# Patient Record
Sex: Male | Born: 1970 | Race: Black or African American | Hispanic: No | Marital: Married | Smoking: Never smoker
Health system: Southern US, Community
[De-identification: ages and names within clinical notes are randomized; demographics above are authoritative.]

## PROBLEM LIST (undated history)

## (undated) DIAGNOSIS — I3139 Other pericardial effusion (noninflammatory): Secondary | ICD-10-CM

## (undated) DIAGNOSIS — K219 Gastro-esophageal reflux disease without esophagitis: Secondary | ICD-10-CM

## (undated) DIAGNOSIS — I5020 Unspecified systolic (congestive) heart failure: Secondary | ICD-10-CM

## (undated) DIAGNOSIS — I313 Pericardial effusion (noninflammatory): Secondary | ICD-10-CM

## (undated) DIAGNOSIS — J45909 Unspecified asthma, uncomplicated: Secondary | ICD-10-CM

## (undated) HISTORY — DX: Unspecified asthma, uncomplicated: J45.909

## (undated) HISTORY — PX: OTHER SURGICAL HISTORY: SHX169

---

## 2020-01-06 ENCOUNTER — Ambulatory Visit: Payer: Self-pay | Admitting: Nurse Practitioner

## 2020-01-15 ENCOUNTER — Other Ambulatory Visit: Payer: Self-pay | Admitting: Nurse Practitioner

## 2020-01-15 ENCOUNTER — Other Ambulatory Visit: Payer: Self-pay

## 2020-01-15 ENCOUNTER — Ambulatory Visit (INDEPENDENT_AMBULATORY_CARE_PROVIDER_SITE_OTHER): Payer: Managed Care, Other (non HMO) | Admitting: Nurse Practitioner

## 2020-01-15 ENCOUNTER — Encounter: Payer: Self-pay | Admitting: Nurse Practitioner

## 2020-01-15 ENCOUNTER — Telehealth: Payer: Self-pay

## 2020-01-15 DIAGNOSIS — Z0000 Encounter for general adult medical examination without abnormal findings: Secondary | ICD-10-CM | POA: Insufficient documentation

## 2020-01-15 DIAGNOSIS — Z139 Encounter for screening, unspecified: Secondary | ICD-10-CM

## 2020-01-15 DIAGNOSIS — Z7689 Persons encountering health services in other specified circumstances: Secondary | ICD-10-CM | POA: Diagnosis not present

## 2020-01-15 DIAGNOSIS — R000 Tachycardia, unspecified: Secondary | ICD-10-CM

## 2020-01-15 DIAGNOSIS — R609 Edema, unspecified: Secondary | ICD-10-CM | POA: Diagnosis not present

## 2020-01-15 MED ORDER — FUROSEMIDE 20 MG PO TABS: 20.0000 mg | ORAL_TABLET | Freq: Every day | ORAL | 0 refills | Status: DC

## 2020-01-15 NOTE — Assessment & Plan Note (Signed)
-  will get labs including HCV and HIV screening prior to next visit

## 2020-01-15 NOTE — Assessment & Plan Note (Signed)
-  will set up for colonoscopy

## 2020-01-15 NOTE — Progress Notes (Signed)
New Patient Office Visit  Subjective:  Patient ID: Craig Hanson, male    DOB: 1970-04-21  Age: 49 y.o. MRN: [id number]  CC:  Chief Complaint  Patient presents with  . Establish Care  . Leg Swelling    For the past few weeks especially worse in the evening after work and goes down overnight but then comes back   . Shortness of Breath    States since the 2nd covid vaccine he does give out of breath more easily when walking     HPI Craig Hanson presents for new patient visit. No recent PCP. No recent labs or physical. He had second COVID shot on 11/18/19, and he states he has had fatigue with exertion as well as leg swelling since then. His BP is elevated today. He had scrotal edema, and today he has pitting edema up to his mid thigh.  No acute distress or chest pain today, no episode of chest pain.   Past Medical History:  Diagnosis Date  . Asthma     History reviewed. No pertinent surgical history.  Family History  Problem Relation Age of Onset  . Hypertension Mother   . Heart disease Father     Social History   Socioeconomic History  . Marital status: Married    Spouse name: Not on file  . Number of children: Not on file  . Years of education: Not on file  . 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 Smoking status: Never Smoker  . Smokeless tobacco: Never Used  Vaping Use  . Vaping Use: Never used  Substance and Sexual Activity  . Alcohol use: Yes    Comment: 1 gallon of vodka lasts 2 weeks  . Drug use: Never  . Sexual activity: Yes    Birth control/protection: None  Other Topics Concern  . Not on file  Social History Narrative  . Not on file   Social Determinants of Health   Financial Resource Strain: Not on file  Food Insecurity: Not on file  Transportation Needs: Not on file  Physical Activity: Not on file  Stress: Not on file  Social Connections: Not  on file  Intimate Partner Violence: Not on file    ROS Review of Systems  Constitutional: Negative.   Respiratory: Negative.        SOB with exertion; no acute distress today  Cardiovascular: Positive for leg swelling. Negative for chest pain and palpitations.       Swelling from his feet to his scrotum  Musculoskeletal: Negative.   Psychiatric/Behavioral: Negative.     Objective:   Today's Vitals: BP (!) 145/97   Pulse (!) 123   Resp 16   Ht $R'5\' 8"'SM$  (1.727 m)   Wt 242 lb (109.8 kg)   SpO2 98%   BMI 36.80 kg/m   Physical Exam Constitutional:      Appearance: He is obese.  Cardiovascular:     Rate and Rhythm: Regular rhythm. Tachycardia present.     Comments: Left leg is weeping from a small abrasion. Pulmonary:     Effort: Pulmonary effort is normal.     Breath sounds: Normal breath sounds.  Musculoskeletal:     Right lower leg: No tenderness. Edema present.     Left lower leg: No tenderness. Edema present.  Neurological:     Mental Status: He is alert.     Assessment & Plan:  Problem List Items Addressed This Visit      Other   Encounter to establish care    -will get labs including HCV and HIV screening prior to next visit      Relevant Orders   CBC with Differential/Platelet   CMP14+EGFR   HCV Ab w/Rflx to Verification   HIV Antibody (routine testing w rflx)   Lipid Panel With LDL/HDL Ratio   TSH + free T4   3+ pitting edema    -to bilateral legs up to mid thigh -unsure of etiology- cardiac vs renal -BP elevated today -Rx. Lasix -will get labs and perform physical next week      Screening due    -will set up for colonoscopy      Relevant Orders   Ambulatory referral to Gastroenterology   HCV Ab w/Rflx to Verification   HIV Antibody (routine testing w rflx)   Tachycardia    -HR 123 today -has edema, no crackles auscultated -Rx. Lasix, re-evaluate at time of physcial -if he has any distress, report to ED      Relevant Orders   TSH +  free T4      Outpatient Encounter Medications as of 01/15/2020  Medication Sig  . furosemide (LASIX) 20 MG tablet Take 1 tablet (20 mg total) by mouth daily.   No facility-administered encounter medications on file as of 01/15/2020.    Follow-up: Return in about 1 week (around 01/22/2020) for Physical Exam.   Noreene Larsson, NP

## 2020-01-15 NOTE — Assessment & Plan Note (Addendum)
-  HR 123 today -has edema, no crackles auscultated -Rx. Lasix, re-evaluate at time of physcial -if he has any distress, report to ED -EKG today showed sinus tachycardia and possible left atrial enlargement -cardiology consulted

## 2020-01-15 NOTE — Assessment & Plan Note (Signed)
-  to bilateral legs up to mid thigh -unsure of etiology- cardiac vs renal -BP elevated today -Rx. Lasix -will get labs and perform physical next week

## 2020-01-15 NOTE — Patient Instructions (Addendum)
For your new patient visit, we set you up with routine labs.  For leg swelling, I prescribed furosemide to remove some fluid.  We will use the labs to decide our next steps. The EKG

## 2020-01-15 NOTE — Telephone Encounter (Signed)
Needs meds sent to [facility].   [facility] [place]    Furosemide 20 mg

## 2020-01-19 ENCOUNTER — Encounter: Payer: Self-pay | Admitting: Internal Medicine

## 2020-01-19 NOTE — Telephone Encounter (Signed)
Patient said he wanted med to go to mail order. [facility]

## 2020-01-22 ENCOUNTER — Ambulatory Visit (INDEPENDENT_AMBULATORY_CARE_PROVIDER_SITE_OTHER): Payer: Managed Care, Other (non HMO) | Admitting: Nurse Practitioner

## 2020-01-22 ENCOUNTER — Encounter: Payer: Self-pay | Admitting: Nurse Practitioner

## 2020-01-22 ENCOUNTER — Other Ambulatory Visit: Payer: Self-pay

## 2020-01-22 DIAGNOSIS — R000 Tachycardia, unspecified: Secondary | ICD-10-CM

## 2020-01-22 DIAGNOSIS — R609 Edema, unspecified: Secondary | ICD-10-CM | POA: Diagnosis not present

## 2020-01-22 DIAGNOSIS — Z0000 Encounter for general adult medical examination without abnormal findings: Secondary | ICD-10-CM | POA: Diagnosis not present

## 2020-01-22 MED ORDER — POTASSIUM CHLORIDE CRYS ER 20 MEQ PO TBCR: 20.0000 meq | EXTENDED_RELEASE_TABLET | Freq: Every day | ORAL | 3 refills | Status: DC

## 2020-01-22 MED ORDER — FUROSEMIDE 40 MG PO TABS: 40.0000 mg | ORAL_TABLET | Freq: Every day | ORAL | 3 refills | Status: DC

## 2020-01-22 NOTE — Patient Instructions (Signed)
We will meet back up in 2 weeks to check on your leg swelling.

## 2020-01-22 NOTE — Progress Notes (Signed)
Established Patient Office Visit  Subjective:  Patient ID: Craig Hanson, male    DOB: 01/16/1971  Age: 50 y.o. MRN: [id number]  CC:  Chief Complaint  Patient presents with  . Annual Exam    HPI Craig Hanson presents for yearly physical exam.  He states that his edema has improved some, but it is still present.  He was referred to GI and cardiology at last OV.  He had his labs drawn today.  Past Medical History:  Diagnosis Date  . Asthma     History reviewed. No pertinent surgical history.  Family History  Problem Relation Age of Onset  . Hypertension Mother   . Heart disease Father     Social History   Socioeconomic History  . Marital status: Married    Spouse name: Not on file  . Number of children: Not on file  . Years of education: Not on file  . 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 Smoking status: Never Smoker  . Smokeless tobacco: Never Used  Vaping Use  . Vaping Use: Never used  Substance and Sexual Activity  . Alcohol use: Yes    Comment: 1 gallon of vodka lasts 2 weeks  . Drug use: Never  . Sexual activity: Yes    Birth control/protection: None  Other Topics Concern  . Not on file  Social History Narrative  . Not on file   Social Determinants of Health   Financial Resource Strain: Not on file  Food Insecurity: Not on file  Transportation Needs: Not on file  Physical Activity: Not on file  Stress: Not on file  Social Connections: Not on file  Intimate Partner Violence: Not on file    Outpatient Medications Prior to Visit  Medication Sig Dispense Refill  . furosemide (LASIX) 20 MG tablet Take 1 tablet (20 mg total) by mouth daily. 14 tablet 0   No facility-administered medications prior to visit.    Not on File  ROS Review of Systems  Constitutional: Positive for fatigue. Negative for chills and fever.  HENT: Negative.   Eyes: Negative.    Respiratory: Positive for shortness of breath. Negative for cough, chest tightness and wheezing.        SOB with exertion  Cardiovascular: Positive for leg swelling. Negative for chest pain and palpitations.  Gastrointestinal: Positive for abdominal distention. Negative for anal bleeding, blood in stool, constipation, diarrhea, nausea and vomiting.  Endocrine: Negative.   Genitourinary: Positive for scrotal swelling.  Skin: Negative.   Allergic/Immunologic: Negative.   Neurological: Negative.   Hematological: Negative.   Psychiatric/Behavioral: Negative.       Objective:    Physical Exam Constitutional:      Appearance: He is obese.  HENT:     Head: Normocephalic and atraumatic.     Right Ear: Tympanic membrane, ear canal and external ear normal.     Left Ear: Tympanic membrane, ear canal and external ear normal.     Nose: Nose normal.     Mouth/Throat:     Mouth: Mucous membranes are dry.     Pharynx: Oropharynx is clear.     Comments: Wears upper dentures Eyes:     Extraocular Movements: Extraocular movements intact.     Conjunctiva/sclera: Conjunctivae normal.     Pupils: Pupils are equal, round, and reactive to light.  Cardiovascular:     Rate and Rhythm: Normal rate and  regular rhythm.     Pulses: Normal pulses.     Heart sounds: Normal heart sounds.  Pulmonary:     Effort: Pulmonary effort is normal.     Breath sounds: Normal breath sounds.  Abdominal:     General: Abdomen is flat. Bowel sounds are normal.     Palpations: Abdomen is soft.  Musculoskeletal:        General: Normal range of motion.     Cervical back: Normal range of motion and neck supple.  Skin:    General: Skin is warm and dry.     Capillary Refill: Capillary refill takes less than 2 seconds.  Neurological:     General: No focal deficit present.     Mental Status: He is alert and oriented to person, place, and time.  Psychiatric:        Mood and Affect: Mood normal.        Behavior:  Behavior normal.        Thought Content: Thought content normal.        Judgment: Judgment normal.     BP (!) 148/86   Pulse (!) 126   Temp 98.4 F (36.9 C)   Resp (!) 22   Ht $R'5\' 8"'Fz$  (1.727 m)   Wt 253 lb (114.8 kg)   SpO2 93%   BMI 38.47 kg/m  Wt Readings from Last 3 Encounters:  01/22/20 253 lb (114.8 kg)  01/15/20 242 lb (109.8 kg)     Health Maintenance Due  Topic Date Due  . Hepatitis C Screening  Never done  . HIV Screening  Never done  . COLONOSCOPY (Pts 45-48yrs Insurance coverage will need to be confirmed)  Never done  . INFLUENZA VACCINE  Never done    There are no preventive care reminders to display for this patient.  No results found for: TSH No results found for: WBC, HGB, HCT, MCV, PLT No results found for: NA, K, CHLORIDE, CO2, GLUCOSE, BUN, CREATININE, BILITOT, ALKPHOS, AST, ALT, PROT, ALBUMIN, CALCIUM, ANIONGAP, EGFR, GFR No results found for: CHOL No results found for: HDL No results found for: LDLCALC No results found for: TRIG No results found for: CHOLHDL No results found for: HGBA1C    Assessment & Plan:   Problem List Items Addressed This Visit      Other   Routine physical examination    -had his labs drawn today -has upcoming colonoscopy      3+ pitting edema    -still has 3+ edema to BLE; 2+ pittign to thighs and abdomen -INCREASE lasix to 40 mEq per day -Rx. K -f/u in 2 weeks      Tachycardia    -likely related to his edema -referred to cardiology at last OV -will check thyroid with next set of labs         Meds ordered this encounter  Medications  . furosemide (LASIX) 40 MG tablet    Sig: Take 1 tablet (40 mg total) by mouth daily.    Dispense:  30 tablet    Refill:  3  . potassium chloride SA (KLOR-CON) 20 MEQ tablet    Sig: Take 1 tablet (20 mEq total) by mouth daily.    Dispense:  30 tablet    Refill:  3    Follow-up: Return in about 2 weeks (around 02/05/2020) for Medication check (lasix).    Noreene Larsson, NP

## 2020-01-22 NOTE — Assessment & Plan Note (Signed)
-  still has 3+ edema to BLE; 2+ pittign to thighs and abdomen -INCREASE lasix to 40 mEq per day -Rx. K -f/u in 2 weeks

## 2020-01-22 NOTE — Assessment & Plan Note (Addendum)
-  likely related to his edema -referred to cardiology at last OV -will check thyroid with next set of labs

## 2020-01-22 NOTE — Assessment & Plan Note (Signed)
-  had his labs drawn today -has upcoming colonoscopy

## 2020-01-23 LAB — TSH+FREE T4
Free T4: 1.26 ng/dL (ref 0.82–1.77)
TSH: 3.73 u[IU]/mL (ref 0.450–4.500)

## 2020-01-23 LAB — CMP14+EGFR
ALT: 69 IU/L — ABNORMAL HIGH (ref 0–44)
AST: 56 IU/L — ABNORMAL HIGH (ref 0–40)
Albumin/Globulin Ratio: 1.4 (ref 1.2–2.2)
Albumin: 3.8 g/dL (ref 3.8–4.9)
Alkaline Phosphatase: 135 IU/L — ABNORMAL HIGH (ref 44–121)
BUN/Creatinine Ratio: 11 (ref 9–20)
BUN: 15 mg/dL (ref 6–24)
Bilirubin Total: 1.1 mg/dL (ref 0.0–1.2)
CO2: 21 mmol/L (ref 20–29)
Calcium: 9.2 mg/dL (ref 8.7–10.2)
Chloride: 99 mmol/L (ref 96–106)
Creatinine, Ser: 1.33 mg/dL — ABNORMAL HIGH (ref 0.76–1.27)
GFR calc Af Amer: 71 mL/min/{1.73_m2} (ref >59)
GFR calc non Af Amer: 61 mL/min/{1.73_m2} (ref >59)
Globulin, Total: 2.8 g/dL (ref 1.5–4.5)
Glucose: 111 mg/dL — ABNORMAL HIGH (ref 65–99)
Potassium: 4.6 mmol/L (ref 3.5–5.2)
Sodium: 140 mmol/L (ref 134–144)
Total Protein: 6.6 g/dL (ref 6.0–8.5)

## 2020-01-23 LAB — LIPID PANEL WITH LDL/HDL RATIO
Cholesterol, Total: 150 mg/dL (ref 100–199)
HDL: 38 mg/dL — ABNORMAL LOW (ref >39)
LDL Chol Calc (NIH): 96 mg/dL (ref 0–99)
LDL/HDL Ratio: 2.5 ratio (ref 0.0–3.6)
Triglycerides: 84 mg/dL (ref 0–149)
VLDL Cholesterol Cal: 16 mg/dL (ref 5–40)

## 2020-01-23 LAB — HCV AB W/RFLX TO VERIFICATION: HCV Ab: <0.1 s/co ratio (ref 0.0–0.9)

## 2020-01-23 LAB — CBC WITH DIFFERENTIAL/PLATELET
Basophils Absolute: 0 10*3/uL (ref 0.0–0.2)
Basos: 0 %
EOS (ABSOLUTE): 0 10*3/uL (ref 0.0–0.4)
Eos: 0 %
Hematocrit: 43.3 % (ref 37.5–51.0)
Hemoglobin: 14.4 g/dL (ref 13.0–17.7)
Immature Grans (Abs): 0 10*3/uL (ref 0.0–0.1)
Immature Granulocytes: 0 %
Lymphocytes Absolute: 1.2 10*3/uL (ref 0.7–3.1)
Lymphs: 12 %
MCH: 30.8 pg (ref 26.6–33.0)
MCHC: 33.3 g/dL (ref 31.5–35.7)
MCV: 93 fL (ref 79–97)
Monocytes Absolute: 0.9 10*3/uL (ref 0.1–0.9)
Monocytes: 9 %
Neutrophils Absolute: 7.9 10*3/uL — ABNORMAL HIGH (ref 1.4–7.0)
Neutrophils: 79 %
Platelets: 292 10*3/uL (ref 150–450)
RBC: 4.67 x10E6/uL (ref 4.14–5.80)
RDW: 13.9 % (ref 11.6–15.4)
WBC: 10.1 10*3/uL (ref 3.4–10.8)

## 2020-01-23 LAB — HIV ANTIBODY (ROUTINE TESTING W REFLEX): HIV Screen 4th Generation wRfx: NONREACTIVE

## 2020-01-23 LAB — HCV INTERPRETATION

## 2020-01-23 NOTE — Progress Notes (Signed)
We'll discuss this at his next OV.

## 2020-02-05 ENCOUNTER — Ambulatory Visit (INDEPENDENT_AMBULATORY_CARE_PROVIDER_SITE_OTHER): Payer: Managed Care, Other (non HMO) | Admitting: Nurse Practitioner

## 2020-02-05 ENCOUNTER — Encounter: Payer: Self-pay | Admitting: Nurse Practitioner

## 2020-02-05 ENCOUNTER — Other Ambulatory Visit: Payer: Self-pay

## 2020-02-05 DIAGNOSIS — R609 Edema, unspecified: Secondary | ICD-10-CM

## 2020-02-05 MED ORDER — POTASSIUM CHLORIDE CRYS ER 20 MEQ PO TBCR: 20.0000 meq | EXTENDED_RELEASE_TABLET | Freq: Two times a day (BID) | ORAL | 0 refills | Status: DC

## 2020-02-05 MED ORDER — FUROSEMIDE 40 MG PO TABS: 40.0000 mg | ORAL_TABLET | Freq: Two times a day (BID) | ORAL | 0 refills | Status: DC

## 2020-02-05 NOTE — Patient Instructions (Signed)
We will meet back up in 2 weeks to recheck your edema. We will draw labs at that time to recheck your electrolytes and kidney function.

## 2020-02-05 NOTE — Assessment & Plan Note (Signed)
-  his pitting edema is still 3+; his left leg is weeping -INCREASED lasix to 40 mg BID -he will see cardiology tomorrow -Suspect heart failure given decent renal function -will defer to them for ACEi/ARB and or Beta-blocker since he will see them tomorrow

## 2020-02-05 NOTE — Progress Notes (Signed)
Established Patient Office Visit  Subjective:  Patient ID: Craig Hanson, male    DOB: 10-29-1970  Age: 50 y.o. MRN: [id number]  CC:  Chief Complaint  Patient presents with  . Edema  . Follow-up  . Shortness of Breath    HPI RAVEN HARMES presents for bilateral leg edema. His swelling is about the same as his previous visit. He has been taking furosemide as prescribed and states his urine output has increased.  Past Medical History:  Diagnosis Date  . Asthma     History reviewed. No pertinent surgical history.  Family History  Problem Relation Age of Onset  . Hypertension Mother   . Heart disease Father     Social History   Socioeconomic History  . Marital status: Married    Spouse name: Not on file  . Number of children: Not on file  . Years of education: Not on file  . 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 Smoking status: Never Smoker  . Smokeless tobacco: Never Used  Vaping Use  . Vaping Use: Never used  Substance and Sexual Activity  . Alcohol use: Yes    Comment: 1 gallon of vodka lasts 2 weeks  . Drug use: Never  . Sexual activity: Yes    Birth control/protection: None  Other Topics Concern  . Not on file  Social History Narrative  . Not on file   Social Determinants of Health   Financial Resource Strain: Not on file  Food Insecurity: Not on file  Transportation Needs: Not on file  Physical Activity: Not on file  Stress: Not on file  Social Connections: Not on file  Intimate Partner Violence: Not on file    Outpatient Medications Prior to Visit  Medication Sig Dispense Refill  . furosemide (LASIX) 40 MG tablet Take 1 tablet (40 mg total) by mouth daily. 30 tablet 3  . potassium chloride SA (KLOR-CON) 20 MEQ tablet Take 1 tablet (20 mEq total) by mouth daily. 30 tablet 3   No facility-administered medications prior to visit.    Not on  File  ROS Review of Systems    Objective:    Physical Exam  BP (!) 125/91   Pulse (!) 125   Temp 98.4 F (36.9 C)   Resp (!) 22   Ht 5\' 8"  (1.727 m)   Wt 231 lb (104.8 kg)   SpO2 95%   BMI 35.12 kg/m  Wt Readings from Last 3 Encounters:  02/05/20 231 lb (104.8 kg)  01/22/20 253 lb (114.8 kg)  01/15/20 242 lb (109.8 kg)     Health Maintenance Due  Topic Date Due  . COLONOSCOPY (Pts 45-91yrs Insurance coverage will need to be confirmed)  Never done  . INFLUENZA VACCINE  Never done    There are no preventive care reminders to display for this patient.  Lab Results  Component Value Date   TSH 3.730 01/22/2020   Lab Results  Component Value Date   WBC 10.1 01/22/2020   HGB 14.4 01/22/2020   HCT 43.3 01/22/2020   MCV 93 01/22/2020   PLT 292 01/22/2020   Lab Results  Component Value Date   NA 140 01/22/2020   K 4.6 01/22/2020   CO2 21 01/22/2020   GLUCOSE 111 (H) 01/22/2020   BUN 15 01/22/2020   CREATININE 1.33 (H) 01/22/2020   BILITOT 1.1 01/22/2020   ALKPHOS 135 (H)  01/22/2020   AST 56 (H) 01/22/2020   ALT 69 (H) 01/22/2020   PROT 6.6 01/22/2020   ALBUMIN 3.8 01/22/2020   CALCIUM 9.2 01/22/2020   Lab Results  Component Value Date   CHOL 150 01/22/2020   Lab Results  Component Value Date   HDL 38 (L) 01/22/2020   Lab Results  Component Value Date   LDLCALC 96 01/22/2020   Lab Results  Component Value Date   TRIG 84 01/22/2020   No results found for: CHOLHDL No results found for: EZMO2H    Assessment & Plan:   Problem List Items Addressed This Visit      Other   3+ pitting edema    -his pitting edema is still 3+; his left leg is weeping -INCREASED lasix to 40 mg BID -he will see cardiology tomorrow -Suspect heart failure given decent renal function -will defer to them for ACEi/ARB and or Beta-blocker since he will see them tomorrow      Relevant Orders   Basic Metabolic Panel (BMET)      Meds ordered this encounter   Medications  . furosemide (LASIX) 40 MG tablet    Sig: Take 1 tablet (40 mg total) by mouth 2 (two) times daily.    Dispense:  30 tablet    Refill:  0  . potassium chloride SA (KLOR-CON) 20 MEQ tablet    Sig: Take 1 tablet (20 mEq total) by mouth 2 (two) times daily. Administer medication when taking furosemide    Dispense:  30 tablet    Refill:  0    Follow-up: Return in about 2 weeks (around 02/19/2020) for Edema follow-up.    Heather Roberts, NP

## 2020-02-06 ENCOUNTER — Inpatient Hospital Stay (HOSPITAL_COMMUNITY)
Admission: AD | Admit: 2020-02-06 | Discharge: 2020-02-20 | DRG: 286 | Disposition: A | Discharge status: Home / Self Care | Payer: Managed Care, Other (non HMO) | Source: Ambulatory Visit | Attending: Internal Medicine | Admitting: Internal Medicine

## 2020-02-06 ENCOUNTER — Encounter (HOSPITAL_COMMUNITY): Payer: Self-pay | Admitting: Internal Medicine

## 2020-02-06 ENCOUNTER — Encounter: Payer: Self-pay | Admitting: Cardiology

## 2020-02-06 ENCOUNTER — Ambulatory Visit: Payer: Managed Care, Other (non HMO) | Admitting: Cardiology

## 2020-02-06 ENCOUNTER — Other Ambulatory Visit: Payer: Self-pay

## 2020-02-06 ENCOUNTER — Other Ambulatory Visit (HOSPITAL_COMMUNITY)
Admission: RE | Admit: 2020-02-06 | Discharge: 2020-02-06 | Disposition: A | Discharge status: Home / Self Care | Payer: Managed Care, Other (non HMO) | Source: Other Acute Inpatient Hospital | Attending: Family Medicine | Admitting: Family Medicine

## 2020-02-06 VITALS — BP 124/84 | HR 126 | Ht 68.0 in | Wt 247.0 lb

## 2020-02-06 DIAGNOSIS — I340 Nonrheumatic mitral (valve) insufficiency: Secondary | ICD-10-CM | POA: Diagnosis not present

## 2020-02-06 DIAGNOSIS — I313 Pericardial effusion (noninflammatory): Secondary | ICD-10-CM | POA: Diagnosis present

## 2020-02-06 DIAGNOSIS — I4901 Ventricular fibrillation: Secondary | ICD-10-CM | POA: Diagnosis not present

## 2020-02-06 DIAGNOSIS — I5023 Acute on chronic systolic (congestive) heart failure: Secondary | ICD-10-CM | POA: Diagnosis present

## 2020-02-06 DIAGNOSIS — E8779 Other fluid overload: Secondary | ICD-10-CM | POA: Diagnosis not present

## 2020-02-06 DIAGNOSIS — I509 Heart failure, unspecified: Secondary | ICD-10-CM

## 2020-02-06 DIAGNOSIS — I472 Ventricular tachycardia: Secondary | ICD-10-CM | POA: Diagnosis not present

## 2020-02-06 DIAGNOSIS — Z79899 Other long term (current) drug therapy: Secondary | ICD-10-CM | POA: Diagnosis not present

## 2020-02-06 DIAGNOSIS — I5082 Biventricular heart failure: Secondary | ICD-10-CM | POA: Diagnosis present

## 2020-02-06 DIAGNOSIS — Z8249 Family history of ischemic heart disease and other diseases of the circulatory system: Secondary | ICD-10-CM | POA: Diagnosis not present

## 2020-02-06 DIAGNOSIS — Z20822 Contact with and (suspected) exposure to covid-19: Secondary | ICD-10-CM | POA: Diagnosis present

## 2020-02-06 DIAGNOSIS — E8770 Fluid overload, unspecified: Secondary | ICD-10-CM

## 2020-02-06 DIAGNOSIS — I429 Cardiomyopathy, unspecified: Secondary | ICD-10-CM | POA: Diagnosis not present

## 2020-02-06 DIAGNOSIS — I428 Other cardiomyopathies: Secondary | ICD-10-CM | POA: Diagnosis present

## 2020-02-06 DIAGNOSIS — R188 Other ascites: Secondary | ICD-10-CM | POA: Diagnosis present

## 2020-02-06 DIAGNOSIS — I5021 Acute systolic (congestive) heart failure: Secondary | ICD-10-CM

## 2020-02-06 DIAGNOSIS — Z6835 Body mass index (BMI) 35.0-35.9, adult: Secondary | ICD-10-CM | POA: Diagnosis not present

## 2020-02-06 DIAGNOSIS — I081 Rheumatic disorders of both mitral and tricuspid valves: Secondary | ICD-10-CM | POA: Diagnosis present

## 2020-02-06 DIAGNOSIS — I5041 Acute combined systolic (congestive) and diastolic (congestive) heart failure: Secondary | ICD-10-CM | POA: Diagnosis not present

## 2020-02-06 DIAGNOSIS — E669 Obesity, unspecified: Secondary | ICD-10-CM

## 2020-02-06 DIAGNOSIS — K7460 Unspecified cirrhosis of liver: Secondary | ICD-10-CM | POA: Diagnosis present

## 2020-02-06 DIAGNOSIS — R0600 Dyspnea, unspecified: Secondary | ICD-10-CM | POA: Diagnosis not present

## 2020-02-06 DIAGNOSIS — R601 Generalized edema: Secondary | ICD-10-CM

## 2020-02-06 DIAGNOSIS — I361 Nonrheumatic tricuspid (valve) insufficiency: Secondary | ICD-10-CM | POA: Diagnosis not present

## 2020-02-06 DIAGNOSIS — F1010 Alcohol abuse, uncomplicated: Secondary | ICD-10-CM | POA: Diagnosis present

## 2020-02-06 DIAGNOSIS — R000 Tachycardia, unspecified: Secondary | ICD-10-CM

## 2020-02-06 HISTORY — DX: Pericardial effusion (noninflammatory): I31.3

## 2020-02-06 HISTORY — DX: Unspecified systolic (congestive) heart failure: I50.20

## 2020-02-06 HISTORY — DX: Other pericardial effusion (noninflammatory): I31.39

## 2020-02-06 HISTORY — DX: Heart failure, unspecified: I50.9

## 2020-02-06 LAB — CBC WITH DIFFERENTIAL/PLATELET
Abs Immature Granulocytes: 0.05 10*3/uL (ref 0.00–0.07)
Basophils Absolute: 0 10*3/uL (ref 0.0–0.1)
Basophils Relative: 0 %
Eosinophils Absolute: 0 10*3/uL (ref 0.0–0.5)
Eosinophils Relative: 0 %
HCT: 44 % (ref 39.0–52.0)
Hemoglobin: 14 g/dL (ref 13.0–17.0)
Immature Granulocytes: 1 %
Lymphocytes Relative: 8 %
Lymphs Abs: 0.8 10*3/uL (ref 0.7–4.0)
MCH: 30.6 pg (ref 26.0–34.0)
MCHC: 31.8 g/dL (ref 30.0–36.0)
MCV: 96.3 fL (ref 80.0–100.0)
Monocytes Absolute: 0.9 10*3/uL (ref 0.1–1.0)
Monocytes Relative: 10 %
Neutro Abs: 7.9 10*3/uL — ABNORMAL HIGH (ref 1.7–7.7)
Neutrophils Relative %: 81 %
Platelets: 299 10*3/uL (ref 150–400)
RBC: 4.57 MIL/uL (ref 4.22–5.81)
RDW: 16.4 % — ABNORMAL HIGH (ref 11.5–15.5)
WBC: 9.6 10*3/uL (ref 4.0–10.5)
nRBC: 0.4 % — ABNORMAL HIGH (ref 0.0–0.2)

## 2020-02-06 LAB — PROTIME-INR
INR: 1.5 — ABNORMAL HIGH (ref 0.8–1.2)
Prothrombin Time: 17.9 seconds — ABNORMAL HIGH (ref 11.4–15.2)

## 2020-02-06 LAB — COMPREHENSIVE METABOLIC PANEL
ALT: 45 U/L — ABNORMAL HIGH (ref 0–44)
AST: 50 U/L — ABNORMAL HIGH (ref 15–41)
Albumin: 3.2 g/dL — ABNORMAL LOW (ref 3.5–5.0)
Alkaline Phosphatase: 119 U/L (ref 38–126)
Anion gap: 11 (ref 5–15)
BUN: 23 mg/dL — ABNORMAL HIGH (ref 6–20)
CO2: 26 mmol/L (ref 22–32)
Calcium: 8.4 mg/dL — ABNORMAL LOW (ref 8.9–10.3)
Chloride: 96 mmol/L — ABNORMAL LOW (ref 98–111)
Creatinine, Ser: 1.11 mg/dL (ref 0.61–1.24)
GFR, Estimated: >60 mL/min (ref >60)
Glucose, Bld: 167 mg/dL — ABNORMAL HIGH (ref 70–99)
Potassium: 3.5 mmol/L (ref 3.5–5.1)
Sodium: 133 mmol/L — ABNORMAL LOW (ref 135–145)
Total Bilirubin: 1.4 mg/dL — ABNORMAL HIGH (ref 0.3–1.2)
Total Protein: 6.8 g/dL (ref 6.5–8.1)

## 2020-02-06 LAB — MAGNESIUM: Magnesium: 2.2 mg/dL (ref 1.7–2.4)

## 2020-02-06 LAB — BRAIN NATRIURETIC PEPTIDE: B Natriuretic Peptide: 2881 pg/mL — ABNORMAL HIGH (ref 0.0–100.0)

## 2020-02-06 LAB — PHOSPHORUS: Phosphorus: 3.4 mg/dL (ref 2.5–4.6)

## 2020-02-06 LAB — SARS CORONAVIRUS 2 BY RT PCR (HOSPITAL ORDER, PERFORMED IN ~~LOC~~ HOSPITAL LAB): SARS Coronavirus 2: NEGATIVE

## 2020-02-06 MED ORDER — ADULT MULTIVITAMIN W/MINERALS CH
1.0000 | ORAL_TABLET | Freq: Every day | ORAL | Status: DC
  Administered 2020-02-06 – 2020-02-20 (×15): 1 via ORAL
  Filled 2020-02-06 (×17): qty 1

## 2020-02-06 MED ORDER — THIAMINE HCL 100 MG/ML IJ SOLN
100.0000 mg | Freq: Every day | INTRAMUSCULAR | Status: DC
  Filled 2020-02-06 (×2): qty 2

## 2020-02-06 MED ORDER — METOPROLOL TARTRATE 25 MG PO TABS
25.0000 mg | ORAL_TABLET | Freq: Two times a day (BID) | ORAL | Status: DC
  Administered 2020-02-06 – 2020-02-08 (×4): 25 mg via ORAL
  Filled 2020-02-06 (×4): qty 1

## 2020-02-06 MED ORDER — POTASSIUM CHLORIDE CRYS ER 20 MEQ PO TBCR
40.0000 meq | EXTENDED_RELEASE_TABLET | Freq: Once | ORAL | Status: AC
  Administered 2020-02-06: 40 meq via ORAL
  Filled 2020-02-06: qty 2

## 2020-02-06 MED ORDER — ACETAMINOPHEN 325 MG PO TABS
650.0000 mg | ORAL_TABLET | ORAL | Status: DC | PRN
  Administered 2020-02-08 – 2020-02-10 (×2): 650 mg via ORAL
  Filled 2020-02-06 (×2): qty 2

## 2020-02-06 MED ORDER — THIAMINE HCL 100 MG PO TABS
100.0000 mg | ORAL_TABLET | Freq: Every day | ORAL | Status: DC
  Administered 2020-02-07 – 2020-02-20 (×14): 100 mg via ORAL
  Filled 2020-02-06 (×14): qty 1

## 2020-02-06 MED ORDER — LORAZEPAM 2 MG/ML IJ SOLN: 1.0000 mg | INTRAMUSCULAR | Status: AC | PRN

## 2020-02-06 MED ORDER — LORAZEPAM 1 MG PO TABS: 1.0000 mg | ORAL_TABLET | ORAL | Status: AC | PRN

## 2020-02-06 MED ORDER — SODIUM CHLORIDE 0.9% FLUSH
3.0000 mL | INTRAVENOUS | Status: DC | PRN
  Administered 2020-02-10: 3 mL via INTRAVENOUS

## 2020-02-06 MED ORDER — SODIUM CHLORIDE 0.9% FLUSH
3.0000 mL | Freq: Two times a day (BID) | INTRAVENOUS | Status: DC
  Administered 2020-02-06 – 2020-02-12 (×11): 3 mL via INTRAVENOUS

## 2020-02-06 MED ORDER — SODIUM CHLORIDE 0.9 % IV SOLN: 250.0000 mL | INTRAVENOUS | Status: DC | PRN

## 2020-02-06 MED ORDER — ONDANSETRON HCL 4 MG/2ML IJ SOLN: 4.0000 mg | Freq: Four times a day (QID) | INTRAMUSCULAR | Status: DC | PRN

## 2020-02-06 MED ORDER — FUROSEMIDE 10 MG/ML IJ SOLN
60.0000 mg | Freq: Two times a day (BID) | INTRAMUSCULAR | Status: DC
  Administered 2020-02-06 – 2020-02-09 (×6): 60 mg via INTRAVENOUS
  Filled 2020-02-06 (×6): qty 6

## 2020-02-06 MED ORDER — FOLIC ACID 1 MG PO TABS
1.0000 mg | ORAL_TABLET | Freq: Every day | ORAL | Status: DC
  Administered 2020-02-07 – 2020-02-20 (×14): 1 mg via ORAL
  Filled 2020-02-06 (×14): qty 1

## 2020-02-06 MED ORDER — HEPARIN SODIUM (PORCINE) 5000 UNIT/ML IJ SOLN
5000.0000 [IU] | Freq: Three times a day (TID) | INTRAMUSCULAR | Status: DC
  Administered 2020-02-07 – 2020-02-13 (×19): 5000 [IU] via SUBCUTANEOUS
  Filled 2020-02-06 (×18): qty 1

## 2020-02-06 NOTE — H&P (Signed)
TRH H&P   Patient Demographics:    Craig Hanson, is a 50 y.o. male  MRN: [id number]   DOB - 1970/05/31  Admit Date - 02/06/2020  Outpatient Primary MD for the patient is Heather Roberts, NP  Referring MD/NP/PA: Dr Diona Browner  Outpatient Specialists: cardiology Dr Diona Browner  Patient coming from: Direct admission from Cardiology office  No chief complaint on file.     HPI:    Craig Hanson  is a 50 y.o. male, past medical history of asthma childhood, alcohol abuse, patient Care with PCP in December 2021, who referred him for cardiology evaluation, patient reports shortness of breath, orthopnea, progressive over last 64-month, as well shortness of breath, mainly orthopnea, currently exertional as well, progressive as well, he was sent to cardiology for further evaluation, he was prescribed Lasix, despite that he continues to have worsening edema, patient reports history of alcohol abuse, used to drink up to 12 beers in a day, reports he cut back on that, he is only drinking hard liquor now, vodka, 3-4 shots about 3 nights a week, report he is cutting back on that, denies fever, chills, chest pain. -In cardiology office patient was noted to have significant edema in lower extremity, scrotal edema, abdominal wall edema, so direct admission was requested for further work-up and diuresis.    Review of systems:    In addition to the HPI above,  No Fever-chills, No Headache, No changes with Vision or hearing, No problems swallowing food or Liquids, No Chest pain, Cough patient reports shortness of breath, orthopnea, fluid retention. No Abdominal pain, No Nausea or Vommitting, Bowel movements are regular, No Blood in stool or Urine, No dysuria, No new skin rashes or bruises, No new joints pains-aches,  No new weakness, tingling, numbness in any extremity, No recent weight gain or  loss, No polyuria, polydypsia or polyphagia, No significant Mental Stressors.  A full 10 point Review of Systems was done, except as stated above, all other Review of Systems were negative.   With Past History of the following :    Past Medical History:  Diagnosis Date  . Asthma       Past Surgical History:  Procedure Laterality Date  . No prior surgery        Social History:     Social History   Tobacco Use  . Smoking status: Never Smoker  . Smokeless tobacco: Never Used  Substance Use Topics  . Alcohol use: Yes    Comment: 1 gallon of vodka lasts 2 weeks       Family History :     Family History  Problem Relation Age of Onset  . Hypertension Mother   . Heart disease Father      Home Medications:   Prior to Admission medications   Medication Sig Start Date End Date Taking? Authorizing Provider  furosemide (LASIX) 40 MG tablet Take  1 tablet (40 mg total) by mouth 2 (two) times daily. 02/05/20  Yes Heather Roberts, NP  potassium chloride SA (KLOR-CON) 20 MEQ tablet Take 1 tablet (20 mEq total) by mouth 2 (two) times daily. Administer medication when taking furosemide 02/05/20  Yes Heather Roberts, NP     Allergies:    No Known Allergies   Physical Exam:   Vitals  Blood pressure (!) 138/101, pulse (!) 119, temperature 98.2 F (36.8 C), temperature source Oral, resp. rate 20, height 5\' 8"  (1.727 m), weight 111.1 kg, SpO2 97 %.   1. General well developed male, laying in bed, no apparent distress  2. Normal affect and insight, Not Suicidal or Homicidal, Awake Alert, Oriented X 3.  3. No F.N deficits, ALL C.Nerves Intact, Strength 5/5 all 4 extremities, Sensation intact all 4 extremities, Plantars down going.  4. Ears and Eyes appear Normal, Conjunctivae clear, PERRLA. Moist Oral Mucosa.  5. Supple Neck, No JVD, No cervical lymphadenopathy appriciated, No Carotid Bruits.  6. Symmetrical Chest wall movement, Good air movement bilaterally, no  wheezing  7.  Rate and rhythm, No Gallops, Rubs or Murmurs, No Parasternal Heave, +3 edema all the way to upper thighs, as well some abdominal wall edema.  8. Positive Bowel Sounds, [place] with evidence of some ascites, no rebound, no guarding, no rigidity .  abdomen Soft, No tenderness, No organomegaly appriciated,No rebound -guarding or rigidity.  9.  No Cyanosis, Normal Skin Turgor, No Skin Rash or Bruise.  10. Good muscle tone,  joints appear normal , no effusions, Normal ROM.  11. No Palpable Lymph Nodes in Neck or Axillae     Data Review:    CBC No results for input(s): WBC, HGB, HCT, PLT, MCV, MCH, MCHC, RDW, LYMPHSABS, MONOABS, EOSABS, BASOSABS, BANDABS in the last 168 hours.  Invalid input(s): NEUTRABS, BANDSABD ------------------------------------------------------------------------------------------------------------------  Chemistries  No results for input(s): NA, K, CL, CO2, GLUCOSE, BUN, CREATININE, CALCIUM, MG, AST, ALT, ALKPHOS, BILITOT in the last 168 hours.  Invalid input(s): GFRCGP ------------------------------------------------------------------------------------------------------------------ estimated creatinine clearance is 79.5 mL/min (A) (by C-G formula based on SCr of 1.33 mg/dL (H)). ------------------------------------------------------------------------------------------------------------------ No results for input(s): TSH, T4TOTAL, T3FREE, THYROIDAB in the last 72 hours.  Invalid input(s): FREET3  Coagulation profile No results for input(s): INR, PROTIME in the last 168 hours. ------------------------------------------------------------------------------------------------------------------- No results for input(s): DDIMER in the last 72 hours. -------------------------------------------------------------------------------------------------------------------  Cardiac Enzymes No results for input(s): CKMB, TROPONINI, MYOGLOBIN in the last 168  hours.  Invalid input(s): CK ------------------------------------------------------------------------------------------------------------------ No results found for: BNP   ---------------------------------------------------------------------------------------------------------------  Urinalysis No results found for: COLORURINE, APPEARANCEUR, LABSPEC, PHURINE, GLUCOSEU, HGBUR, BILIRUBINUR, KETONESUR, PROTEINUR, UROBILINOGEN, NITRITE, LEUKOCYTESUR  ----------------------------------------------------------------------------------------------------------------   Imaging Results:    No results found.  My personal review of   Assessment & Plan:    Active Problems:   Volume overload   Acute CHF (congestive heart failure) (HCC)   Volume overload -Patient with significant evidence of volume overload, +3 edema all the way up to the upper thighs, and some ascites, some crackles at the bases, no significant orthopnea, and weight gain, fluid retention. -Either related to cardiac or hepatic origin. -We will obtain 2D echo, monitor on telemetry, obtain EKG, and BNP, will be in need of IV diuresis, which will be started after obtaining his labs and ensure stable renal function. -Need to be evaluated for liver disease as well in the setting of alcohol abuse, and some ascites, will obtain CMP, will obtain complete abdominal ultrasound to  evaluate for liver disease and ascites, will obtain INR as well. -There under CHF pathway, continue with fluid restriction, daily weight, strict ins and outs.  History of alcohol abuse -Reports he did cut back, he will be kept on CIWA protocol.  DVT Prophylaxis Heparin -  AM Labs Ordered, also please review Full Orders  Family Communication: Admission, patients condition and plan of care including tests being ordered have been discussed with the patient and wife at bedside who indicate understanding and agree with the plan and Code Status.  Code  Status Full  Likely DC to  home  Condition GUARDED    Consults called: sent by cardiology    Admission status: inpatient    Time spent in minutes : 55 minutes   Huey Bienenstock M.D on 02/06/2020 at 5:06 PM   [facility] - Office  [phone]

## 2020-02-06 NOTE — Patient Instructions (Signed)
Patient is direct admit to room 320

## 2020-02-06 NOTE — Progress Notes (Signed)
Cardiology Office Note  Date: 02/06/2020   ID: Craig Hanson, DOB Apr 13, 1970, MRN [id number]  PCP:  Heather Roberts, NP  Cardiologist:  Nona Dell, MD Electrophysiologist:  None   Chief Complaint  Patient presents with   Shortness of Breath   Leg Swelling    History of Present Illness: Craig Hanson is a 50 y.o. male referred for cardiology consultation by Mr. Wallace Cullens NP for the evaluation of leg swelling, shortness of breath and tachycardia.  I reviewed the recent chart notes.  He recently established care at [facility] in late December 2021, reporting shortness of breath at that time also with significant leg swelling and scrotal edema.  Reports worsening in the symptoms since second COVID-19 vaccine in early November 2021.  He was prescribed Lasix with lab work obtained as noted below.  Otherwise no cardiac structural testing or chest x-ray.  He is here today with his wife.  He states that he was in good health prior to November, prior history of asthma, but no breakthrough events since childhood.  On no regular medications.  He does state that he is a regular alcohol drinker, previously as much as 12 beers in a day, also vodka shots in the evenings, he has cut this back since the above symptoms.  He does not feel any sense of rapid heart rate, his heart rate has been consistently in the 120s on recent health encounters.  On review of his ECG, although sinus tachycardia is possible, cannot exclude atypical atrial flutter or an atrial tachycardia with 2:1 block.  He also has rightward axis and IVCD.  His weight is actually increased since last assessment by PCP despite an increased dose of Lasix.  He has not had follow-up lab work since January 6. He reports NYHA class III dyspnea at this time.  T  Typically works Dietitian, lives in [place].  Past Medical History:  Diagnosis Date   Asthma     Past Surgical History:  Procedure  Laterality Date   No prior surgery      Current Outpatient Medications  Medication Sig Dispense Refill   furosemide (LASIX) 40 MG tablet Take 1 tablet (40 mg total) by mouth 2 (two) times daily. 30 tablet 0   potassium chloride SA (KLOR-CON) 20 MEQ tablet Take 1 tablet (20 mEq total) by mouth 2 (two) times daily. Administer medication when taking furosemide 30 tablet 0   No current facility-administered medications for this visit.   Allergies:  Patient has no known allergies.   Social History: The patient  reports that he has never smoked. He has never used smokeless tobacco. He reports current alcohol use. He reports that he does not use drugs.   Family History: The patient's family history includes Heart disease in his father; Hypertension in his mother.   ROS: No sense of palpitations or syncope.  Increased abdominal girth.  Physical Exam: VS:  BP 124/84 (BP Location: Right Arm)    Pulse (!) 126    Ht 5\' 8"  (1.727 m)    Wt 247 lb (112 kg)    SpO2 98%    BMI 37.56 kg/m , BMI Body mass index is 37.56 kg/m.  Wt Readings from Last 3 Encounters:  02/06/20 247 lb (112 kg)  02/05/20 231 lb (104.8 kg)  01/22/20 253 lb (114.8 kg)    General: Patient in no distress, but short of breath walking from room to room. HEENT: Conjunctiva and lids normal, wearing  a mask. Neck: Supple, elevated JVP noted, no carotid bruits. Lungs: Clear to auscultation, nonlabored breathing at rest. Cardiac: Indistinct PMI, RRR without obvious gallop, soft systolic murmur. Abdomen: Protuberant, nontender, bowel sounds present. Extremities: 3+ pitting edema up to the thighs. Skin: Warm and dry. Musculoskeletal: No kyphosis. Neuropsychiatric: Alert and oriented x3, affect grossly appropriate.  ECG:  An ECG dated 01/15/2020 was personally reviewed today and demonstrated:  Probable sinus tachycardia with left bundle branch block, rightward axis.  Recent Labwork: 01/22/2020: ALT 69; AST 56; BUN 15; Creatinine,  Ser 1.33; Hemoglobin 14.4; Platelets 292; Potassium 4.6; Sodium 140; TSH 3.730     Component Value Date/Time   CHOL 150 01/22/2020 1012   TRIG 84 01/22/2020 1012   HDL 38 (L) 01/22/2020 1012   LDLCALC 96 01/22/2020 1012    Other Studies Reviewed Today:  No prior cardiac testing for review.  Assessment and Plan:  Patient presents with evidence of fluid overload/anasarca with increased abdominal girth and bilateral leg edema up to the thighs.  He is tachycardic, and although sinus tachycardia possible, cannot exclude an atypical atrial flutter or atrial tachycardia with 2:1 block.  He reports onset of shortness of breath and body edema in November 2021 after having his second COVID-19 vaccine earlier that month.  Also has what sounds like a fairly significant alcohol use history at baseline.  Concern is for possible development of atrial arrhythmia and/or myocarditis (which would be unusual) after second COVID-19 vaccine with associated cardiomyopathy and now acute systolic heart failure with right side predominant symptoms.  On the other hand an alcohol related cardiomyopathy is also possible.  He has not responded to outpatient escalation in oral Lasix, weight is up and he remains symptomatic.  I discussed situation with the patient and his wife, also reviewed with hospitalist service.  Plan is admission to the telemetry unit.  COVID-19 test will be sent.  Would check follow-up lab work including CBC, BMET, and a BNP with plan to initiate IV Lasix and also Toprol-XL as a first step.  Needs to be on cardiac monitor to get a better sense of heart rate and rhythm.  If he ultimately looks to be more definitively in atrial flutter, initiation of anticoagulant with plan for cardioversion next week (particular if he has an associated cardiomyopathy) should be considered.  Echocardiogram needs to be obtained to assess cardiac structure and function.  If rhythm settles out to be sinus tachycardia with more  appropriate heart rate variation, then would continue with fluid management and optimization of goal-directed medical therapy based on LVEF.  I did talk with him about reduction in alcohol intake as well.  He states that he has already cut back with his recent symptomatology.  Medication Adjustments/Labs and Tests Ordered: Current medicines are reviewed at length with the patient today.  Concerns regarding medicines are outlined above.   Tests Ordered: Orders Placed This Encounter  Procedures   EKG 12-Lead    Medication Changes: No orders of the defined types were placed in this encounter.   Disposition: Patient is being directly admitted to the hospital.  Signed, Jonelle Sidle, MD, [facility] 02/06/2020 3:36 PM    [facility] [address] [place], [place] [address] Phone: [phone]; Fax: [phone]

## 2020-02-06 NOTE — Progress Notes (Signed)
Pt arrived via WC to room 320. Oriented to room and safety precautions, pt and wife state understanding.

## 2020-02-06 NOTE — Progress Notes (Signed)
°   02/06/20 2004  Assess: MEWS Score  Pulse Rate (!) 116  Resp 18  Level of Consciousness Alert  Assess: if the MEWS score is Yellow or Red  Were vital signs taken at a resting state? No  Focused Assessment No change from prior assessment  Early Detection of Sepsis Score *See Row Information* Low  MEWS guidelines implemented *See Row Information* No, other (Comment)

## 2020-02-07 ENCOUNTER — Inpatient Hospital Stay (HOSPITAL_COMMUNITY): Payer: Managed Care, Other (non HMO)

## 2020-02-07 DIAGNOSIS — R0600 Dyspnea, unspecified: Secondary | ICD-10-CM

## 2020-02-07 DIAGNOSIS — F1010 Alcohol abuse, uncomplicated: Secondary | ICD-10-CM

## 2020-02-07 DIAGNOSIS — I340 Nonrheumatic mitral (valve) insufficiency: Secondary | ICD-10-CM | POA: Diagnosis not present

## 2020-02-07 DIAGNOSIS — I5021 Acute systolic (congestive) heart failure: Secondary | ICD-10-CM | POA: Diagnosis not present

## 2020-02-07 DIAGNOSIS — I361 Nonrheumatic tricuspid (valve) insufficiency: Secondary | ICD-10-CM

## 2020-02-07 DIAGNOSIS — E8770 Fluid overload, unspecified: Secondary | ICD-10-CM | POA: Diagnosis not present

## 2020-02-07 LAB — CBC WITH DIFFERENTIAL/PLATELET
Abs Immature Granulocytes: 0.06 10*3/uL (ref 0.00–0.07)
Basophils Absolute: 0 10*3/uL (ref 0.0–0.1)
Basophils Relative: 0 %
Eosinophils Absolute: 0 10*3/uL (ref 0.0–0.5)
Eosinophils Relative: 0 %
HCT: 44.3 % (ref 39.0–52.0)
Hemoglobin: 14.2 g/dL (ref 13.0–17.0)
Immature Granulocytes: 1 %
Lymphocytes Relative: 9 %
Lymphs Abs: 0.8 10*3/uL (ref 0.7–4.0)
MCH: 31.2 pg (ref 26.0–34.0)
MCHC: 32.1 g/dL (ref 30.0–36.0)
MCV: 97.4 fL (ref 80.0–100.0)
Monocytes Absolute: 1 10*3/uL (ref 0.1–1.0)
Monocytes Relative: 11 %
Neutro Abs: 7.7 10*3/uL (ref 1.7–7.7)
Neutrophils Relative %: 79 %
Platelets: 288 10*3/uL (ref 150–400)
RBC: 4.55 MIL/uL (ref 4.22–5.81)
RDW: 16.6 % — ABNORMAL HIGH (ref 11.5–15.5)
WBC: 9.7 10*3/uL (ref 4.0–10.5)
nRBC: 0 % (ref 0.0–0.2)

## 2020-02-07 LAB — COMPREHENSIVE METABOLIC PANEL
ALT: 43 U/L (ref 0–44)
AST: 46 U/L — ABNORMAL HIGH (ref 15–41)
Albumin: 3.1 g/dL — ABNORMAL LOW (ref 3.5–5.0)
Alkaline Phosphatase: 119 U/L (ref 38–126)
Anion gap: 9 (ref 5–15)
BUN: 24 mg/dL — ABNORMAL HIGH (ref 6–20)
CO2: 29 mmol/L (ref 22–32)
Calcium: 8.9 mg/dL (ref 8.9–10.3)
Chloride: 99 mmol/L (ref 98–111)
Creatinine, Ser: 1 mg/dL (ref 0.61–1.24)
GFR, Estimated: >60 mL/min (ref >60)
Glucose, Bld: 115 mg/dL — ABNORMAL HIGH (ref 70–99)
Potassium: 3.9 mmol/L (ref 3.5–5.1)
Sodium: 137 mmol/L (ref 135–145)
Total Bilirubin: 1.4 mg/dL — ABNORMAL HIGH (ref 0.3–1.2)
Total Protein: 6.5 g/dL (ref 6.5–8.1)

## 2020-02-07 LAB — MAGNESIUM: Magnesium: 2.2 mg/dL (ref 1.7–2.4)

## 2020-02-07 LAB — ECHOCARDIOGRAM COMPLETE
Area-P 1/2: 8.92 cm2
Calc EF: 21.7 %
Height: 68 in
MV M vel: 3.99 m/s
MV Peak grad: 63.7 mmHg
Radius: 0.6 cm
S' Lateral: 5.9 cm
Single Plane A2C EF: 13.9 %
Single Plane A4C EF: 26.4 %
Weight: 3858.93 oz

## 2020-02-07 MED ORDER — POTASSIUM CHLORIDE CRYS ER 20 MEQ PO TBCR
40.0000 meq | EXTENDED_RELEASE_TABLET | Freq: Once | ORAL | Status: AC
  Administered 2020-02-07: 40 meq via ORAL
  Filled 2020-02-07: qty 2

## 2020-02-07 MED ORDER — SACUBITRIL-VALSARTAN 24-26 MG PO TABS
1.0000 | ORAL_TABLET | Freq: Two times a day (BID) | ORAL | Status: DC
  Administered 2020-02-07 – 2020-02-20 (×26): 1 via ORAL
  Filled 2020-02-07 (×26): qty 1

## 2020-02-07 NOTE — Progress Notes (Signed)
*  PRELIMINARY RESULTS* Echocardiogram 2D Echocardiogram has been performed.  Stacey Drain 02/07/2020, 9:59 AM

## 2020-02-07 NOTE — Progress Notes (Signed)
Echocardiogram completed, pt waiting to have abd [place] completed. Resting in bed, denies c/o. No SOB, resp even and nonlabored.  Central tele called and advised that pt's QTC interval is 529 and also advised that pt had 7 beat run of VTach early this am. Currently sinus rhythm. MD Memon notified of central tele report and current status.

## 2020-02-07 NOTE — Progress Notes (Signed)
   02/07/20 2110  Assess: MEWS Score  Temp 98.4 F (36.9 C)  BP (!) 124/91  Pulse Rate (!) 111  Resp 18  SpO2 98 %  O2 Device Nasal Cannula  Assess: MEWS Score  MEWS Temp 0  MEWS Systolic 0  MEWS Pulse 2  MEWS RR 0  MEWS LOC 0  MEWS Score 2  MEWS Score Color Yellow  Assess: if the MEWS score is Yellow or Red  Were vital signs taken at a resting state? Yes  Focused Assessment Change from prior assessment (see assessment flowsheet)  Early Detection of Sepsis Score *See Row Information* Low  MEWS guidelines implemented *See Row Information* No, other (Comment) (Going to administer scheduled medications)  Treat  MEWS Interventions Administered scheduled meds/treatments  Pain Scale 0-10  Pain Score 0  Notify: Charge Nurse/RN  Name of Charge Nurse/RN Notified Gean Quint, RN  Date Charge Nurse/RN Notified 02/07/20  Time Charge Nurse/RN Notified 2115  Document  Progress note created (see row info) Yes

## 2020-02-07 NOTE — Progress Notes (Signed)
PROGRESS NOTE    Craig Hanson  ZWC:[id number] DOB: 04-27-1970 DOA: 02/06/2020 PCP: Heather Roberts, NP    Brief Narrative:  50 year old male with history of alcohol abuse, admitted to the hospital with worsening volume overload/anasarca.  He was admitted to the hospital for IV diuresis.  Echocardiogram shows cardiomyopathy with ejection fraction of 20 to 25%.  Cardiology following.   Assessment & Plan:   Active Problems:   Volume overload   Acute CHF (congestive heart failure) (HCC)   Acute systolic congestive heart failure -Patient admitted to the hospital with significant volume overload -He is started on intravenous Lasix with good urine output -Echocardiogram showed ejection fraction of 20 to 25% -Currently on low-dose metoprolol -We will add Entresto, confirmed that he has not received ACE inhibitors in the past 36 hours, no allergies to ACE inhibitors/ARB reported -Continue to monitor intake and output -Defer any need for ischemic work-up to cardiology  History of alcohol abuse -Currently on CIWA protocol -No signs of withdrawal at this time -Patient says that he is motivated to stop drinking   DVT prophylaxis: heparin injection 5,000 Units Start: 02/07/20 0600 SCDs Start: 02/06/20 1631  Code Status: Full code Family Communication: Discussed with his wife at the bedside Disposition Plan: Status is: Inpatient  Remains inpatient appropriate because:IV treatments appropriate due to intensity of illness or inability to take PO   Dispo: The patient is from: Home              Anticipated d/c is to: Home              Anticipated d/c date is: 3 days              Patient currently is not medically stable to d/c.   Difficult to place patient No         Consultants:   Cardiology  Procedures:   Echocardiogram with ejection fraction of 20 to 25% global hypokinesis  Antimicrobials:       Subjective: Denies any chest pain, shortness of breath, he  denies any palpitations.  Continues to have significant swelling lower extremities  Objective: Vitals:   02/07/20 0005 02/07/20 0005 02/07/20 0401 02/07/20 1355  BP: 114/87 114/87 (!) 133/91 136/90  Pulse: (!) 102 (!) 104 (!) 103 (!) 108  Resp: 20 20 20 20   Temp: 97.9 F (36.6 C) 97.9 F (36.6 C) 97.8 F (36.6 C) 98.2 F (36.8 C)  TempSrc: Oral Oral Oral Oral  SpO2: 97% 97% 99% 99%  Weight:   109.4 kg   Height:        Intake/Output Summary (Last 24 hours) at 02/07/2020 1813 Last data filed at 02/07/2020 1400 Gross per 24 hour  Intake 243 ml  Output 1700 ml  Net -1457 ml   Filed Weights   02/06/20 1613 02/07/20 0401  Weight: 111.1 kg 109.4 kg    Examination:  General exam: Appears calm and comfortable  Respiratory system: Clear to auscultation. Respiratory effort normal. Cardiovascular system: S1 & S2 heard, RRR. No JVD, murmurs, rubs, gallops or clicks.  Gastrointestinal system: Abdomen is nondistended, soft and nontender. No organomegaly or masses felt. Normal bowel sounds heard. Central nervous system: Alert and oriented. No focal neurological deficits. Extremities: 2-3+ pitting edema in LE, abdominal wall edema Skin: No rashes, lesions or ulcers Psychiatry: Judgement and insight appear normal. Mood & affect appropriate.     Data Reviewed: I have personally reviewed following labs and imaging studies  CBC: Recent Labs  Lab  02/06/20 1851 02/07/20 0907  WBC 9.6 9.7  NEUTROABS 7.9* 7.7  HGB 14.0 14.2  HCT 44.0 44.3  MCV 96.3 97.4  PLT 299 288   Basic Metabolic Panel: Recent Labs  Lab 02/06/20 1851 02/07/20 0907 02/07/20 1104  NA 133* 137  --   K 3.5 3.9  --   CL 96* 99  --   CO2 26 29  --   GLUCOSE 167* 115*  --   BUN 23* 24*  --   CREATININE 1.11 1.00  --   CALCIUM 8.4* 8.9  --   MG 2.2  --  2.2  PHOS 3.4  --   --    GFR: Estimated Creatinine Clearance: 104.8 mL/min (by C-G formula based on SCr of 1 mg/dL). Liver Function Tests: Recent Labs   Lab 02/06/20 1851 02/07/20 0907  AST 50* 46*  ALT 45* 43  ALKPHOS 119 119  BILITOT 1.4* 1.4*  PROT 6.8 6.5  ALBUMIN 3.2* 3.1*   No results for input(s): LIPASE, AMYLASE in the last 168 hours. No results for input(s): AMMONIA in the last 168 hours. Coagulation Profile: Recent Labs  Lab 02/06/20 1851  INR 1.5*   Cardiac Enzymes: No results for input(s): CKTOTAL, CKMB, CKMBINDEX, TROPONINI in the last 168 hours. BNP (last 3 results) No results for input(s): PROBNP in the last 8760 hours. HbA1C: No results for input(s): HGBA1C in the last 72 hours. CBG: No results for input(s): GLUCAP in the last 168 hours. Lipid Profile: No results for input(s): CHOL, HDL, LDLCALC, TRIG, CHOLHDL, LDLDIRECT in the last 72 hours. Thyroid Function Tests: No results for input(s): TSH, T4TOTAL, FREET4, T3FREE, THYROIDAB in the last 72 hours. Anemia Panel: No results for input(s): VITAMINB12, FOLATE, FERRITIN, TIBC, IRON, RETICCTPCT in the last 72 hours. Sepsis Labs: No results for input(s): PROCALCITON, LATICACIDVEN in the last 168 hours.  Recent Results (from the past 240 hour(s))  SARS Coronavirus 2 by RT PCR (hospital order, performed in [facility] hospital lab)     Status: None   Collection Time: 02/06/20  3:30 PM  Result Value Ref Range Status   SARS Coronavirus 2 NEGATIVE NEGATIVE Final    Comment: (NOTE) SARS-CoV-2 target nucleic acids are NOT DETECTED.  The SARS-CoV-2 RNA is generally detectable in upper and lower respiratory specimens during the acute phase of infection. The lowest concentration of SARS-CoV-2 viral copies this assay can detect is 250 copies / mL. A negative result does not preclude SARS-CoV-2 infection and should not be used as the sole basis for treatment or other patient management decisions.  A negative result may occur with improper specimen collection / handling, submission of specimen other than nasopharyngeal swab, presence of viral mutation(s) within  the areas targeted by this assay, and inadequate number of viral copies (<250 copies / mL). A negative result must be combined with clinical observations, patient history, and epidemiological information.  Fact Sheet for Patients:   BoilerBrush.com.cyhttps://www.fda.gov/media/136312/download  Fact Sheet for Healthcare Providers: https://pope.com/https://www.fda.gov/media/136313/download  This test is not yet approved or  cleared by the [place] FDA and has been authorized for detection and/or diagnosis of SARS-CoV-2 by FDA under an Emergency Use Authorization (EUA).  This EUA will remain in effect (meaning this test can be used) for the duration of the COVID-19 declaration under Section 564(b)(1) of the Act, 21 U.S.C. section 360bbb-3(b)(1), unless the authorization is terminated or revoked sooner.  Performed at [facility], [address] [place], [place] [address]  Radiology Studies: US Abdomen Complete  Result Date: 02/07/2020 CLINICAL DATA:  Ascites, swelling of abdomen and lower extremities EXAM: ABDOMEN ULTRASOUND COMPLETE COMPARISON:  None. FINDINGS: Gallbladder: No gallstones. Mild layering gallbladder sludge. Gallbladder wall edema, measuring up to 8 mm. Negative sonographic Murphy's sign. Common bile duct: Diameter: 4 mm Liver: Hyperechoic hepatic parenchyma. Mildly nodular hepatic contour. No focal hepatic lesion is seen. Portal vein is patent on color Doppler imaging with normal direction of blood flow towards the liver. IVC: No abnormality visualized. Pancreas: Poorly visualized due to overlying bowel gas. Spleen: Size and appearance within normal limits. Right Kidney: Length: 11.9 cm. Echogenicity within normal limits. No mass or hydronephrosis visualized. Left Kidney: Length: 11.1 cm. Echogenicity within normal limits. No mass or hydronephrosis visualized. Abdominal aorta: No aneurysm visualized. Other findings: Small volume perihepatic and perisplenic ascites. IMPRESSION: Gallbladder wall  edema, without associated sonographic findings to suggest acute cholecystitis. This appearance is likely secondary to right upper quadrant inflammation or hypoalbuminemia. Mild upper abdominal ascites. Hyperechoic hepatic parenchyma with a mildly nodular hepatic contour, raising the possibility of mild cirrhosis and/or hepatic steatosis. Electronically Signed   By: Charline Bills M.D.   On: 02/07/2020 12:02   DG Chest Port 1 View  Result Date: 02/07/2020 CLINICAL DATA:  Asthma, dyspnea EXAM: PORTABLE CHEST 1 VIEW COMPARISON:  None. FINDINGS: Lungs are clear. No pneumothorax or pleural effusion. Moderate cardiomegaly is present. Pulmonary vascularity is normal. No acute bone abnormality. IMPRESSION: Moderate cardiomegaly. Electronically Signed   By: Helyn Numbers MD   On: 02/07/2020 05:28   ECHOCARDIOGRAM COMPLETE  Result Date: 02/07/2020    ECHOCARDIOGRAM REPORT   Patient Name:   BLAYDEN CONWELL Date of Exam: 02/07/2020 Medical Rec #:  [id number]          Height:       68.0 in Accession #:    [id number]         Weight:       241.2 lb Date of Birth:  1970/05/24           BSA:          2.213 m Patient Age:    49 years           BP:           133/91 mmHg Patient Gender: M                  HR:           103 bpm. [facility] Procedure: 2D Echo, Cardiac Doppler and Color Doppler Indications:    Dyspnea R06.00  History:        Patient has no prior history of Echocardiogram examinations.                 CHF. 3+ pitting edema.  Sonographer:    Celesta Gentile RCS Referring Phys: [id number] DAWOOD S [place] IMPRESSIONS  1. Left ventricular ejection fraction, by estimation, is 20 to 25%. The left ventricle has severely decreased function. The left ventricle demonstrates global hypokinesis. The left ventricular internal cavity size was moderately to severely dilated. Left ventricular diastolic parameters are consistent with Grade III diastolic dysfunction (restrictive).  2. Right ventricular systolic  function is moderately reduced. The right ventricular size is moderately enlarged. There is moderately elevated pulmonary artery systolic pressure.  3. Left atrial size was moderately dilated.  4. Right atrial size was mildly dilated.  5. Large pericardial effusion.  6. The mitral valve is normal  in structure. Moderate mitral valve regurgitation.  7. Tricuspid valve regurgitation is moderate.  8. The aortic valve is tricuspid. Aortic valve regurgitation is not visualized.  9. The inferior vena cava is dilated in size with <50% respiratory variability, suggesting right atrial pressure of 15 mmHg. There is no evidence of mitral or tricuspid valve peak veloxity inflow variation, studies assumes respiratory maneuvers are performed. 10. New Biventricular dsyfunction with large, circumferential pericardial effusion. Reaching out to primary team. Comparison(s): No prior Echocardiogram. FINDINGS  Left Ventricle: DP/Dt diminised at 821 mm Hg/s. Left ventricular ejection fraction, by estimation, is 20 to 25%. The left ventricle has severely decreased function. The left ventricle demonstrates global hypokinesis. The left ventricular internal cavity  size was moderately to severely dilated. There is no left ventricular hypertrophy. Left ventricular diastolic parameters are consistent with Grade III diastolic dysfunction (restrictive). Right Ventricle: The right ventricular size is moderately enlarged. No increase in right ventricular wall thickness. Right ventricular systolic function is moderately reduced. There is moderately elevated pulmonary artery systolic pressure. The tricuspid  regurgitant velocity is 2.99 m/s, and with an assumed right atrial pressure of 15 mmHg, the estimated right ventricular systolic pressure is 50.8 mmHg. Left Atrium: Left atrial size was moderately dilated. Right Atrium: Right atrial size was mildly dilated. Pericardium: A large pericardial effusion is present. Mitral Valve: The mitral valve is  normal in structure. Moderate mitral valve regurgitation. Tricuspid Valve: The tricuspid valve is normal in structure. Tricuspid valve regurgitation is moderate. Aortic Valve: The aortic valve is tricuspid. Aortic valve regurgitation is not visualized. Pulmonic Valve: The pulmonic valve was not well visualized. Pulmonic valve regurgitation is trivial. Aorta: The aortic root is normal in size and structure and the ascending aorta was not well visualized. Venous: The pulmonary veins were not well visualized. The inferior vena cava is dilated in size with less than 50% respiratory variability, suggesting right atrial pressure of 15 mmHg. IAS/Shunts: No atrial level shunt detected by color flow Doppler.  LEFT VENTRICLE PLAX 2D LVIDd:         6.60 cm LVIDs:         5.90 cm LV PW:         1.10 cm LV IVS:        0.80 cm LVOT diam:     2.00 cm LV SV:         29 LV SV Index:   13 LVOT Area:     3.14 cm  LV Volumes (MOD) LV vol d, MOD A2C: 194.0 ml LV vol d, MOD A4C: 182.0 ml LV vol s, MOD A2C: 167.0 ml LV vol s, MOD A4C: 134.0 ml LV SV MOD A2C:     27.0 ml LV SV MOD A4C:     182.0 ml LV SV MOD BP:      42.2 ml RIGHT VENTRICLE RV S prime:     8.03 cm/s TAPSE (M-mode): 2.0 cm LEFT ATRIUM              Index       RIGHT ATRIUM           Index LA diam:        5.10 cm  2.30 cm/m  RA Area:     25.40 cm LA Vol (A2C):   94.1 ml  42.52 ml/m RA Volume:   82.10 ml  37.10 ml/m LA Vol (A4C):   96.2 ml  43.47 ml/m LA Biplane Vol: 104.0 ml 46.99 ml/m  AORTIC VALVE LVOT Vmax:  65.90 cm/s LVOT Vmean:  37.100 cm/s LVOT VTI:    0.094 m  AORTA Ao Root diam: 3.60 cm MITRAL VALVE                 TRICUSPID VALVE MV Area (PHT): 8.92 cm      TR Peak grad:   35.8 mmHg MV Decel Time: 85 msec       TR Vmax:        299.00 cm/s MR Peak grad:    63.7 mmHg MR Mean grad:    39.0 mmHg   SHUNTS MR Vmax:         399.00 cm/s Systemic VTI:  0.09 m MR Vmean:        292.0 cm/s  Systemic Diam: 2.00 cm MR PISA:         2.26 cm MR PISA Eff ROA: 14 mm MR  PISA Radius:  0.60 cm MV E velocity: 1.05 cm/s MV A velocity: 24.40 cm/s MV E/A ratio:  0.04 Riley Lam MD Electronically signed by Riley Lam MD Signature Date/Time: 02/07/2020/12:44:43 PM    Final         Scheduled Meds: . folic acid  1 mg Oral Daily  . furosemide  60 mg Intravenous BID  . heparin  5,000 Units Subcutaneous Q8H  . metoprolol tartrate  25 mg Oral BID  . multivitamin with minerals  1 tablet Oral Daily  . sacubitril-valsartan  1 tablet Oral BID  . sodium chloride flush  3 mL Intravenous Q12H  . thiamine  100 mg Oral Daily   Or  . thiamine  100 mg Intravenous Daily   Continuous Infusions: . sodium chloride       LOS: 1 day    Time spent:    Erick Blinks, MD [facility]   If 7PM-7AM, please contact night-coverage [email or web address]  02/07/2020, 6:13 PM

## 2020-02-08 DIAGNOSIS — E8770 Fluid overload, unspecified: Secondary | ICD-10-CM | POA: Diagnosis not present

## 2020-02-08 DIAGNOSIS — F1010 Alcohol abuse, uncomplicated: Secondary | ICD-10-CM | POA: Diagnosis not present

## 2020-02-08 DIAGNOSIS — I5021 Acute systolic (congestive) heart failure: Secondary | ICD-10-CM | POA: Diagnosis not present

## 2020-02-08 LAB — CBC WITH DIFFERENTIAL/PLATELET
Abs Immature Granulocytes: 0.07 10*3/uL (ref 0.00–0.07)
Basophils Absolute: 0 10*3/uL (ref 0.0–0.1)
Basophils Relative: 0 %
Eosinophils Absolute: 0 10*3/uL (ref 0.0–0.5)
Eosinophils Relative: 0 %
HCT: 43.9 % (ref 39.0–52.0)
Hemoglobin: 13.9 g/dL (ref 13.0–17.0)
Immature Granulocytes: 1 %
Lymphocytes Relative: 8 %
Lymphs Abs: 0.8 10*3/uL (ref 0.7–4.0)
MCH: 30.5 pg (ref 26.0–34.0)
MCHC: 31.7 g/dL (ref 30.0–36.0)
MCV: 96.5 fL (ref 80.0–100.0)
Monocytes Absolute: 1.1 10*3/uL — ABNORMAL HIGH (ref 0.1–1.0)
Monocytes Relative: 11 %
Neutro Abs: 7.9 10*3/uL — ABNORMAL HIGH (ref 1.7–7.7)
Neutrophils Relative %: 80 %
Platelets: 278 10*3/uL (ref 150–400)
RBC: 4.55 MIL/uL (ref 4.22–5.81)
RDW: 16.7 % — ABNORMAL HIGH (ref 11.5–15.5)
WBC: 10 10*3/uL (ref 4.0–10.5)
nRBC: 0.2 % (ref 0.0–0.2)

## 2020-02-08 LAB — COMPREHENSIVE METABOLIC PANEL
ALT: 43 U/L (ref 0–44)
AST: 48 U/L — ABNORMAL HIGH (ref 15–41)
Albumin: 2.9 g/dL — ABNORMAL LOW (ref 3.5–5.0)
Alkaline Phosphatase: 120 U/L (ref 38–126)
Anion gap: 10 (ref 5–15)
BUN: 26 mg/dL — ABNORMAL HIGH (ref 6–20)
CO2: 29 mmol/L (ref 22–32)
Calcium: 8.8 mg/dL — ABNORMAL LOW (ref 8.9–10.3)
Chloride: 99 mmol/L (ref 98–111)
Creatinine, Ser: 1.03 mg/dL (ref 0.61–1.24)
GFR, Estimated: >60 mL/min (ref >60)
Glucose, Bld: 118 mg/dL — ABNORMAL HIGH (ref 70–99)
Potassium: 4.4 mmol/L (ref 3.5–5.1)
Sodium: 138 mmol/L (ref 135–145)
Total Bilirubin: 1.3 mg/dL — ABNORMAL HIGH (ref 0.3–1.2)
Total Protein: 6.2 g/dL — ABNORMAL LOW (ref 6.5–8.1)

## 2020-02-08 MED ORDER — METOPROLOL SUCCINATE ER 25 MG PO TB24
25.0000 mg | ORAL_TABLET | Freq: Two times a day (BID) | ORAL | Status: DC
  Administered 2020-02-08 – 2020-02-09 (×2): 25 mg via ORAL
  Filled 2020-02-08 (×2): qty 1

## 2020-02-08 NOTE — Progress Notes (Signed)
PROGRESS NOTE    Craig Hanson  GDJ:[id number] DOB: Jul 01, 1970 DOA: 02/06/2020 PCP: Heather Roberts, NP    Brief Narrative:  50 year old male with history of alcohol abuse, admitted to the hospital with worsening volume overload/anasarca.  He was admitted to the hospital for IV diuresis.  Echocardiogram shows cardiomyopathy with ejection fraction of 20 to 25%.  Cardiology following.   Assessment & Plan:   Active Problems:   Volume overload   Acute CHF (congestive heart failure) (HCC)   Acute systolic congestive heart failure -Patient admitted to the hospital with significant volume overload -He is currently on intravenous Lasix with good urine output -Echocardiogram showed ejection fraction of 20 to 25% -Currently on low-dose Toprol -We will add Entresto, confirmed that he has not received ACE inhibitors in the past 36 hours, no allergies to ACE inhibitors/ARB reported -Continue to monitor intake and output -Defer any need for ischemic work-up to cardiology  Pericardial effusion -Noted on echocardiogram without any evidence of cardiac tamponade -Discussed with cardiology, Dr. Ronette Deter who recommended repeat echo on 1/24  History of alcohol abuse -Currently on CIWA protocol -No signs of withdrawal at this time -Patient says that he is motivated to stop drinking   DVT prophylaxis: heparin injection 5,000 Units Start: 02/07/20 0600 SCDs Start: 02/06/20 1631  Code Status: Full code Family Communication: Discussed with patient Disposition Plan: Status is: Inpatient  Remains inpatient appropriate because:IV treatments appropriate due to intensity of illness or inability to take PO   Dispo: The patient is from: Home              Anticipated d/c is to: Home              Anticipated d/c date is: 3 days              Patient currently is not medically stable to d/c.   Difficult to place patient No         Consultants:   Cardiology  Procedures:    Echocardiogram with ejection fraction of 20 to 25% global hypokinesis  Antimicrobials:       Subjective: He is feeling better.  Feels shortness of breath is improving.  Continues to have lower extremity edema, but feels this is also improving  Objective: Vitals:   02/07/20 2355 02/08/20 0500 02/08/20 0522 02/08/20 1405  BP:   (!) 122/92 (!) 123/93  Pulse: 95  (!) 101 (!) 105  Resp:   20   Temp:   (!) 97.5 F (36.4 C) 97.9 F (36.6 C)  TempSrc:   Oral Oral  SpO2:   100% 100%  Weight:  103.3 kg    Height:        Intake/Output Summary (Last 24 hours) at 02/08/2020 1826 Last data filed at 02/08/2020 1750 Gross per 24 hour  Intake 960 ml  Output 1875 ml  Net -915 ml   Filed Weights   02/06/20 1613 02/07/20 0401 02/08/20 0500  Weight: 111.1 kg 109.4 kg 103.3 kg    Examination:  General exam: Alert, awake, oriented x 3 Respiratory system: Clear to auscultation. Respiratory effort normal. Cardiovascular system:RRR. No murmurs, rubs, gallops. Gastrointestinal system: Abdomen is nondistended, soft and nontender. No organomegaly or masses felt. Normal bowel sounds heard. Central nervous system: Alert and oriented. No focal neurological deficits. Extremities: 2-3+ pitting edema, abdominal wall edema Skin: No rashes, lesions or ulcers Psychiatry: Judgement and insight appear normal. Mood & affect appropriate.      Data Reviewed: I have personally  reviewed following labs and imaging studies  CBC: Recent Labs  Lab 02/06/20 1851 02/07/20 0907 02/08/20 0500  WBC 9.6 9.7 10.0  NEUTROABS 7.9* 7.7 7.9*  HGB 14.0 14.2 13.9  HCT 44.0 44.3 43.9  MCV 96.3 97.4 96.5  PLT 299 288 278   Basic Metabolic Panel: Recent Labs  Lab 02/06/20 1851 02/07/20 0907 02/07/20 1104 02/08/20 0500  NA 133* 137  --  138  K 3.5 3.9  --  4.4  CL 96* 99  --  99  CO2 26 29  --  29  GLUCOSE 167* 115*  --  118*  BUN 23* 24*  --  26*  CREATININE 1.11 1.00  --  1.03  CALCIUM 8.4* 8.9  --   8.8*  MG 2.2  --  2.2  --   PHOS 3.4  --   --   --    GFR: Estimated Creatinine Clearance: 98.9 mL/min (by C-G formula based on SCr of 1.03 mg/dL). Liver Function Tests: Recent Labs  Lab 02/06/20 1851 02/07/20 0907 02/08/20 0500  AST 50* 46* 48*  ALT 45* 43 43  ALKPHOS 119 119 120  BILITOT 1.4* 1.4* 1.3*  PROT 6.8 6.5 6.2*  ALBUMIN 3.2* 3.1* 2.9*   No results for input(s): LIPASE, AMYLASE in the last 168 hours. No results for input(s): AMMONIA in the last 168 hours. Coagulation Profile: Recent Labs  Lab 02/06/20 1851  INR 1.5*   Cardiac Enzymes: No results for input(s): CKTOTAL, CKMB, CKMBINDEX, TROPONINI in the last 168 hours. BNP (last 3 results) No results for input(s): PROBNP in the last 8760 hours. HbA1C: No results for input(s): HGBA1C in the last 72 hours. CBG: No results for input(s): GLUCAP in the last 168 hours. Lipid Profile: No results for input(s): CHOL, HDL, LDLCALC, TRIG, CHOLHDL, LDLDIRECT in the last 72 hours. Thyroid Function Tests: No results for input(s): TSH, T4TOTAL, FREET4, T3FREE, THYROIDAB in the last 72 hours. Anemia Panel: No results for input(s): VITAMINB12, FOLATE, FERRITIN, TIBC, IRON, RETICCTPCT in the last 72 hours. Sepsis Labs: No results for input(s): PROCALCITON, LATICACIDVEN in the last 168 hours.  Recent Results (from the past 240 hour(s))  SARS Coronavirus 2 by RT PCR (hospital order, performed in [facility] hospital lab)     Status: None   Collection Time: 02/06/20  3:30 PM  Result Value Ref Range Status   SARS Coronavirus 2 NEGATIVE NEGATIVE Final    Comment: (NOTE) SARS-CoV-2 target nucleic acids are NOT DETECTED.  The SARS-CoV-2 RNA is generally detectable in upper and lower respiratory specimens during the acute phase of infection. The lowest concentration of SARS-CoV-2 viral copies this assay can detect is 250 copies / mL. A negative result does not preclude SARS-CoV-2 infection and should not be used as the sole  basis for treatment or other patient management decisions.  A negative result may occur with improper specimen collection / handling, submission of specimen other than nasopharyngeal swab, presence of viral mutation(s) within the areas targeted by this assay, and inadequate number of viral copies (<250 copies / mL). A negative result must be combined with clinical observations, patient history, and epidemiological information.  Fact Sheet for Patients:   BoilerBrush.com.cy  Fact Sheet for Healthcare Providers: https://pope.com/  This test is not yet approved or  cleared by the [place] FDA and has been authorized for detection and/or diagnosis of SARS-CoV-2 by FDA under an Emergency Use Authorization (EUA).  This EUA will remain in effect (meaning this test can be  used) for the duration of the COVID-19 declaration under Section 564(b)(1) of the Act, 21 U.S.C. section 360bbb-3(b)(1), unless the authorization is terminated or revoked sooner.  Performed at [facility] [address], [place], [place] [address]          Radiology Studies: US Abdomen Complete  Result Date: 02/07/2020 CLINICAL DATA:  Ascites, swelling of abdomen and lower extremities EXAM: ABDOMEN ULTRASOUND COMPLETE COMPARISON:  None. FINDINGS: Gallbladder: No gallstones. Mild layering gallbladder sludge. Gallbladder wall edema, measuring up to 8 mm. Negative sonographic Murphy's sign. Common bile duct: Diameter: 4 mm Liver: Hyperechoic hepatic parenchyma. Mildly nodular hepatic contour. No focal hepatic lesion is seen. Portal vein is patent on color Doppler imaging with normal direction of blood flow towards the liver. IVC: No abnormality visualized. Pancreas: Poorly visualized due to overlying bowel gas. Spleen: Size and appearance within normal limits. Right Kidney: Length: 11.9 cm. Echogenicity within normal limits. No mass or hydronephrosis visualized. Left Kidney:  Length: 11.1 cm. Echogenicity within normal limits. No mass or hydronephrosis visualized. Abdominal aorta: No aneurysm visualized. Other findings: Small volume perihepatic and perisplenic ascites. IMPRESSION: Gallbladder wall edema, without associated sonographic findings to suggest acute cholecystitis. This appearance is likely secondary to right upper quadrant inflammation or hypoalbuminemia. Mild upper abdominal ascites. Hyperechoic hepatic parenchyma with a mildly nodular hepatic contour, raising the possibility of mild cirrhosis and/or hepatic steatosis. Electronically Signed   By: Charline Bills M.D.   On: 02/07/2020 12:02   DG Chest Port 1 View  Result Date: 02/07/2020 CLINICAL DATA:  Asthma, dyspnea EXAM: PORTABLE CHEST 1 VIEW COMPARISON:  None. FINDINGS: Lungs are clear. No pneumothorax or pleural effusion. Moderate cardiomegaly is present. Pulmonary vascularity is normal. No acute bone abnormality. IMPRESSION: Moderate cardiomegaly. Electronically Signed   By: Helyn Numbers MD   On: 02/07/2020 05:28   ECHOCARDIOGRAM COMPLETE  Result Date: 02/07/2020    ECHOCARDIOGRAM REPORT   Patient Name:   Craig Hanson Date of Exam: 02/07/2020 Medical Rec #:  [id number]          Height:       68.0 in Accession #:    [id number]         Weight:       241.2 lb Date of Birth:  1970/07/17           BSA:          2.213 m Patient Age:    49 years           BP:           133/91 mmHg Patient Gender: M                  HR:           103 bpm. [facility] Procedure: 2D Echo, Cardiac Doppler and Color Doppler Indications:    Dyspnea R06.00  History:        Patient has no prior history of Echocardiogram examinations.                 CHF. 3+ pitting edema.  Sonographer:    Celesta Gentile RCS Referring Phys: [phone] DAWOOD S ELGER[place] IMPRESSIONS  1. Left ventricular ejection fraction, by estimation, is 20 to 25%. The left ventricle has severely decreased function. The left ventricle demonstrates global  hypokinesis. The left ventricular internal cavity size was moderately to severely dilated. Left ventricular diastolic parameters are consistent with Grade III diastolic dysfunction (restrictive).  2. Right ventricular systolic  function is moderately reduced. The right ventricular size is moderately enlarged. There is moderately elevated pulmonary artery systolic pressure.  3. Left atrial size was moderately dilated.  4. Right atrial size was mildly dilated.  5. Large pericardial effusion.  6. The mitral valve is normal in structure. Moderate mitral valve regurgitation.  7. Tricuspid valve regurgitation is moderate.  8. The aortic valve is tricuspid. Aortic valve regurgitation is not visualized.  9. The inferior vena cava is dilated in size with <50% respiratory variability, suggesting right atrial pressure of 15 mmHg. There is no evidence of mitral or tricuspid valve peak veloxity inflow variation, studies assumes respiratory maneuvers are performed. 10. New Biventricular dsyfunction with large, circumferential pericardial effusion. Reaching out to primary team. Comparison(s): No prior Echocardiogram. FINDINGS  Left Ventricle: DP/Dt diminised at 821 mm Hg/s. Left ventricular ejection fraction, by estimation, is 20 to 25%. The left ventricle has severely decreased function. The left ventricle demonstrates global hypokinesis. The left ventricular internal cavity  size was moderately to severely dilated. There is no left ventricular hypertrophy. Left ventricular diastolic parameters are consistent with Grade III diastolic dysfunction (restrictive). Right Ventricle: The right ventricular size is moderately enlarged. No increase in right ventricular wall thickness. Right ventricular systolic function is moderately reduced. There is moderately elevated pulmonary artery systolic pressure. The tricuspid  regurgitant velocity is 2.99 m/s, and with an assumed right atrial pressure of 15 mmHg, the estimated right ventricular  systolic pressure is 50.8 mmHg. Left Atrium: Left atrial size was moderately dilated. Right Atrium: Right atrial size was mildly dilated. Pericardium: A large pericardial effusion is present. Mitral Valve: The mitral valve is normal in structure. Moderate mitral valve regurgitation. Tricuspid Valve: The tricuspid valve is normal in structure. Tricuspid valve regurgitation is moderate. Aortic Valve: The aortic valve is tricuspid. Aortic valve regurgitation is not visualized. Pulmonic Valve: The pulmonic valve was not well visualized. Pulmonic valve regurgitation is trivial. Aorta: The aortic root is normal in size and structure and the ascending aorta was not well visualized. Venous: The pulmonary veins were not well visualized. The inferior vena cava is dilated in size with less than 50% respiratory variability, suggesting right atrial pressure of 15 mmHg. IAS/Shunts: No atrial level shunt detected by color flow Doppler.  LEFT VENTRICLE PLAX 2D LVIDd:         6.60 cm LVIDs:         5.90 cm LV PW:         1.10 cm LV IVS:        0.80 cm LVOT diam:     2.00 cm LV SV:         29 LV SV Index:   13 LVOT Area:     3.14 cm  LV Volumes (MOD) LV vol d, MOD A2C: 194.0 ml LV vol d, MOD A4C: 182.0 ml LV vol s, MOD A2C: 167.0 ml LV vol s, MOD A4C: 134.0 ml LV SV MOD A2C:     27.0 ml LV SV MOD A4C:     182.0 ml LV SV MOD BP:      42.2 ml RIGHT VENTRICLE RV S prime:     8.03 cm/s TAPSE (M-mode): 2.0 cm LEFT ATRIUM              Index       RIGHT ATRIUM           Index LA diam:        5.10 cm  2.30 cm/m  RA Area:  25.40 cm LA Vol (A2C):   94.1 ml  42.52 ml/m RA Volume:   82.10 ml  37.10 ml/m LA Vol (A4C):   96.2 ml  43.47 ml/m LA Biplane Vol: 104.0 ml 46.99 ml/m  AORTIC VALVE LVOT Vmax:   65.90 cm/s LVOT Vmean:  37.100 cm/s LVOT VTI:    0.094 m  AORTA Ao Root diam: 3.60 cm MITRAL VALVE                 TRICUSPID VALVE MV Area (PHT): 8.92 cm      TR Peak grad:   35.8 mmHg MV Decel Time: 85 msec       TR Vmax:        299.00  cm/s MR Peak grad:    63.7 mmHg MR Mean grad:    39.0 mmHg   SHUNTS MR Vmax:         399.00 cm/s Systemic VTI:  0.09 m MR Vmean:        292.0 cm/s  Systemic Diam: 2.00 cm MR PISA:         2.26 cm MR PISA Eff ROA: 14 mm MR PISA Radius:  0.60 cm MV E velocity: 1.05 cm/s MV A velocity: 24.40 cm/s MV E/A ratio:  0.04 Riley Lam MD Electronically signed by Riley Lam MD Signature Date/Time: 02/07/2020/12:44:43 PM    Final         Scheduled Meds: . folic acid  1 mg Oral Daily  . furosemide  60 mg Intravenous BID  . heparin  5,000 Units Subcutaneous Q8H  . metoprolol succinate  25 mg Oral BID  . multivitamin with minerals  1 tablet Oral Daily  . sacubitril-valsartan  1 tablet Oral BID  . sodium chloride flush  3 mL Intravenous Q12H  . thiamine  100 mg Oral Daily   Or  . thiamine  100 mg Intravenous Daily   Continuous Infusions: . sodium chloride       LOS: 2 days    Time spent:    Erick Blinks, MD [facility]   If 7PM-7AM, please contact night-coverage [email or web address]  02/08/2020, 6:26 PM

## 2020-02-09 ENCOUNTER — Telehealth: Payer: Self-pay

## 2020-02-09 ENCOUNTER — Inpatient Hospital Stay (HOSPITAL_COMMUNITY): Payer: Managed Care, Other (non HMO)

## 2020-02-09 DIAGNOSIS — I5021 Acute systolic (congestive) heart failure: Secondary | ICD-10-CM | POA: Diagnosis not present

## 2020-02-09 DIAGNOSIS — I313 Pericardial effusion (noninflammatory): Secondary | ICD-10-CM

## 2020-02-09 DIAGNOSIS — F1010 Alcohol abuse, uncomplicated: Secondary | ICD-10-CM | POA: Diagnosis not present

## 2020-02-09 DIAGNOSIS — E8770 Fluid overload, unspecified: Secondary | ICD-10-CM | POA: Diagnosis not present

## 2020-02-09 LAB — ECHOCARDIOGRAM LIMITED
Area-P 1/2: 4.31 cm2
Height: 68 in
MV M vel: 3.99 m/s
MV Peak grad: 63.7 mmHg
S' Lateral: 5.9 cm
Weight: 3880.1 oz

## 2020-02-09 LAB — BASIC METABOLIC PANEL
Anion gap: 11 (ref 5–15)
BUN: 19 mg/dL (ref 6–20)
CO2: 28 mmol/L (ref 22–32)
Calcium: 8.4 mg/dL — ABNORMAL LOW (ref 8.9–10.3)
Chloride: 98 mmol/L (ref 98–111)
Creatinine, Ser: 0.84 mg/dL (ref 0.61–1.24)
GFR, Estimated: >60 mL/min (ref >60)
Glucose, Bld: 111 mg/dL — ABNORMAL HIGH (ref 70–99)
Potassium: 3.4 mmol/L — ABNORMAL LOW (ref 3.5–5.1)
Sodium: 137 mmol/L (ref 135–145)

## 2020-02-09 MED ORDER — FUROSEMIDE 10 MG/ML IJ SOLN
20.0000 mg | Freq: Once | INTRAMUSCULAR | Status: AC
  Administered 2020-02-09: 20 mg via INTRAVENOUS
  Filled 2020-02-09: qty 2

## 2020-02-09 MED ORDER — POTASSIUM CHLORIDE CRYS ER 20 MEQ PO TBCR
40.0000 meq | EXTENDED_RELEASE_TABLET | Freq: Every day | ORAL | Status: DC
  Administered 2020-02-10 – 2020-02-17 (×8): 40 meq via ORAL
  Filled 2020-02-09 (×8): qty 2

## 2020-02-09 MED ORDER — POTASSIUM CHLORIDE CRYS ER 20 MEQ PO TBCR
40.0000 meq | EXTENDED_RELEASE_TABLET | Freq: Four times a day (QID) | ORAL | Status: AC
  Administered 2020-02-09 (×2): 40 meq via ORAL
  Filled 2020-02-09 (×2): qty 2

## 2020-02-09 MED ORDER — SPIRONOLACTONE 25 MG PO TABS
12.5000 mg | ORAL_TABLET | Freq: Every day | ORAL | Status: DC
  Administered 2020-02-09 – 2020-02-13 (×5): 12.5 mg via ORAL
  Filled 2020-02-09 (×3): qty 0.5
  Filled 2020-02-09: qty 1
  Filled 2020-02-09 (×3): qty 0.5
  Filled 2020-02-09 (×3): qty 1

## 2020-02-09 MED ORDER — METOPROLOL SUCCINATE ER 25 MG PO TB24
25.0000 mg | ORAL_TABLET | Freq: Every day | ORAL | Status: DC
  Administered 2020-02-10 – 2020-02-13 (×4): 25 mg via ORAL
  Filled 2020-02-09 (×4): qty 1

## 2020-02-09 MED ORDER — FUROSEMIDE 10 MG/ML IJ SOLN
80.0000 mg | Freq: Two times a day (BID) | INTRAMUSCULAR | Status: DC
  Administered 2020-02-09 – 2020-02-13 (×8): 80 mg via INTRAVENOUS
  Filled 2020-02-09 (×8): qty 8

## 2020-02-09 NOTE — Progress Notes (Signed)
Nutrition Education Note  RD consulted for nutrition education regarding onset CHF. Patient has history of obesity, daily ETOH abuse, HF. He presents with volume overload/anasarca- EF 20-25%.   RD provided "Low Sodium Nutrition Therapy" handout from the Academy of Nutrition and Dietetics.   Reviewed patient's dietary recall. Patient wife is the main cook for the household. Patient works during the day. Breakfast is eggs and bacon or sausage, juice and usually takes a sandwich for lunch: ham or bologna. Dinner is hot meal: Meatloaf, chicken and vegetable. Hawaiian punch to drink. Add salt to his food.   Provided examples on ways to decrease sodium intake in diet and the importance of portion control.   Discouraged intake of processed foods and use of salt shaker. Encouraged fresh fruits and vegetables as well as whole grain sources of carbohydrates to maximize fiber intake.   RD discussed why it is important for patient to adhere to diet recommendations, and emphasized the role of fluids, foods to avoid, and importance of weighing self daily.  Expect good compliance. Patient verbalized readiness to make changes in food choices and to remove salt shaker from table.   Body mass index is 36.87 kg/m. Pt meets criteria for obesity based on current BMI.  Current diet order is 2 gram sodium, patient is consuming approximately 100% of meals at this time.   Medications: Lasix 80 mg BID, MVI, KCL.  Labs reviewed. BMP Latest Ref Rng & Units 02/09/2020 02/08/2020 02/07/2020  Glucose 70 - 99 mg/dL 341(D) 622(W) 979(G)  BUN 6 - 20 mg/dL 19 92(J) 19(E)  Creatinine 0.61 - 1.24 mg/dL 1.74 0.81 4.48  BUN/Creat Ratio 9 - 20 - - -  Sodium 135 - 145 mmol/L 137 138 137  Potassium 3.5 - 5.1 mmol/L 3.4(L) 4.4 3.9  Chloride 98 - 111 mmol/L 98 99 99  CO2 22 - 32 mmol/L 28 29 29   Calcium 8.9 - 10.3 mg/dL ) 1.8(H) 8.9     Intake/Output Summary (Last 24 hours) at 02/09/2020 1222 Last data filed at 02/09/2020  1143 Gross per 24 hour  Intake 720 ml  Output 3525 ml  Net -2805 ml      No further nutrition interventions warranted at this time. RD contact information provided.    02/11/2020 MS,RD,CSG,LDN Pager: Royann Shivers

## 2020-02-09 NOTE — Telephone Encounter (Signed)
fmla received via fax Copied Noted Sleeved

## 2020-02-09 NOTE — TOC Initial Note (Signed)
Transition of Care [facility]) - Initial/Assessment Note    Patient Details  Name: Craig Hanson MRN: [id number] Date of Birth: 12-05-1970  [facility] CM/SW Contact:    Barry Brunner, LCSW Phone Number: 02/09/2020, 4:27 PM  Clinical Narrative:                 Patient is a 50 year old male admitted for Acute CHF (congestive heart failure). CSW received CHF consult, SA consult, and SNF consult. CSW inquired about patient's ability to monitor salt intake, fluid intake and weight. Patient reported that his CHF diagnosis is a recent diagnosis, but he is able to perform the above task regularly. Patient is also agreeable to review SA resources provide. Patient reported that he would be agreeable to [facility] or SNF but did not feel that it would be required since he has been able to ambulate within the room. TOC to follow.  Expected Discharge Plan: Home/Self Care Barriers to Discharge: Barriers Resolved   Patient Goals and CMS Choice Patient states their goals for this hospitalization and ongoing recovery are:: Return home CMS Medicare.gov Compare Post Acute Care list provided to:: Patient Choice offered to / list presented to : [facility]  Expected Discharge Plan and Services Expected Discharge Plan: Home/Self Care In-house Referral: NA Discharge Planning Services: NA                     DME Arranged: N/A           HH Agency: NA        Prior Living Arrangements/Services   Lives with:: Self,Spouse Patient language and need for interpreter reviewed:: Yes Do you feel safe going back to the place where you live?: Yes      Need for Family Participation in Patient Care: Yes (Comment) Care giver support system in place?: Yes (comment)   Criminal Activity/Legal Involvement Pertinent to Current Situation/Hospitalization: No - Comment as needed  Activities of Daily Living Home Assistive Devices/Equipment: None ADL Screening (condition at time of admission) Patient's  cognitive ability adequate to safely complete daily activities?: Yes Is the patient deaf or have difficulty hearing?: No Does the patient have difficulty seeing, even when wearing glasses/contacts?: No Does the patient have difficulty concentrating, remembering, or making decisions?: No Patient able to express need for assistance with ADLs?: Yes Does the patient have difficulty dressing or bathing?: No Independently performs ADLs?: Yes (appropriate for developmental age) Does the patient have difficulty walking or climbing stairs?: Yes Weakness of Legs: Both Weakness of Arms/Hands: Both  Permission Sought/Granted   Permission granted to share information with : Yes, Verbal Permission Granted              Emotional Assessment     Affect (typically observed): Accepting,Adaptable Orientation: : Oriented to Self,Oriented to Situation,Oriented to Place,Oriented to  Time Alcohol / Substance Use: Not Applicable Psych Involvement: No (comment)  Admission diagnosis:  Acute CHF (congestive heart failure) (HCC) [I50.9] Patient Active Problem List   Diagnosis Date Noted  . Volume overload 02/06/2020  . Acute CHF (congestive heart failure) (HCC) 02/06/2020  . Routine physical examination 01/15/2020  . 3+ pitting edema 01/15/2020  . Screening due 01/15/2020  . Tachycardia 01/15/2020   PCP:  Heather Roberts, NP Pharmacy:   [facility] [phone] - [place], [place] - [address] [place] [address] Phone: [phone] Fax: [phone]     Social Determinants of  Health (SDOH) Interventions    Readmission Risk Interventions No flowsheet data found.

## 2020-02-09 NOTE — Progress Notes (Signed)
PROGRESS NOTE    Craig Hanson  ULA:[id number] DOB: 1970-07-17 DOA: 02/06/2020 PCP: Heather Roberts, NP    Brief Narrative:  50 year old male with history of alcohol abuse, admitted to the hospital with worsening volume overload/anasarca.  He was admitted to the hospital for IV diuresis.  Echocardiogram shows cardiomyopathy with ejection fraction of 20 to 25%.  Cardiology following.   Assessment & Plan:   Active Problems:   Volume overload   Acute CHF (congestive heart failure) (HCC)   Acute systolic congestive heart failure -Patient admitted to the hospital with significant volume overload -He is currently on intravenous Lasix with good urine output -Echocardiogram showed ejection fraction of 20 to 25% -Currently on Toprol and Entresto -Started on Aldactone -Continue to monitor intake and output -Appreciate cardiology assistance, he will need cardiac cath this admission once he is euvolemic  Pericardial effusion -Noted on echocardiogram without any evidence of cardiac tamponade -Echocardiogram repeated on 1/24 with some improvement of pericardial effusion  History of alcohol abuse -Currently on CIWA protocol -No signs of withdrawal at this time -Patient says that he is motivated to stop drinking   DVT prophylaxis: heparin injection 5,000 Units Start: 02/07/20 0600 SCDs Start: 02/06/20 1631  Code Status: Full code Family Communication: Discussed with patient Disposition Plan: Status is: Inpatient.  Will likely need transfer to [facility] for cardiac cath prior to discharge  Remains inpatient appropriate because:IV treatments appropriate due to intensity of illness or inability to take PO   Dispo: The patient is from: Home              Anticipated d/c is to: Home              Anticipated d/c date is: 3 days              Patient currently is not medically stable to d/c.   Difficult to place patient No         Consultants:    Cardiology  Procedures:   Echocardiogram with ejection fraction of 20 to 25% global hypokinesis  Antimicrobials:       Subjective: Continues to complain of lower extremity edema  Objective: Vitals:   02/08/20 2053 02/09/20 0448 02/09/20 0500 02/09/20 1124  BP: 114/86 136/90  118/85  Pulse: (!) 104 (!) 102  (!) 105  Resp: 20 18  18   Temp: 97.7 F (36.5 C)   (!) 97.5 F (36.4 C)  TempSrc: Oral     SpO2: 98% 100%  100%  Weight:   110 kg   Height:        Intake/Output Summary (Last 24 hours) at 02/09/2020 2009 Last data filed at 02/09/2020 1931 Gross per 24 hour  Intake 840 ml  Output 3700 ml  Net -2860 ml   Filed Weights   02/07/20 0401 02/08/20 0500 02/09/20 0500  Weight: 109.4 kg 103.3 kg 110 kg    Examination:  General exam: Alert, awake, oriented x 3 Respiratory system: Clear to auscultation. Respiratory effort normal. Cardiovascular system:RRR. No murmurs, rubs, gallops. Gastrointestinal system: Abdomen is nondistended, soft and nontender. No organomegaly or masses felt. Normal bowel sounds heard. Central nervous system: Alert and oriented. No focal neurological deficits. Extremities: 2-3+ pitting edema bilaterally Skin: No rashes, lesions or ulcers Psychiatry: Judgement and insight appear normal. Mood & affect appropriate.       Data Reviewed: I have personally reviewed following labs and imaging studies  CBC: Recent Labs  Lab 02/06/20 1851 02/07/20 0907 02/08/20 0500  WBC 9.6 9.7 10.0  NEUTROABS 7.9* 7.7 7.9*  HGB 14.0 14.2 13.9  HCT 44.0 44.3 43.9  MCV 96.3 97.4 96.5  PLT 299 288 278   Basic Metabolic Panel: Recent Labs  Lab 02/06/20 1851 02/07/20 0907 02/07/20 1104 02/08/20 0500 02/09/20 0549  NA 133* 137  --  138 137  K 3.5 3.9  --  4.4 3.4*  CL 96* 99  --  99 98  CO2 26 29  --  29 28  GLUCOSE 167* 115*  --  118* 111*  BUN 23* 24*  --  26* 19  CREATININE 1.11 1.00  --  1.03 0.84  CALCIUM 8.4* 8.9  --  8.8* 8.4*  MG 2.2   --  2.2  --   --   PHOS 3.4  --   --   --   --    GFR: Estimated Creatinine Clearance: 125.1 mL/min (by C-G formula based on SCr of 0.84 mg/dL). Liver Function Tests: Recent Labs  Lab 02/06/20 1851 02/07/20 0907 02/08/20 0500  AST 50* 46* 48*  ALT 45* 43 43  ALKPHOS 119 119 120  BILITOT 1.4* 1.4* 1.3*  PROT 6.8 6.5 6.2*  ALBUMIN 3.2* 3.1* 2.9*   No results for input(s): LIPASE, AMYLASE in the last 168 hours. No results for input(s): AMMONIA in the last 168 hours. Coagulation Profile: Recent Labs  Lab 02/06/20 1851  INR 1.5*   Cardiac Enzymes: No results for input(s): CKTOTAL, CKMB, CKMBINDEX, TROPONINI in the last 168 hours. BNP (last 3 results) No results for input(s): PROBNP in the last 8760 hours. HbA1C: No results for input(s): HGBA1C in the last 72 hours. CBG: No results for input(s): GLUCAP in the last 168 hours. Lipid Profile: No results for input(s): CHOL, HDL, LDLCALC, TRIG, CHOLHDL, LDLDIRECT in the last 72 hours. Thyroid Function Tests: No results for input(s): TSH, T4TOTAL, FREET4, T3FREE, THYROIDAB in the last 72 hours. Anemia Panel: No results for input(s): VITAMINB12, FOLATE, FERRITIN, TIBC, IRON, RETICCTPCT in the last 72 hours. Sepsis Labs: No results for input(s): PROCALCITON, LATICACIDVEN in the last 168 hours.  Recent Results (from the past 240 hour(s))  SARS Coronavirus 2 by RT PCR (hospital order, performed in [facility] hospital lab)     Status: None   Collection Time: 02/06/20  3:30 PM  Result Value Ref Range Status   SARS Coronavirus 2 NEGATIVE NEGATIVE Final    Comment: (NOTE) SARS-CoV-2 target nucleic acids are NOT DETECTED.  The SARS-CoV-2 RNA is generally detectable in upper and lower respiratory specimens during the acute phase of infection. The lowest concentration of SARS-CoV-2 viral copies this assay can detect is 250 copies / mL. A negative result does not preclude SARS-CoV-2 infection and should not be used as the sole basis  for treatment or other patient management decisions.  A negative result may occur with improper specimen collection / handling, submission of specimen other than nasopharyngeal swab, presence of viral mutation(s) within the areas targeted by this assay, and inadequate number of viral copies (<250 copies / mL). A negative result must be combined with clinical observations, patient history, and epidemiological information.  Fact Sheet for Patients:   BoilerBrush.com.cy  Fact Sheet for Healthcare Providers: https://pope.com/  This test is not yet approved or  cleared by the [place] FDA and has been authorized for detection and/or diagnosis of SARS-CoV-2 by FDA under an Emergency Use Authorization (EUA).  This EUA will remain in effect (meaning this test can be used) for the  duration of the COVID-19 declaration under Section 564(b)(1) of the Act, 21 U.S.C. section 360bbb-3(b)(1), unless the authorization is terminated or revoked sooner.  Performed at [facility], [address] [place], [place] [address]          Radiology Studies: ECHOCARDIOGRAM LIMITED  Result Date: 02/09/2020    ECHOCARDIOGRAM LIMITED REPORT   Patient Name:   Craig Hanson Date of Exam: 02/09/2020 Medical Rec #:  [id number]          Height:       68.0 in Accession #:    [id number]         Weight:       242.5 lb Date of Birth:  1970/04/17           BSA:          2.218 m Patient Age:    49 years           BP:           118/85 mmHg Patient Gender: M                  HR:           105 bpm. [facility] Procedure: Limited Echo Indications:    Pericardial Effusion  History:        Patient has prior history of Echocardiogram examinations, most                 recent 02/07/2020. CHF; Risk Factors:Non-Smoker.  Sonographer:    Jeryl Columbia RDCS (AE) Referring Phys: [phone] [facility] Murielle Stang IMPRESSIONS  1. Left ventricular ejection fraction, by estimation, is 20  to 25%. The left ventricle has severely decreased function. The left ventricle demonstrates global hypokinesis.  2. Moderate to large pericardial effusion that is circumferential. Compared to 02/07/20 study there is some decrease in size of the effusion. No evidence of tamponade physiology by echo. . There is no evidence of cardiac tamponade.  3. The inferior vena cava is normal in size with greater than 50% respiratory variability, suggesting right atrial pressure of 3 mmHg.  4. Limited echo to evaluate pericardial effusion FINDINGS  Left Ventricle: Left ventricular ejection fraction, by estimation, is 20 to 25%. The left ventricle has severely decreased function. The left ventricle demonstrates global hypokinesis. Pericardium: Moderate to large pericardial effusion that is circumferential. Compared to 02/07/20 study there is some decrease in size of the effusion. No evidence of tamponade physiology by echo. There is no evidence of cardiac tamponade. Venous: The inferior vena cava is normal in size with greater than 50% respiratory variability, suggesting right atrial pressure of 3 mmHg. LEFT VENTRICLE PLAX 2D LVIDd:         6.27 cm  Diastology LVIDs:         5.90 cm  LV e' medial:    5.70 cm/s LV PW:         1.41 cm  LV E/e' medial:  14.0 LV IVS:        1.11 cm  LV e' lateral:   9.15 cm/s LVOT diam:     2.10 cm  LV E/e' lateral: 8.7 LVOT Area:     3.46 cm  LEFT ATRIUM         Index LA diam:    3.40 cm 1.53 cm/m   AORTA Ao Root diam: 3.20 cm MITRAL VALVE               TRICUSPID VALVE MV Area (PHT): 4.31  cm    TR Peak grad:   23.2 mmHg MV Decel Time: 176 msec    TR Vmax:        241.00 cm/s MR Peak grad: 63.7 mmHg MR Mean grad: 37.0 mmHg    SHUNTS MR Vmax:      399.00 cm/s  Systemic Diam: 2.10 cm MR Vmean:     283.0 cm/s MV E velocity: 79.90 cm/s MV A velocity: 35.00 cm/s MV E/A ratio:  2.28 Dina Rich MD Electronically signed by Dina Rich MD Signature Date/Time: 02/09/2020/4:02:26 PM    Final          Scheduled Meds: . folic acid  1 mg Oral Daily  . furosemide  80 mg Intravenous BID  . heparin  5,000 Units Subcutaneous Q8H  . [START ON 02/10/2020] metoprolol succinate  25 mg Oral Daily  . multivitamin with minerals  1 tablet Oral Daily  . [START ON 02/10/2020] potassium chloride  40 mEq Oral Daily  . sacubitril-valsartan  1 tablet Oral BID  . sodium chloride flush  3 mL Intravenous Q12H  . spironolactone  12.5 mg Oral Daily  . thiamine  100 mg Oral Daily   Or  . thiamine  100 mg Intravenous Daily   Continuous Infusions: . sodium chloride       LOS: 3 days    Time spent:    Erick Blinks, MD [facility]   If 7PM-7AM, please contact night-coverage [email or web address]  02/09/2020, 8:09 PM

## 2020-02-09 NOTE — Progress Notes (Signed)
*  PRELIMINARY RESULTS* Echocardiogram 2D Echocardiogram LIMITED has been performed.  Craig Hanson 02/09/2020, 1:56 PM

## 2020-02-09 NOTE — Consult Note (Signed)
Cardiology Consultation:   Patient ID: Craig Hanson MRN: [id number]; DOB: 11-30-1970  Admit date: 02/06/2020 Date of Consult: 02/09/2020  Primary Care Provider: Noreene Larsson, NP [facility] Cardiologist: Rozann Lesches, MD  [place] Electrophysiologist:  None    Patient Profile:   Craig Hanson is a 50 y.o. male with a hx of EtOH abuse who is being seen today for the evaluation of SOB at the request of Dr Roderic Palau.  History of Present Illness:   Craig Hanson 50 yo male history of EtOH abuse (12 beers in a day, vodka shots in the evenings) presents with SOB. Seen by Dr Domenic Polite for outpatient consult Jan 21,2022 for SOB, leg and scrotal edema. Pcp had started lasix and titrated up dose without improvement. He correlates the onset of symptoms to Nov after his second covid shot. Due to severity of symptoms and fluid oveload referred from clinic for admission   Admit labs WBC 9.6 Hgb 14 Plt 299 K 3.5 Cr 1.11 BUN 23 Na 133 AST 50 ALT 45 INR 1.5 BNP 2881 COVID neg EKG sinus tach, nonspecific conduction delay CXR moderate cardiomegaly Echo LVEF 20-25%, global hypokinesis, grade III dd, mod RV dysfunction, mod LAE, large pericardial effusion Abd [place] mild ascites, early mild cirrhosis  Past Medical History:  Diagnosis Date  . Asthma     Past Surgical History:  Procedure Laterality Date  . No prior surgery        Inpatient Medications: Scheduled Meds: . folic acid  1 mg Oral Daily  . furosemide  60 mg Intravenous BID  . heparin  5,000 Units Subcutaneous Q8H  . metoprolol succinate  25 mg Oral BID  . multivitamin with minerals  1 tablet Oral Daily  . sacubitril-valsartan  1 tablet Oral BID  . sodium chloride flush  3 mL Intravenous Q12H  . thiamine  100 mg Oral Daily   Or  . thiamine  100 mg Intravenous Daily   Continuous Infusions: . sodium chloride     PRN Meds: sodium chloride, acetaminophen, LORazepam **OR** LORazepam, ondansetron (ZOFRAN) IV, sodium  chloride flush  Allergies:   No Known Allergies  Social History:   Social History   Socioeconomic History  . Marital status: Married    Spouse name: Not on file  . Number of children: Not on file  . Years of education: Not on file  . 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 Smoking status: Never Smoker  . Smokeless tobacco: Never Used  Vaping Use  . Vaping Use: Never used  Substance and Sexual Activity  . Alcohol use: Yes    Comment: 1 gallon of vodka lasts 2 weeks  . Drug use: Never  . Sexual activity: Not on file  Other Topics Concern  . Not on file  Social History Narrative  . Not on file   Social Determinants of Health   Financial Resource Strain: Not on file  Food Insecurity: Not on file  Transportation Needs: Not on file  Physical Activity: Not on file  Stress: Not on file  Social Connections: Not on file  Intimate Partner Violence: Not on file    Family History:    Family History  Problem Relation Age of Onset  . Hypertension Mother   . Heart disease Father      ROS:  Please see the history of present illness.   All other ROS reviewed and negative.  Physical Exam/Data:   Vitals:   02/08/20 1405 02/08/20 2053 02/09/20 0448 02/09/20 0500  BP: (!) 123/93 114/86 136/90   Pulse: (!) 105 (!) 104 (!) 102   Resp:  20 18   Temp: 97.9 F (36.6 C) 97.7 F (36.5 C)    TempSrc: Oral Oral    SpO2: 100% 98% 100%   Weight:    110 kg  Height:        Intake/Output Summary (Last 24 hours) at 02/09/2020 0827 Last data filed at 02/09/2020 0550 Gross per 24 hour  Intake 960 ml  Output 2725 ml  Net -1765 ml   Last 3 Weights 02/09/2020 02/08/2020 02/07/2020  Weight (lbs) 242 lb 8.1 oz 227 lb 11.8 oz 241 lb 2.9 oz  Weight (kg) 110 kg 103.3 kg 109.4 kg     Body mass index is 36.87 kg/m.  General:  Well nourished, well developed, in no acute distress HEENT: normal Lymph: no  adenopathy Neck: elevated JVD Endocrine:  No thryomegaly Vascular: No carotid bruits; FA pulses 2+ bilaterally without bruits  Cardiac:  normal S1, S2; tachy, 2/6 systolic murmur apex Lungs:  clear to auscultation bilaterally, no wheezing, rhonchi or rales  Abd: soft, nontender, no hepatomegaly  Ext: 2+ bilateral LE edema to thigh Musculoskeletal:  No deformities, BUE and BLE strength normal and equal Skin: warm and dry  Neuro:  CNs 2-12 intact, no focal abnormalities noted Psych:  Normal affect     Laboratory Data:  High Sensitivity Troponin:  No results for input(s): TROPONINIHS in the last 720 hours.   Chemistry Recent Labs  Lab 02/06/20 1851 02/07/20 0907 02/08/20 0500  NA 133* 137 138  K 3.5 3.9 4.4  CL 96* 99 99  CO2 $Re'26 29 29  'Bxs$ GLUCOSE 167* 115* 118*  BUN 23* 24* 26*  CREATININE 1.11 1.00 1.03  CALCIUM 8.4* 8.9 8.8*  GFRNONAA >60 >60 >60  ANIONGAP $RemoveB'11 9 10    'GjbeJaSI$ Recent Labs  Lab 02/06/20 1851 02/07/20 0907 02/08/20 0500  PROT 6.8 6.5 6.2*  ALBUMIN 3.2* 3.1* 2.9*  AST 50* 46* 48*  ALT 45* 43 43  ALKPHOS 119 119 120  BILITOT 1.4* 1.4* 1.3*   Hematology Recent Labs  Lab 02/06/20 1851 02/07/20 0907 02/08/20 0500  WBC 9.6 9.7 10.0  RBC 4.57 4.55 4.55  HGB 14.0 14.2 13.9  HCT 44.0 44.3 43.9  MCV 96.3 97.4 96.5  MCH 30.6 31.2 30.5  MCHC 31.8 32.1 31.7  RDW 16.4* 16.6* 16.7*  PLT 299 288 278   BNP Recent Labs  Lab 02/06/20 1851  BNP 2,881.0*    DDimer No results for input(s): DDIMER in the last 168 hours.   Radiology/Studies:  US Abdomen Complete  Result Date: 02/07/2020 CLINICAL DATA:  Ascites, swelling of abdomen and lower extremities EXAM: ABDOMEN ULTRASOUND COMPLETE COMPARISON:  None. FINDINGS: Gallbladder: No gallstones. Mild layering gallbladder sludge. Gallbladder wall edema, measuring up to 8 mm. Negative sonographic Murphy's sign. Common bile duct: Diameter: 4 mm Liver: Hyperechoic hepatic parenchyma. Mildly nodular hepatic contour. No  focal hepatic lesion is seen. Portal vein is patent on color Doppler imaging with normal direction of blood flow towards the liver. IVC: No abnormality visualized. Pancreas: Poorly visualized due to overlying bowel gas. Spleen: Size and appearance within normal limits. Right Kidney: Length: 11.9 cm. Echogenicity within normal limits. No mass or hydronephrosis visualized. Left Kidney: Length: 11.1 cm. Echogenicity within normal limits. No mass or hydronephrosis visualized. Abdominal aorta: No aneurysm visualized. Other findings: Small  volume perihepatic and perisplenic ascites. IMPRESSION: Gallbladder wall edema, without associated sonographic findings to suggest acute cholecystitis. This appearance is likely secondary to right upper quadrant inflammation or hypoalbuminemia. Mild upper abdominal ascites. Hyperechoic hepatic parenchyma with a mildly nodular hepatic contour, raising the possibility of mild cirrhosis and/or hepatic steatosis. Electronically Signed   By: Julian Hy M.D.   On: 02/07/2020 12:02   DG Chest Port 1 View  Result Date: 02/07/2020 CLINICAL DATA:  Asthma, dyspnea EXAM: PORTABLE CHEST 1 VIEW COMPARISON:  None. FINDINGS: Lungs are clear. No pneumothorax or pleural effusion. Moderate cardiomegaly is present. Pulmonary vascularity is normal. No acute bone abnormality. IMPRESSION: Moderate cardiomegaly. Electronically Signed   By: Fidela Salisbury MD   On: 02/07/2020 05:28   ECHOCARDIOGRAM COMPLETE  Result Date: 02/07/2020    ECHOCARDIOGRAM REPORT   Patient Name:   Craig Hanson Date of Exam: 02/07/2020 Medical Rec #:  [id number]          Height:       68.0 in Accession #:    [id number]         Weight:       241.2 lb Date of Birth:  [birth year]           BSA:          2.213 m Patient Age:    83 years           BP:           133/91 mmHg Patient Gender: M                  HR:           103 bpm. Exam Location:  [facility] Procedure: 2D Echo, Cardiac Doppler and Color Doppler Indications:     Dyspnea R06.00  History:        Patient has no prior history of Echocardiogram examinations.                 CHF. 3+ pitting edema.  Sonographer:    Alvino Chapel RCS Referring Phys: [place]  1. Left ventricular ejection fraction, by estimation, is 20 to 25%. The left ventricle has severely decreased function. The left ventricle demonstrates global hypokinesis. The left ventricular internal cavity size was moderately to severely dilated. Left ventricular diastolic parameters are consistent with Grade III diastolic dysfunction (restrictive).  2. Right ventricular systolic function is moderately reduced. The right ventricular size is moderately enlarged. There is moderately elevated pulmonary artery systolic pressure.  3. Left atrial size was moderately dilated.  4. Right atrial size was mildly dilated.  5. Large pericardial effusion.  6. The mitral valve is normal in structure. Moderate mitral valve regurgitation.  7. Tricuspid valve regurgitation is moderate.  8. The aortic valve is tricuspid. Aortic valve regurgitation is not visualized.  9. The inferior vena cava is dilated in size with <50% respiratory variability, suggesting right atrial pressure of 15 mmHg. There is no evidence of mitral or tricuspid valve peak veloxity inflow variation, studies assumes respiratory maneuvers are performed. 10. New Biventricular dsyfunction with large, circumferential pericardial effusion. Reaching out to primary team. Comparison(s): No prior Echocardiogram. FINDINGS  Left Ventricle: DP/Dt diminised at 821 mm Hg/s. Left ventricular ejection fraction, by estimation, is 20 to 25%. The left ventricle has severely decreased function. The left ventricle demonstrates global hypokinesis. The left ventricular internal cavity  size was moderately to severely dilated. There is no left ventricular hypertrophy. Left ventricular diastolic parameters  are consistent with Grade III diastolic dysfunction  (restrictive). Right Ventricle: The right ventricular size is moderately enlarged. No increase in right ventricular wall thickness. Right ventricular systolic function is moderately reduced. There is moderately elevated pulmonary artery systolic pressure. The tricuspid  regurgitant velocity is 2.99 m/s, and with an assumed right atrial pressure of 15 mmHg, the estimated right ventricular systolic pressure is 09.2 mmHg. Left Atrium: Left atrial size was moderately dilated. Right Atrium: Right atrial size was mildly dilated. Pericardium: A large pericardial effusion is present. Mitral Valve: The mitral valve is normal in structure. Moderate mitral valve regurgitation. Tricuspid Valve: The tricuspid valve is normal in structure. Tricuspid valve regurgitation is moderate. Aortic Valve: The aortic valve is tricuspid. Aortic valve regurgitation is not visualized. Pulmonic Valve: The pulmonic valve was not well visualized. Pulmonic valve regurgitation is trivial. Aorta: The aortic root is normal in size and structure and the ascending aorta was not well visualized. Venous: The pulmonary veins were not well visualized. The inferior vena cava is dilated in size with less than 50% respiratory variability, suggesting right atrial pressure of 15 mmHg. IAS/Shunts: No atrial level shunt detected by color flow Doppler.  LEFT VENTRICLE PLAX 2D LVIDd:         6.60 cm LVIDs:         5.90 cm LV PW:         1.10 cm LV IVS:        0.80 cm LVOT diam:     2.00 cm LV SV:         29 LV SV Index:   13 LVOT Area:     3.14 cm  LV Volumes (MOD) LV vol d, MOD A2C: 194.0 ml LV vol d, MOD A4C: 182.0 ml LV vol s, MOD A2C: 167.0 ml LV vol s, MOD A4C: 134.0 ml LV SV MOD A2C:     27.0 ml LV SV MOD A4C:     182.0 ml LV SV MOD BP:      42.2 ml RIGHT VENTRICLE RV S prime:     8.03 cm/s TAPSE (M-mode): 2.0 cm LEFT ATRIUM              Index       RIGHT ATRIUM           Index LA diam:        5.10 cm  2.30 cm/m  RA Area:     25.40 cm LA Vol (A2C):   94.1  ml  42.52 ml/m RA Volume:   82.10 ml  37.10 ml/m LA Vol (A4C):   96.2 ml  43.47 ml/m LA Biplane Vol: 104.0 ml 46.99 ml/m  AORTIC VALVE LVOT Vmax:   65.90 cm/s LVOT Vmean:  37.100 cm/s LVOT VTI:    0.094 m  AORTA Ao Root diam: 3.60 cm MITRAL VALVE                 TRICUSPID VALVE MV Area (PHT): 8.92 cm      TR Peak grad:   35.8 mmHg MV Decel Time: 85 msec       TR Vmax:        299.00 cm/s MR Peak grad:    63.7 mmHg MR Mean grad:    39.0 mmHg   SHUNTS MR Vmax:         399.00 cm/s Systemic VTI:  0.09 m MR Vmean:        292.0 cm/s  Systemic Diam: 2.00 cm MR PISA:  2.26 cm MR PISA Eff ROA: 14 mm MR PISA Radius:  0.60 cm MV E velocity: 1.05 cm/s MV A velocity: 24.40 cm/s MV E/A ratio:  0.04 Rudean Haskell MD Electronically signed by Rudean Haskell MD Signature Date/Time: 02/07/2020/12:44:43 PM    Final      Assessment and Plan:   1. Acute biventricular heart failure - neg 1.8 L yesterday, neg 3.2 L since admission. He is on IV lasix 60mg  bid, he is on IV lasix 60mg  bid. Renal function is stable. Increase IV lasix to 80mg  bid, remains severely volume overloaded.   medical therapy with entresto 24/26mg  bid, toprol 25mg  bid - would lower toprol to 25mg  daily, would introduce very gradually given severe dysfunction, ongoing fluid overload, and sinus tach suggesting possible significantly decreased low CO - start aldactone 12.5mg  daily  - worrisome presentation for very low CO in setting of severe LV dysfunction by echo and persistent sinus tachycardia. He is diuresing well with just IV lasix with stable renal function. Continue diuresis, optimize medical therapy. Will need RHC/LHC this admission given degree of dysfunction once he is euvolemic.  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 Symptoms started after his Nov COVID vaccination though I think this is coincidental, LAVI of 44 would suggest fairly long standing elevated filling pressures.    2. Pericardial effusion - recheck limited  echo tomorrow - check TSH, CRP, ESR - echo reviewed, no evidence of tamponade. Tachycardia likely due to significant CHF.      For questions or updates, please contact [place] Please consult [email or web address] for contact info under    Signed, Carlyle Dolly, MD  02/09/2020 8:27 AM

## 2020-02-10 DIAGNOSIS — I5021 Acute systolic (congestive) heart failure: Secondary | ICD-10-CM | POA: Diagnosis not present

## 2020-02-10 DIAGNOSIS — F1010 Alcohol abuse, uncomplicated: Secondary | ICD-10-CM | POA: Diagnosis not present

## 2020-02-10 DIAGNOSIS — R609 Edema, unspecified: Secondary | ICD-10-CM

## 2020-02-10 DIAGNOSIS — E8770 Fluid overload, unspecified: Secondary | ICD-10-CM | POA: Diagnosis not present

## 2020-02-10 LAB — BASIC METABOLIC PANEL
Anion gap: 8 (ref 5–15)
BUN: 15 mg/dL (ref 6–20)
CO2: 30 mmol/L (ref 22–32)
Calcium: 8.6 mg/dL — ABNORMAL LOW (ref 8.9–10.3)
Chloride: 97 mmol/L — ABNORMAL LOW (ref 98–111)
Creatinine, Ser: 0.81 mg/dL (ref 0.61–1.24)
GFR, Estimated: >60 mL/min (ref >60)
Glucose, Bld: 133 mg/dL — ABNORMAL HIGH (ref 70–99)
Potassium: 3.7 mmol/L (ref 3.5–5.1)
Sodium: 135 mmol/L (ref 135–145)

## 2020-02-10 LAB — SEDIMENTATION RATE: Sed Rate: 42 mm/hr — ABNORMAL HIGH (ref 0–16)

## 2020-02-10 LAB — MAGNESIUM: Magnesium: 2 mg/dL (ref 1.7–2.4)

## 2020-02-10 LAB — TSH: TSH: 3.364 u[IU]/mL (ref 0.350–4.500)

## 2020-02-10 MED ORDER — POTASSIUM CHLORIDE CRYS ER 20 MEQ PO TBCR
40.0000 meq | EXTENDED_RELEASE_TABLET | Freq: Once | ORAL | Status: AC
  Administered 2020-02-10: 40 meq via ORAL
  Filled 2020-02-10: qty 2

## 2020-02-10 NOTE — Progress Notes (Signed)
PROGRESS NOTE    Craig Hanson  HLK:[id number] DOB: May 19, 1970 DOA: 02/06/2020 PCP: Heather Roberts, NP    Brief Narrative:  50 year old male with history of alcohol abuse, admitted to the hospital with worsening volume overload/anasarca.  He was admitted to the hospital for IV diuresis.  Echocardiogram shows cardiomyopathy with ejection fraction of 20 to 25%.  Cardiology following.   Assessment & Plan:   Active Problems:   Volume overload   Acute CHF (congestive heart failure) (HCC)   Acute systolic congestive heart failure -Patient admitted to the hospital with significant volume overload -He is currently on intravenous Lasix with good urine output -Echocardiogram showed ejection fraction of 20 to 25% -Currently on Toprol and Entresto -Started on Aldactone -Continue to monitor intake and output -Appreciate cardiology assistance, he will need cardiac cath this admission once he is euvolemic  Pericardial effusion -Noted on echocardiogram without any evidence of cardiac tamponade -Echocardiogram repeated on 1/24 with some improvement of pericardial effusion  History of alcohol abuse -Currently on CIWA protocol -No signs of withdrawal at this time -Patient says that he is motivated to stop drinking   DVT prophylaxis: heparin injection 5,000 Units Start: 02/07/20 0600 SCDs Start: 02/06/20 1631  Code Status: Full code Family Communication: Discussed with patient Disposition Plan: Status is: Inpatient.  Will need transfer to [facility] for cardiac cath prior to discharge, hopefully by the end of the week  Remains inpatient appropriate because:IV treatments appropriate due to intensity of illness or inability to take PO   Dispo: The patient is from: Home              Anticipated d/c is to: Home              Anticipated d/c date is: 3 days              Patient currently is not medically stable to d/c.   Difficult to place patient  No         Consultants:   Cardiology  Procedures:   Echocardiogram with ejection fraction of 20 to 25% global hypokinesis  Antimicrobials:       Subjective: Continues to have lower extremity edema.  Not able to lay completely flat due to orthopnea  Objective: Vitals:   02/09/20 2102 02/10/20 0544 02/10/20 0554 02/10/20 1348  BP: 116/80 (!) 125/94  112/83  Pulse: (!) 109 (!) 108  (!) 106  Resp: 18 18  18   Temp: 97.7 F (36.5 C) 97.6 F (36.4 C)  (!) 97.4 F (36.3 C)  TempSrc: Oral Oral  Oral  SpO2: 95% 96%  100%  Weight:   108.1 kg   Height:        Intake/Output Summary (Last 24 hours) at 02/10/2020 1930 Last data filed at 02/10/2020 1800 Gross per 24 hour  Intake 1080 ml  Output 2825 ml  Net -1745 ml   Filed Weights   02/08/20 0500 02/09/20 0500 02/10/20 0554  Weight: 103.3 kg 110 kg 108.1 kg    Examination:  General exam: Alert, awake, oriented x 3 Respiratory system: Clear to auscultation. Respiratory effort normal. Cardiovascular system:RRR. No murmurs, rubs, gallops. Gastrointestinal system: Abdomen is nondistended, soft and nontender. No organomegaly or masses felt. Normal bowel sounds heard. Central nervous system: Alert and oriented. No focal neurological deficits. Extremities: 2+ pitting edema bilaterally Skin: No rashes, lesions or ulcers Psychiatry: Judgement and insight appear normal. Mood & affect appropriate.        Data Reviewed: I  have personally reviewed following labs and imaging studies  CBC: Recent Labs  Lab 02/06/20 1851 02/07/20 0907 02/08/20 0500  WBC 9.6 9.7 10.0  NEUTROABS 7.9* 7.7 7.9*  HGB 14.0 14.2 13.9  HCT 44.0 44.3 43.9  MCV 96.3 97.4 96.5  PLT 299 288 278   Basic Metabolic Panel: Recent Labs  Lab 02/06/20 1851 02/07/20 0907 02/07/20 1104 02/08/20 0500 02/09/20 0549 02/10/20 0542  NA 133* 137  --  138 137 135  K 3.5 3.9  --  4.4 3.4* 3.7  CL 96* 99  --  99 98 97*  CO2 26 29  --  29 28 30    GLUCOSE 167* 115*  --  118* 111* 133*  BUN 23* 24*  --  26* 19 15  CREATININE 1.11 1.00  --  1.03 0.84 0.81  CALCIUM 8.4* 8.9  --  8.8* 8.4* 8.6*  MG 2.2  --  2.2  --   --  2.0  PHOS 3.4  --   --   --   --   --    GFR: Estimated Creatinine Clearance: 128.6 mL/min (by C-G formula based on SCr of 0.81 mg/dL). Liver Function Tests: Recent Labs  Lab 02/06/20 1851 02/07/20 0907 02/08/20 0500  AST 50* 46* 48*  ALT 45* 43 43  ALKPHOS 119 119 120  BILITOT 1.4* 1.4* 1.3*  PROT 6.8 6.5 6.2*  ALBUMIN 3.2* 3.1* 2.9*   No results for input(s): LIPASE, AMYLASE in the last 168 hours. No results for input(s): AMMONIA in the last 168 hours. Coagulation Profile: Recent Labs  Lab 02/06/20 1851  INR 1.5*   Cardiac Enzymes: No results for input(s): CKTOTAL, CKMB, CKMBINDEX, TROPONINI in the last 168 hours. BNP (last 3 results) No results for input(s): PROBNP in the last 8760 hours. HbA1C: No results for input(s): HGBA1C in the last 72 hours. CBG: No results for input(s): GLUCAP in the last 168 hours. Lipid Profile: No results for input(s): CHOL, HDL, LDLCALC, TRIG, CHOLHDL, LDLDIRECT in the last 72 hours. Thyroid Function Tests: Recent Labs    02/10/20 0542  TSH 3.364   Anemia Panel: No results for input(s): VITAMINB12, FOLATE, FERRITIN, TIBC, IRON, RETICCTPCT in the last 72 hours. Sepsis Labs: No results for input(s): PROCALCITON, LATICACIDVEN in the last 168 hours.  Recent Results (from the past 240 hour(s))  SARS Coronavirus 2 by RT PCR (hospital order, performed in [facility] hospital lab)     Status: None   Collection Time: 02/06/20  3:30 PM  Result Value Ref Range Status   SARS Coronavirus 2 NEGATIVE NEGATIVE Final    Comment: (NOTE) SARS-CoV-2 target nucleic acids are NOT DETECTED.  The SARS-CoV-2 RNA is generally detectable in upper and lower respiratory specimens during the acute phase of infection. The lowest concentration of SARS-CoV-2 viral copies this assay can  detect is 250 copies / mL. A negative result does not preclude SARS-CoV-2 infection and should not be used as the sole basis for treatment or other patient management decisions.  A negative result may occur with improper specimen collection / handling, submission of specimen other than nasopharyngeal swab, presence of viral mutation(s) within the areas targeted by this assay, and inadequate number of viral copies (<250 copies / mL). A negative result must be combined with clinical observations, patient history, and epidemiological information.  Fact Sheet for Patients:   02/08/20  Fact Sheet for Healthcare Providers: BoilerBrush.com.cy  This test is not yet approved or  cleared by the https://pope.com/ FDA  and has been authorized for detection and/or diagnosis of SARS-CoV-2 by FDA under an Emergency Use Authorization (EUA).  This EUA will remain in effect (meaning this test can be used) for the duration of the COVID-19 declaration under Section 564(b)(1) of the Act, 21 U.S.C. section 360bbb-3(b)(1), unless the authorization is terminated or revoked sooner.  Performed at [facility], [address] [place], [place] [address]          Radiology Studies: ECHOCARDIOGRAM LIMITED  Result Date: 02/09/2020    ECHOCARDIOGRAM LIMITED REPORT   Patient Name:   JORDELL GIBBINS Date of Exam: 02/09/2020 Medical Rec #:  [id number]          Height:       68.0 in Accession #:    [id number]         Weight:       242.5 lb Date of Birth:  January 01, 1971           BSA:          2.218 m Patient Age:    49 years           BP:           118/85 mmHg Patient Gender: M                  HR:           105 bpm. [facility] Procedure: Limited Echo Indications:    Pericardial Effusion  History:        Patient has prior history of Echocardiogram examinations, most                 recent 02/07/2020. CHF; Risk Factors:Non-Smoker.  Sonographer:    Jeryl Columbia RDCS (AE) Referring Phys: [phone] [facility] IMPRESSIONS  1. Left ventricular ejection fraction, by estimation, is 20 to 25%. The left ventricle has severely decreased function. The left ventricle demonstrates global hypokinesis.  2. Moderate to large pericardial effusion that is circumferential. Compared to 02/07/20 study there is some decrease in size of the effusion. No evidence of tamponade physiology by echo. . There is no evidence of cardiac tamponade.  3. The inferior vena cava is normal in size with greater than 50% respiratory variability, suggesting right atrial pressure of 3 mmHg.  4. Limited echo to evaluate pericardial effusion FINDINGS  Left Ventricle: Left ventricular ejection fraction, by estimation, is 20 to 25%. The left ventricle has severely decreased function. The left ventricle demonstrates global hypokinesis. Pericardium: Moderate to large pericardial effusion that is circumferential. Compared to 02/07/20 study there is some decrease in size of the effusion. No evidence of tamponade physiology by echo. There is no evidence of cardiac tamponade. Venous: The inferior vena cava is normal in size with greater than 50% respiratory variability, suggesting right atrial pressure of 3 mmHg. LEFT VENTRICLE PLAX 2D LVIDd:         6.27 cm  Diastology LVIDs:         5.90 cm  LV e' medial:    5.70 cm/s LV PW:         1.41 cm  LV E/e' medial:  14.0 LV IVS:        1.11 cm  LV e' lateral:   9.15 cm/s LVOT diam:     2.10 cm  LV E/e' lateral: 8.7 LVOT Area:     3.46 cm  LEFT ATRIUM         Index LA diam:    3.40  cm 1.53 cm/m   AORTA Ao Root diam: 3.20 cm MITRAL VALVE               TRICUSPID VALVE MV Area (PHT): 4.31 cm    TR Peak grad:   23.2 mmHg MV Decel Time: 176 msec    TR Vmax:        241.00 cm/s MR Peak grad: 63.7 mmHg MR Mean grad: 37.0 mmHg    SHUNTS MR Vmax:      399.00 cm/s  Systemic Diam: 2.10 cm MR Vmean:     283.0 cm/s MV E velocity: 79.90 cm/s MV A velocity: 35.00 cm/s MV E/A ratio:  2.28  Dina Rich MD Electronically signed by Dina Rich MD Signature Date/Time: 02/09/2020/4:02:26 PM    Final         Scheduled Meds: . folic acid  1 mg Oral Daily  . furosemide  80 mg Intravenous BID  . heparin  5,000 Units Subcutaneous Q8H  . metoprolol succinate  25 mg Oral Daily  . multivitamin with minerals  1 tablet Oral Daily  . potassium chloride  40 mEq Oral Daily  . sacubitril-valsartan  1 tablet Oral BID  . sodium chloride flush  3 mL Intravenous Q12H  . spironolactone  12.5 mg Oral Daily  . thiamine  100 mg Oral Daily   Or  . thiamine  100 mg Intravenous Daily   Continuous Infusions: . sodium chloride       LOS: 4 days    Time spent:    Erick Blinks, MD [facility]   If 7PM-7AM, please contact night-coverage [email or web address]  02/10/2020, 7:30 PM

## 2020-02-10 NOTE — Progress Notes (Signed)
Progress Note  Patient Name: Craig Hanson Date of Encounter: 02/10/2020  [facility] Cardiologist: Rozann Lesches, MD   Subjective   SOB improving.   Inpatient Medications    Scheduled Meds: . folic acid  1 mg Oral Daily  . furosemide  80 mg Intravenous BID  . heparin  5,000 Units Subcutaneous Q8H  . metoprolol succinate  25 mg Oral Daily  . multivitamin with minerals  1 tablet Oral Daily  . potassium chloride  40 mEq Oral Daily  . sacubitril-valsartan  1 tablet Oral BID  . sodium chloride flush  3 mL Intravenous Q12H  . spironolactone  12.5 mg Oral Daily  . thiamine  100 mg Oral Daily   Or  . thiamine  100 mg Intravenous Daily   Continuous Infusions: . sodium chloride     PRN Meds: sodium chloride, acetaminophen, ondansetron (ZOFRAN) IV, sodium chloride flush   Vital Signs    Vitals:   02/09/20 1124 02/09/20 2102 02/10/20 0544 02/10/20 0554  BP: 118/85 116/80 (!) 125/94   Pulse: (!) 105 (!) 109 (!) 108   Resp: $Remo'18 18 18   'WhyDR$ Temp: (!) 97.5 F (36.4 C) 97.7 F (36.5 C) 97.6 F (36.4 C)   TempSrc:  Oral Oral   SpO2: 100% 95% 96%   Weight:    108.1 kg  Height:        Intake/Output Summary (Last 24 hours) at 02/10/2020 0938 Last data filed at 02/10/2020 0554 Gross per 24 hour  Intake 600 ml  Output 4000 ml  Net -3400 ml   Last 3 Weights 02/10/2020 02/09/2020 02/08/2020  Weight (lbs) 238 lb 6.4 oz 242 lb 8.1 oz 227 lb 11.8 oz  Weight (kg) 108.138 kg 110 kg 103.3 kg      Telemetry    Mild sinus tach - Personally Reviewed  ECG    n/a - Personally Reviewed  Physical Exam   GEN: No acute distress.   Neck: No JVD Cardiac: RRR, no murmurs, rubs, or gallops.  Respiratory: Clear to auscultation bilaterally. GI: Soft, nontender, non-distended  MS: No edema; No deformity. Neuro:  Nonfocal  Psych: Normal affect   Labs    High Sensitivity Troponin:  No results for input(s): TROPONINIHS in the last 720 hours.    Chemistry Recent Labs  Lab  02/06/20 1851 02/07/20 0907 02/08/20 0500 02/09/20 0549 02/10/20 0542  NA 133* 137 138 137 135  K 3.5 3.9 4.4 3.4* 3.7  CL 96* 99 99 98 97*  CO2 $Re'26 29 29 28 30  'NHY$ GLUCOSE 167* 115* 118* 111* 133*  BUN 23* 24* 26* 19 15  CREATININE 1.11 1.00 1.03 0.84 0.81  CALCIUM 8.4* 8.9 8.8* 8.4* 8.6*  PROT 6.8 6.5 6.2*  --   --   ALBUMIN 3.2* 3.1* 2.9*  --   --   AST 50* 46* 48*  --   --   ALT 45* 43 43  --   --   ALKPHOS 119 119 120  --   --   BILITOT 1.4* 1.4* 1.3*  --   --   GFRNONAA >60 >60 >60 >60 >60  ANIONGAP $RemoveB'11 9 10 11 8     'PVrImZkb$ Hematology Recent Labs  Lab 02/06/20 1851 02/07/20 0907 02/08/20 0500  WBC 9.6 9.7 10.0  RBC 4.57 4.55 4.55  HGB 14.0 14.2 13.9  HCT 44.0 44.3 43.9  MCV 96.3 97.4 96.5  MCH 30.6 31.2 30.5  MCHC 31.8 32.1 31.7  RDW 16.4* 16.6* 16.7*  PLT 299 288 278    BNP Recent Labs  Lab 02/06/20 1851  BNP 2,881.0*     DDimer No results for input(s): DDIMER in the last 168 hours.   Radiology    ECHOCARDIOGRAM LIMITED  Result Date: 02/09/2020    ECHOCARDIOGRAM LIMITED REPORT   Patient Name:   Craig Hanson Date of Exam: 02/09/2020 Medical Rec #:  [id number]          Height:       68.0 in Accession #:    [id number]         Weight:       242.5 lb Date of Birth:  1970-07-13           BSA:          2.218 m Patient Age:    50 years           BP:           118/85 mmHg Patient Gender: M                  HR:           105 bpm. [facility]am Location:  [facility] Procedure: Limited Echo Indications:    Pericardial Effusion  History:        Patient has prior history of Echocardiogram examinations, most                 recent 02/07/2020. CHF; Risk Factors:Non-Smoker.  Sonographer:    Leavy Cella RDCS (AE) Referring Phys: [place]  1. Left ventricular ejection fraction, by estimation, is 20 to 25%. The left ventricle has severely decreased function. The left ventricle demonstrates global hypokinesis.  2. Moderate to large pericardial effusion that is  circumferential. Compared to 02/07/20 study there is some decrease in size of the effusion. No evidence of tamponade physiology by echo. . There is no evidence of cardiac tamponade.  3. The inferior vena cava is normal in size with greater than 50% respiratory variability, suggesting right atrial pressure of 3 mmHg.  4. Limited echo to evaluate pericardial effusion FINDINGS  Left Ventricle: Left ventricular ejection fraction, by estimation, is 20 to 25%. The left ventricle has severely decreased function. The left ventricle demonstrates global hypokinesis. Pericardium: Moderate to large pericardial effusion that is circumferential. Compared to 02/07/20 study there is some decrease in size of the effusion. No evidence of tamponade physiology by echo. There is no evidence of cardiac tamponade. Venous: The inferior vena cava is normal in size with greater than 50% respiratory variability, suggesting right atrial pressure of 3 mmHg. LEFT VENTRICLE PLAX 2D LVIDd:         6.27 cm  Diastology LVIDs:         5.90 cm  LV e' medial:    5.70 cm/s LV PW:         1.41 cm  LV E/e' medial:  14.0 LV IVS:        1.11 cm  LV e' lateral:   9.15 cm/s LVOT diam:     2.10 cm  LV E/e' lateral: 8.7 LVOT Area:     3.46 cm  LEFT ATRIUM         Index LA diam:    3.40 cm 1.53 cm/m   AORTA Ao Root diam: 3.20 cm MITRAL VALVE               TRICUSPID VALVE MV Area (PHT): 4.31 cm    TR Peak grad:  23.2 mmHg MV Decel Time: 176 msec    TR Vmax:        241.00 cm/s MR Peak grad: 63.7 mmHg MR Mean grad: 37.0 mmHg    SHUNTS MR Vmax:      399.00 cm/s  Systemic Diam: 2.10 cm MR Vmean:     283.0 cm/s MV E velocity: 79.90 cm/s MV A velocity: 35.00 cm/s MV E/A ratio:  2.28 Carlyle Dolly MD Electronically signed by Carlyle Dolly MD Signature Date/Time: 02/09/2020/4:02:26 PM    Final     Cardiac Studies     Patient Profile   Craig Hanson is a 50 y.o. male with a hx of EtOH abuse who is being seen today for the evaluation of SOB at the  request of Dr Roderic Palau.  Assessment & Plan    1. Acute biventricular heart failure - neg 3.1 L yesterday, neg 6.3 L since admission. He is on IV lasix $Remove'80mg'yHARYhK$  bid Renal function is stable.  medical therapy with entresto 24/$RemoveBeforeDE'26mg'ZYUZYFDEpVjhZDi$  bid, toprol $RemoveBef'25mg'DUkqOQaNfk$  aldactone 12.5 - would lower toprol to $RemoveB'25mg'owgikxQO$  daily, would introduce very gradually given severe   - worrisome presentation for very low CO in setting of severe LV dysfunction by echo and persistent sinus tachycardia. He is diuresing well with just IV lasix with stable renal function. Continue diuresis - Will need RHC/LHC this admission given degree of dysfunction once he is euvolemic.  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 Symptoms started after his Nov COVID vaccination though I think this is coincidental, LAVI of 44 would suggest fairly long standing elevated filling pressures.   - ongoing severe edema, continue IV diuresis. Would anticipate likely end of the week that he is ready for cath.    2. Pericardial effusion - large by initial echo, repeat echo shows improving though still moderate to large. No tamponade findings - ESR 42, CRP pending, TSH 3.3 - perhaps inflammatory/post infectious - appears to be improving by serial imaging.   For questions or updates, please contact [place] Please consult [email or web address] for contact info under        Signed, Carlyle Dolly, MD  02/10/2020, 9:38 AM

## 2020-02-10 NOTE — Telephone Encounter (Signed)
Complete

## 2020-02-11 DIAGNOSIS — E669 Obesity, unspecified: Secondary | ICD-10-CM

## 2020-02-11 DIAGNOSIS — I5021 Acute systolic (congestive) heart failure: Secondary | ICD-10-CM | POA: Diagnosis not present

## 2020-02-11 LAB — BASIC METABOLIC PANEL
Anion gap: 11 (ref 5–15)
BUN: 17 mg/dL (ref 6–20)
CO2: 29 mmol/L (ref 22–32)
Calcium: 8.6 mg/dL — ABNORMAL LOW (ref 8.9–10.3)
Chloride: 97 mmol/L — ABNORMAL LOW (ref 98–111)
Creatinine, Ser: 0.9 mg/dL (ref 0.61–1.24)
GFR, Estimated: >60 mL/min (ref >60)
Glucose, Bld: 121 mg/dL — ABNORMAL HIGH (ref 70–99)
Potassium: 4.2 mmol/L (ref 3.5–5.1)
Sodium: 137 mmol/L (ref 135–145)

## 2020-02-11 LAB — HIGH SENSITIVITY CRP: CRP, High Sensitivity: 69.94 mg/L — ABNORMAL HIGH (ref 0.00–3.00)

## 2020-02-11 NOTE — Progress Notes (Signed)
PROGRESS NOTE  Craig Hanson PTW:[id number] DOB: 06/28/1970 DOA: 02/06/2020 PCP: Noreene Larsson, NP  Brief History:  50 y.o. male, past medical history of asthma childhood, alcohol abuse, patient Care with PCP in December 2021, who referred him for cardiology evaluation, patient reports shortness of breath, orthopnea, progressive over last 45-month, as well shortness of breath, mainly orthopnea, currently exertional as well, progressive as well, he was sent to cardiology for further evaluation, he was prescribed Lasix, despite that he continues to have worsening edema, patient reports history of alcohol abuse, used to drink up to 12 beers in a day, reports he cut back on that, he is only drinking hard liquor now, vodka, 3-4 shots about 3 nights a week, report he is cutting back on that, denies fever, chills, chest pain. -In cardiology office patient was noted to have significant edema in lower extremity, scrotal edema, abdominal wall edema, so direct admission was requested for further work-up and diuresis   Assessment/Plan: Acute systolic/Biventricular CHF -remains clinically fluid overloaded -continue IV lasix 80 mg q 12 -I/Os incomplete, but appears neg 6.6L -01/2420 Echo  EF 20-25%, global HK, moderate to large pericardial effusion without tamponade -continue entresto 24/28 and metoprolol succinate -continue spironolactone -RHC/LHC once euvolemic -appreciate cardiology follow up  Pericardial effusion -02/09/20 limited echo--slightly smaller, no tamponade -ESR 42, CRP 69.9 -TSH 3.364  alcohol abuse -Currently on CIWA protocol -No signs of withdrawal at this time -Patient says that he is motivated to stop drinking  Morbid Obesity -BMI 35.67 -lifestyle modification      Status is: Inpatient  Remains inpatient appropriate because:IV treatments appropriate due to intensity of illness or inability to take PO   Dispo: The patient is from: Home               Anticipated d/c is to: Home              Anticipated d/c date is: 3 days              Patient currently is not medically stable to d/c.   Difficult to place patient No        Family Communication:  Spouse updated 1/26  Consultants:  cardiology  Code Status:  FULL   DVT Prophylaxis:   Heparin   Procedures: As Listed in Progress Note Above  Antibiotics: None       Subjective: Patient denies fevers, chills, headache, chest pain, dyspnea, nausea, vomiting, diarrhea, abdominal pain, dysuria, hematuria, hematochezia, and melena. Still unable to lay flat due to sob  Objective: Vitals:   02/10/20 2057 02/11/20 0535 02/11/20 0815 02/11/20 1430  BP: 119/76 (!) 124/92 124/88 116/85  Pulse: (!) 105 (!) 107 (!) 110 (!) 107  Resp: 17 19    Temp: 98.4 F (36.9 C) 98 F (36.7 C)    TempSrc: Oral Oral    SpO2: 99% 100% 100% 99%  Weight:  106.4 kg    Height:        Intake/Output Summary (Last 24 hours) at 02/11/2020 1657 Last data filed at 02/11/2020 1300 Gross per 24 hour  Intake 1440 ml  Output 400 ml  Net 1040 ml   Weight change: -1.738 kg Exam:   General:  Pt is alert, follows commands appropriately, not in acute distress  HEENT: No icterus, No thrush, No neck mass, [place]/AT  Cardiovascular: RRR, S1/S2, no rubs, no gallops  Respiratory: bibasilar rales. No wheeze  Abdomen: Soft/+BS, non tender,  non distended, no guarding  Extremities: 2+LE edema, No lymphangitis, No petechiae, No rashes, no synovitis   Data Reviewed: I have personally reviewed following labs and imaging studies Basic Metabolic Panel: Recent Labs  Lab 02/06/20 1851 02/07/20 0907 02/07/20 1104 02/08/20 0500 02/09/20 0549 02/10/20 0542 02/11/20 0624  NA 133* 137  --  138 137 135 137  K 3.5 3.9  --  4.4 3.4* 3.7 4.2  CL 96* 99  --  99 98 97* 97*  CO2 26 29  --  $R'29 28 30 29  'Wk$ GLUCOSE 167* 115*  --  118* 111* 133* 121*  BUN 23* 24*  --  26* $Re'19 15 17  'EXW$ CREATININE 1.11 1.00  --  1.03  0.84 0.81 0.90  CALCIUM 8.4* 8.9  --  8.8* 8.4* 8.6* 8.6*  MG 2.2  --  2.2  --   --  2.0  --   PHOS 3.4  --   --   --   --   --   --    Liver Function Tests: Recent Labs  Lab 02/06/20 1851 02/07/20 0907 02/08/20 0500  AST 50* 46* 48*  ALT 45* 43 43  ALKPHOS 119 119 120  BILITOT 1.4* 1.4* 1.3*  PROT 6.8 6.5 6.2*  ALBUMIN 3.2* 3.1* 2.9*   No results for input(s): LIPASE, AMYLASE in the last 168 hours. No results for input(s): AMMONIA in the last 168 hours. Coagulation Profile: Recent Labs  Lab 02/06/20 1851  INR 1.5*   CBC: Recent Labs  Lab 02/06/20 1851 02/07/20 0907 02/08/20 0500  WBC 9.6 9.7 10.0  NEUTROABS 7.9* 7.7 7.9*  HGB 14.0 14.2 13.9  HCT 44.0 44.3 43.9  MCV 96.3 97.4 96.5  PLT 299 288 278   Cardiac Enzymes: No results for input(s): CKTOTAL, CKMB, CKMBINDEX, TROPONINI in the last 168 hours. BNP: Invalid input(s): POCBNP CBG: No results for input(s): GLUCAP in the last 168 hours. HbA1C: No results for input(s): HGBA1C in the last 72 hours. Urine analysis: No results found for: COLORURINE, APPEARANCEUR, LABSPEC, [place], GLUCOSEU, HGBUR, BILIRUBINUR, KETONESUR, PROTEINUR, UROBILINOGEN, NITRITE, LEUKOCYTESUR Sepsis Labs: $RemoveBefo'@[email or web address]'[id number]$ (procalcitonin:4,lacticidven:4) ) Recent Results (from the past 240 hour(s))  SARS Coronavirus 2 by RT PCR (hospital order, performed in [place] hospital lab)     Status: None   Collection Time: 02/06/20  3:30 PM  Result Value Ref Range Status   SARS Coronavirus 2 NEGATIVE NEGATIVE Final    Comment: (NOTE) SARS-CoV-2 target nucleic acids are NOT DETECTED.  The SARS-CoV-2 RNA is generally detectable in upper and lower respiratory specimens during the acute phase of infection. The lowest concentration of SARS-CoV-2 viral copies this assay can detect is 250 copies / mL. A negative result does not preclude SARS-CoV-2 infection and should not be used as the sole basis for treatment or other patient management decisions.   A negative result may occur with improper specimen collection / handling, submission of specimen other than nasopharyngeal swab, presence of viral mutation(s) within the areas targeted by this assay, and inadequate number of viral copies (<250 copies / mL). A negative result must be combined with clinical observations, patient history, and epidemiological information.  Fact Sheet for Patients:   StrictlyIdeas.no  Fact Sheet for Healthcare Providers: BankingDealers.co.za  This test is not yet approved or  cleared by the [place] FDA and has been authorized for detection and/or diagnosis of SARS-CoV-2 by FDA under an Emergency Use Authorization (EUA).  This EUA will remain in effect (meaning this test can be  used) for the duration of the COVID-19 declaration under Section 564(b)(1) of the Act, 21 U.S.C. section 360bbb-3(b)(1), unless the authorization is terminated or revoked sooner.  Performed at [facility], [address], [place], [place] [address]      Scheduled Meds: . folic acid  1 mg Oral Daily  . furosemide  80 mg Intravenous BID  . heparin  5,000 Units Subcutaneous Q8H  . metoprolol succinate  25 mg Oral Daily  . multivitamin with minerals  1 tablet Oral Daily  . potassium chloride  40 mEq Oral Daily  . sacubitril-valsartan  1 tablet Oral BID  . sodium chloride flush  3 mL Intravenous Q12H  . spironolactone  12.5 mg Oral Daily  . thiamine  100 mg Oral Daily   Or  . thiamine  100 mg Intravenous Daily   Continuous Infusions: . sodium chloride      Procedures/Studies: US Abdomen Complete  Result Date: 02/07/2020 CLINICAL DATA:  Ascites, swelling of abdomen and lower extremities EXAM: ABDOMEN ULTRASOUND COMPLETE COMPARISON:  None. FINDINGS: Gallbladder: No gallstones. Mild layering gallbladder sludge. Gallbladder wall edema, measuring up to 8 mm. Negative sonographic Murphy's sign. Common bile duct: Diameter: 4 mm  Liver: Hyperechoic hepatic parenchyma. Mildly nodular hepatic contour. No focal hepatic lesion is seen. Portal vein is patent on color Doppler imaging with normal direction of blood flow towards the liver. IVC: No abnormality visualized. Pancreas: Poorly visualized due to overlying bowel gas. Spleen: Size and appearance within normal limits. Right Kidney: Length: 11.9 cm. Echogenicity within normal limits. No mass or hydronephrosis visualized. Left Kidney: Length: 11.1 cm. Echogenicity within normal limits. No mass or hydronephrosis visualized. Abdominal aorta: No aneurysm visualized. Other findings: Small volume perihepatic and perisplenic ascites. IMPRESSION: Gallbladder wall edema, without associated sonographic findings to suggest acute cholecystitis. This appearance is likely secondary to right upper quadrant inflammation or hypoalbuminemia. Mild upper abdominal ascites. Hyperechoic hepatic parenchyma with a mildly nodular hepatic contour, raising the possibility of mild cirrhosis and/or hepatic steatosis. Electronically Signed   By: Charline Bills M.D.   On: 02/07/2020 12:02   DG Chest Port 1 View  Result Date: 02/07/2020 CLINICAL DATA:  Asthma, dyspnea EXAM: PORTABLE CHEST 1 VIEW COMPARISON:  None. FINDINGS: Lungs are clear. No pneumothorax or pleural effusion. Moderate cardiomegaly is present. Pulmonary vascularity is normal. No acute bone abnormality. IMPRESSION: Moderate cardiomegaly. Electronically Signed   By: Helyn Numbers MD   On: 02/07/2020 05:28   ECHOCARDIOGRAM COMPLETE  Result Date: 02/07/2020    ECHOCARDIOGRAM REPORT   Patient Name:   ORVAL DORTCH Date of Exam: 02/07/2020 Medical Rec #:  [id number]          Height:       68.0 in Accession #:    [id number]         Weight:       241.2 lb Date of Birth:  10-18-1970           BSA:          2.213 m Patient Age:    49 years           BP:           133/91 mmHg Patient Gender: M                  HR:           103 bpm. [facility] Procedure: 2D Echo, Cardiac Doppler and Color Doppler Indications:    Dyspnea  R06.00  History:        Patient has no prior history of Echocardiogram examinations.                 CHF. 3+ pitting edema.  Sonographer:    Celesta Gentile RCS Referring Phys: [phone] [place] IMPRESSIONS  1. Left ventricular ejection fraction, by estimation, is 20 to 25%. The left ventricle has severely decreased function. The left ventricle demonstrates global hypokinesis. The left ventricular internal cavity size was moderately to severely dilated. Left ventricular diastolic parameters are consistent with Grade III diastolic dysfunction (restrictive).  2. Right ventricular systolic function is moderately reduced. The right ventricular size is moderately enlarged. There is moderately elevated pulmonary artery systolic pressure.  3. Left atrial size was moderately dilated.  4. Right atrial size was mildly dilated.  5. Large pericardial effusion.  6. The mitral valve is normal in structure. Moderate mitral valve regurgitation.  7. Tricuspid valve regurgitation is moderate.  8. The aortic valve is tricuspid. Aortic valve regurgitation is not visualized.  9. The inferior vena cava is dilated in size with <50% respiratory variability, suggesting right atrial pressure of 15 mmHg. There is no evidence of mitral or tricuspid valve peak veloxity inflow variation, studies assumes respiratory maneuvers are performed. 10. New Biventricular dsyfunction with large, circumferential pericardial effusion. Reaching out to primary team. Comparison(s): No prior Echocardiogram. FINDINGS  Left Ventricle: DP/Dt diminised at 821 mm Hg/s. Left ventricular ejection fraction, by estimation, is 20 to 25%. The left ventricle has severely decreased function. The left ventricle demonstrates global hypokinesis. The left ventricular internal cavity  size was moderately to severely dilated. There is no left ventricular hypertrophy. Left ventricular diastolic  parameters are consistent with Grade III diastolic dysfunction (restrictive). Right Ventricle: The right ventricular size is moderately enlarged. No increase in right ventricular wall thickness. Right ventricular systolic function is moderately reduced. There is moderately elevated pulmonary artery systolic pressure. The tricuspid  regurgitant velocity is 2.99 m/s, and with an assumed right atrial pressure of 15 mmHg, the estimated right ventricular systolic pressure is 50.8 mmHg. Left Atrium: Left atrial size was moderately dilated. Right Atrium: Right atrial size was mildly dilated. Pericardium: A large pericardial effusion is present. Mitral Valve: The mitral valve is normal in structure. Moderate mitral valve regurgitation. Tricuspid Valve: The tricuspid valve is normal in structure. Tricuspid valve regurgitation is moderate. Aortic Valve: The aortic valve is tricuspid. Aortic valve regurgitation is not visualized. Pulmonic Valve: The pulmonic valve was not well visualized. Pulmonic valve regurgitation is trivial. Aorta: The aortic root is normal in size and structure and the ascending aorta was not well visualized. Venous: The pulmonary veins were not well visualized. The inferior vena cava is dilated in size with less than 50% respiratory variability, suggesting right atrial pressure of 15 mmHg. IAS/Shunts: No atrial level shunt detected by color flow Doppler.  LEFT VENTRICLE PLAX 2D LVIDd:         6.60 cm LVIDs:         5.90 cm LV PW:         1.10 cm LV IVS:        0.80 cm LVOT diam:     2.00 cm LV SV:         29 LV SV Index:   13 LVOT Area:     3.14 cm  LV Volumes (MOD) LV vol d, MOD A2C: 194.0 ml LV vol d, MOD A4C: 182.0 ml LV vol s, MOD A2C: 167.0  ml LV vol s, MOD A4C: 134.0 ml LV SV MOD A2C:     27.0 ml LV SV MOD A4C:     182.0 ml LV SV MOD BP:      42.2 ml RIGHT VENTRICLE RV S prime:     8.03 cm/s TAPSE (M-mode): 2.0 cm LEFT ATRIUM              Index       RIGHT ATRIUM           Index LA diam:         5.10 cm  2.30 cm/m  RA Area:     25.40 cm LA Vol (A2C):   94.1 ml  42.52 ml/m RA Volume:   82.10 ml  37.10 ml/m LA Vol (A4C):   96.2 ml  43.47 ml/m LA Biplane Vol: 104.0 ml 46.99 ml/m  AORTIC VALVE LVOT Vmax:   65.90 cm/s LVOT Vmean:  37.100 cm/s LVOT VTI:    0.094 m  AORTA Ao Root diam: 3.60 cm MITRAL VALVE                 TRICUSPID VALVE MV Area (PHT): 8.92 cm      TR Peak grad:   35.8 mmHg MV Decel Time: 85 msec       TR Vmax:        299.00 cm/s MR Peak grad:    63.7 mmHg MR Mean grad:    39.0 mmHg   SHUNTS MR Vmax:         399.00 cm/s Systemic VTI:  0.09 m MR Vmean:        292.0 cm/s  Systemic Diam: 2.00 cm MR PISA:         2.26 cm MR PISA Eff ROA: 14 mm MR PISA Radius:  0.60 cm MV E velocity: 1.05 cm/s MV A velocity: 24.40 cm/s MV E/A ratio:  0.04 Rudean Haskell MD Electronically signed by Rudean Haskell MD Signature Date/Time: 02/07/2020/12:44:43 PM    Final    ECHOCARDIOGRAM LIMITED  Result Date: 02/09/2020    ECHOCARDIOGRAM LIMITED REPORT   Patient Name:   CHAD DONOGHUE Date of Exam: 02/09/2020 Medical Rec #:  [id number]          Height:       68.0 in Accession #:    [id number]         Weight:       242.5 lb Date of Birth:  06-13-1970           BSA:          2.218 m Patient Age:    47 years           BP:           118/85 mmHg Patient Gender: M                  HR:           105 bpm. [facility] Procedure: Limited Echo Indications:    Pericardial Effusion  History:        Patient has prior history of Echocardiogram examinations, most                 recent 02/07/2020. CHF; Risk Factors:Non-Smoker.  Sonographer:    Leavy Cella RDCS (AE) Referring Phys: [place]  1. Left ventricular ejection fraction, by estimation, is 20 to 25%. The left ventricle has severely decreased function. The left ventricle demonstrates global hypokinesis.  2. Moderate to large pericardial effusion that is circumferential. Compared to 02/07/20 study there is some  decrease in size of the effusion. No evidence of tamponade physiology by echo. . There is no evidence of cardiac tamponade.  3. The inferior vena cava is normal in size with greater than 50% respiratory variability, suggesting right atrial pressure of 3 mmHg.  4. Limited echo to evaluate pericardial effusion FINDINGS  Left Ventricle: Left ventricular ejection fraction, by estimation, is 20 to 25%. The left ventricle has severely decreased function. The left ventricle demonstrates global hypokinesis. Pericardium: Moderate to large pericardial effusion that is circumferential. Compared to 02/07/20 study there is some decrease in size of the effusion. No evidence of tamponade physiology by echo. There is no evidence of cardiac tamponade. Venous: The inferior vena cava is normal in size with greater than 50% respiratory variability, suggesting right atrial pressure of 3 mmHg. LEFT VENTRICLE PLAX 2D LVIDd:         6.27 cm  Diastology LVIDs:         5.90 cm  LV e' medial:    5.70 cm/s LV PW:         1.41 cm  LV E/e' medial:  14.0 LV IVS:        1.11 cm  LV e' lateral:   9.15 cm/s LVOT diam:     2.10 cm  LV E/e' lateral: 8.7 LVOT Area:     3.46 cm  LEFT ATRIUM         Index LA diam:    3.40 cm 1.53 cm/m   AORTA Ao Root diam: 3.20 cm MITRAL VALVE               TRICUSPID VALVE MV Area (PHT): 4.31 cm    TR Peak grad:   23.2 mmHg MV Decel Time: 176 msec    TR Vmax:        241.00 cm/s MR Peak grad: 63.7 mmHg MR Mean grad: 37.0 mmHg    SHUNTS MR Vmax:      399.00 cm/s  Systemic Diam: 2.10 cm MR Vmean:     283.0 cm/s MV E velocity: 79.90 cm/s MV A velocity: 35.00 cm/s MV E/A ratio:  2.28 Carlyle Dolly MD Electronically signed by Carlyle Dolly MD Signature Date/Time: 02/09/2020/4:02:26 PM    Final     Orson Eva, DO  [facility]  If 7PM-7AM, please contact night-coverage [email or web address] Password [facility] 02/11/2020, 4:57 PM   LOS: 5 days

## 2020-02-11 NOTE — Progress Notes (Signed)
Progress Note  Patient Name: Craig Hanson Date of Encounter: 02/11/2020  [facility] Cardiologist: Rozann Lesches, MD   Subjective   No complaints  Inpatient Medications    Scheduled Meds: . folic acid  1 mg Oral Daily  . furosemide  80 mg Intravenous BID  . heparin  5,000 Units Subcutaneous Q8H  . metoprolol succinate  25 mg Oral Daily  . multivitamin with minerals  1 tablet Oral Daily  . potassium chloride  40 mEq Oral Daily  . sacubitril-valsartan  1 tablet Oral BID  . sodium chloride flush  3 mL Intravenous Q12H  . spironolactone  12.5 mg Oral Daily  . thiamine  100 mg Oral Daily   Or  . thiamine  100 mg Intravenous Daily   Continuous Infusions: . sodium chloride     PRN Meds: sodium chloride, acetaminophen, ondansetron (ZOFRAN) IV, sodium chloride flush   Vital Signs    Vitals:   02/10/20 1348 02/10/20 2057 02/11/20 0535 02/11/20 0815  BP: 112/83 119/76 (!) 124/92 124/88  Pulse: (!) 106 (!) 105 (!) 107 (!) 110  Resp: $Remo'18 17 19   'vdtWX$ Temp: (!) 97.4 F (36.3 C) 98.4 F (36.9 C) 98 F (36.7 C)   TempSrc: Oral Oral Oral   SpO2: 100% 99% 100% 100%  Weight:   106.4 kg   Height:        Intake/Output Summary (Last 24 hours) at 02/11/2020 0853 Last data filed at 02/10/2020 1800 Gross per 24 hour  Intake 1080 ml  Output 1300 ml  Net -220 ml   Last 3 Weights 02/11/2020 02/10/2020 02/09/2020  Weight (lbs) 234 lb 9.1 oz 238 lb 6.4 oz 242 lb 8.1 oz  Weight (kg) 106.4 kg 108.138 kg 110 kg      Telemetry    Sinus tach - Personally Reviewed  ECG    n/a - Personally Reviewed  Physical Exam   GEN: No acute distress.   Neck: No JVD Cardiac: Regular, mild tachy Respiratory: crackles bilateral bases GI: Soft, nontender, non-distended  MS: 2+ bilateral LE edema Neuro:  Nonfocal  Psych: Normal affect   Labs    High Sensitivity Troponin:  No results for input(s): TROPONINIHS in the last 720 hours.    Chemistry Recent Labs  Lab 02/06/20 1851  02/07/20 0907 02/08/20 0500 02/09/20 0549 02/10/20 0542 02/11/20 0624  NA 133* 137 138 137 135 137  K 3.5 3.9 4.4 3.4* 3.7 4.2  CL 96* 99 99 98 97* 97*  CO2 $Re'26 29 29 28 30 29  'rTP$ GLUCOSE 167* 115* 118* 111* 133* 121*  BUN 23* 24* 26* $Remov'19 15 17  'FbVQez$ CREATININE 1.11 1.00 1.03 0.84 0.81 0.90  CALCIUM 8.4* 8.9 8.8* 8.4* 8.6* 8.6*  PROT 6.8 6.5 6.2*  --   --   --   ALBUMIN 3.2* 3.1* 2.9*  --   --   --   AST 50* 46* 48*  --   --   --   ALT 45* 43 43  --   --   --   ALKPHOS 119 119 120  --   --   --   BILITOT 1.4* 1.4* 1.3*  --   --   --   GFRNONAA >60 >60 >60 >60 >60 >60  ANIONGAP $RemoveB'11 9 10 11 8 11     'lbQpDsKn$ Hematology Recent Labs  Lab 02/06/20 1851 02/07/20 0907 02/08/20 0500  WBC 9.6 9.7 10.0  RBC 4.57 4.55 4.55  HGB 14.0 14.2 13.9  HCT  44.0 44.3 43.9  MCV 96.3 97.4 96.5  MCH 30.6 31.2 30.5  MCHC 31.8 32.1 31.7  RDW 16.4* 16.6* 16.7*  PLT 299 288 278    BNP Recent Labs  Lab 02/06/20 1851  BNP 2,881.0*     DDimer No results for input(s): DDIMER in the last 168 hours.   Radiology    ECHOCARDIOGRAM LIMITED  Result Date: 02/09/2020    ECHOCARDIOGRAM LIMITED REPORT   Patient Name:   DUEY LILLER Date of Exam: 02/09/2020 Medical Rec #:  [id number]          Height:       68.0 in Accession #:    [id number]         Weight:       242.5 lb Date of Birth:  13-Feb-1970           BSA:          2.218 m Patient Age:    50 years           BP:           118/85 mmHg Patient Gender: M                  HR:           105 bpm. Exam Location:  [facility] Procedure: Limited Echo Indications:    Pericardial Effusion  History:        Patient has prior history of Echocardiogram examinations, most                 recent 02/07/2020. CHF; Risk Factors:Non-Smoker.  Sonographer:    Leavy Cella RDCS (AE) Referring Phys: [place]  1. Left ventricular ejection fraction, by estimation, is 20 to 25%. The left ventricle has severely decreased function. The left ventricle demonstrates global  hypokinesis.  2. Moderate to large pericardial effusion that is circumferential. Compared to 02/07/20 study there is some decrease in size of the effusion. No evidence of tamponade physiology by echo. . There is no evidence of cardiac tamponade.  3. The inferior vena cava is normal in size with greater than 50% respiratory variability, suggesting right atrial pressure of 3 mmHg.  4. Limited echo to evaluate pericardial effusion FINDINGS  Left Ventricle: Left ventricular ejection fraction, by estimation, is 20 to 25%. The left ventricle has severely decreased function. The left ventricle demonstrates global hypokinesis. Pericardium: Moderate to large pericardial effusion that is circumferential. Compared to 02/07/20 study there is some decrease in size of the effusion. No evidence of tamponade physiology by echo. There is no evidence of cardiac tamponade. Venous: The inferior vena cava is normal in size with greater than 50% respiratory variability, suggesting right atrial pressure of 3 mmHg. LEFT VENTRICLE PLAX 2D LVIDd:         6.27 cm  Diastology LVIDs:         5.90 cm  LV e' medial:    5.70 cm/s LV PW:         1.41 cm  LV E/e' medial:  14.0 LV IVS:        1.11 cm  LV e' lateral:   9.15 cm/s LVOT diam:     2.10 cm  LV E/e' lateral: 8.7 LVOT Area:     3.46 cm  LEFT ATRIUM         Index LA diam:    3.40 cm 1.53 cm/m   AORTA Ao Root diam: 3.20 cm MITRAL VALVE  TRICUSPID VALVE MV Area (PHT): 4.31 cm    TR Peak grad:   23.2 mmHg MV Decel Time: 176 msec    TR Vmax:        241.00 cm/s MR Peak grad: 63.7 mmHg MR Mean grad: 37.0 mmHg    SHUNTS MR Vmax:      399.00 cm/s  Systemic Diam: 2.10 cm MR Vmean:     283.0 cm/s MV E velocity: 79.90 cm/s MV A velocity: 35.00 cm/s MV E/A ratio:  2.28 Carlyle Dolly MD Electronically signed by Carlyle Dolly MD Signature Date/Time: 02/09/2020/4:02:26 PM    Final     Cardiac Studies    Patient Profile     Rockford Leinen Hairstonis a 50 y.o.malewith a hx of EtOH  abusewho is being seen today for the evaluation of SOBat the request of Dr Roderic Palau.  Assessment & Plan    1. Acute biventricular heart failure -incomplete I/Os yesterday, neg at least  6.6 L since admission. Weights would indicate down additional 4 lbs sicne yesterday. He is on IV lasix $Remove'80mg'uLfvmwx$  bid Renal function is stable. medical therapy with entresto 24/$RemoveBeforeDE'26mg'xUrhhEpuWMRNzzk$  bid, toprol $RemoveBef'25mg'wSYHnkQLeS$  aldactone 12.5  - worrisome presentation for very low CO in setting of severe LV dysfunction by echo and persistent sinus tachycardia. He is diuresing well with just IV lasix with stable renal function however. Careful titration of beta blocker, f/u CI from upcoming cath.  - Will need RHC/LHC this admission given degree of dysfunction once he is euvolemic.  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 Symptoms started after his Nov COVID vaccination though I think this is coincidental, LAVI of 44 would suggest fairly long standing elevated filling pressures.   - continue IV diuresis today, remains volume overloaded - cath once more euvolemic, possibly Friday   2. Pericardial effusion - large by initial echo, repeat echo shows improving though still moderate to large. No tamponade findings - ESR 42, CRP pending, TSH 3.3 - perhaps inflammatory/post infectious - appears to be improving by serial imaging.   For questions or updates, please contact [place] Please consult [email or web address] for contact info under        Signed, Carlyle Dolly, MD  02/11/2020, 8:53 AM

## 2020-02-12 ENCOUNTER — Ambulatory Visit: Payer: Managed Care, Other (non HMO) | Admitting: Nurse Practitioner

## 2020-02-12 DIAGNOSIS — I5021 Acute systolic (congestive) heart failure: Secondary | ICD-10-CM | POA: Diagnosis not present

## 2020-02-12 DIAGNOSIS — E669 Obesity, unspecified: Secondary | ICD-10-CM | POA: Diagnosis not present

## 2020-02-12 LAB — BASIC METABOLIC PANEL
Anion gap: 8 (ref 5–15)
BUN: 20 mg/dL (ref 6–20)
CO2: 29 mmol/L (ref 22–32)
Calcium: 8.9 mg/dL (ref 8.9–10.3)
Chloride: 99 mmol/L (ref 98–111)
Creatinine, Ser: 0.96 mg/dL (ref 0.61–1.24)
GFR, Estimated: >60 mL/min (ref >60)
Glucose, Bld: 113 mg/dL — ABNORMAL HIGH (ref 70–99)
Potassium: 4.1 mmol/L (ref 3.5–5.1)
Sodium: 136 mmol/L (ref 135–145)

## 2020-02-12 LAB — MAGNESIUM: Magnesium: 2.1 mg/dL (ref 1.7–2.4)

## 2020-02-12 NOTE — Progress Notes (Addendum)
 Progress Note  Patient Name: Craig Hanson Date of Encounter: 02/12/2020  [facility] Cardiologist: Samuel McDowell, MD   Subjective   Denies any SOB  Inpatient Medications    Scheduled Meds: . folic acid  1 mg Oral Daily  . furosemide  80 mg Intravenous BID  . heparin  5,000 Units Subcutaneous Q8H  . metoprolol succinate  25 mg Oral Daily  . multivitamin with minerals  1 tablet Oral Daily  . potassium chloride  40 mEq Oral Daily  . sacubitril-valsartan  1 tablet Oral BID  . sodium chloride flush  3 mL Intravenous Q12H  . spironolactone  12.5 mg Oral Daily  . thiamine  100 mg Oral Daily   Or  . thiamine  100 mg Intravenous Daily   Continuous Infusions: . sodium chloride     PRN Meds: sodium chloride, acetaminophen, ondansetron (ZOFRAN) IV, sodium chloride flush   Vital Signs    Vitals:   02/11/20 0815 02/11/20 1430 02/11/20 2133 02/12/20 0545  BP: 124/88 116/85 (!) 118/96 (!) 124/99  Pulse: (!) 110 (!) 107 98 (!) 109  Resp:   19 18  Temp:   97.8 F (36.6 C) 97.8 F (36.6 C)  TempSrc:      SpO2: 100% 99% 100% 100%  Weight:    105.4 kg  Height:        Intake/Output Summary (Last 24 hours) at 02/12/2020 0906 Last data filed at 02/12/2020 0500 Gross per 24 hour  Intake 1200 ml  Output 1100 ml  Net 100 ml   Last 3 Weights 02/12/2020 02/11/2020 02/10/2020  Weight (lbs) 232 lb 5.8 oz 234 lb 9.1 oz 238 lb 6.4 oz  Weight (kg) 105.4 kg 106.4 kg 108.138 kg      Telemetry    Sinus tach - Personally Reviewed  ECG    n/a - Personally Reviewed  Physical Exam   GEN: No acute distress.   Neck: elevated JVD Cardiac: RRR, no murmurs, rubs, or gallops.  Respiratory: Clear to auscultation bilaterally. GI: Soft, nontender, non-distended  MS: 2+ bilateral LE edema; No deformity. Neuro:  Nonfocal  Psych: Normal affect   Labs    High Sensitivity Troponin:  No results for input(s): TROPONINIHS in the last 720 hours.    Chemistry Recent Labs  Lab  02/06/20 1851 02/07/20 0907 02/08/20 0500 02/09/20 0549 02/10/20 0542 02/11/20 0624 02/12/20 0606  NA 133* 137 138   < > 135 137 136  K 3.5 3.9 4.4   < > 3.7 4.2 4.1  CL 96* 99 99   < > 97* 97* 99  CO2 26 29 29   < > 30 29 29  GLUCOSE 167* 115* 118*   < > 133* 121* 113*  BUN 23* 24* 26*   < > 15 17 20  CREATININE 1.11 1.00 1.03   < > 0.81 0.90 0.96  CALCIUM 8.4* 8.9 8.8*   < > 8.6* 8.6* 8.9  PROT 6.8 6.5 6.2*  --   --   --   --   ALBUMIN 3.2* 3.1* 2.9*  --   --   --   --   AST 50* 46* 48*  --   --   --   --   ALT 45* 43 43  --   --   --   --   ALKPHOS 119 119 120  --   --   --   --   BILITOT 1.4* 1.4* 1.3*  --   --   --   --     GFRNONAA >60 >60 >60   < > >60 >60 >60  ANIONGAP 11 9 10   < > 8 11 8   < > = values in this interval not displayed.     Hematology Recent Labs  Lab 02/06/20 1851 02/07/20 0907 02/08/20 0500  WBC 9.6 9.7 10.0  RBC 4.57 4.55 4.55  HGB 14.0 14.2 13.9  HCT 44.0 44.3 43.9  MCV 96.3 97.4 96.5  MCH 30.6 31.2 30.5  MCHC 31.8 32.1 31.7  RDW 16.4* 16.6* 16.7*  PLT 299 288 278    BNP Recent Labs  Lab 02/06/20 1851  BNP 2,881.0*     DDimer No results for input(s): DDIMER in the last 168 hours.   Radiology    No results found.  Cardiac Studies    Patient Profile     Craig Hansonis a 49 y.o.malewith a hx of EtOH abusewho is being seen today for the evaluation of SOBat the request of Dr Memon.  Assessment & Plan    1. Acute biventricular heart failure -incomplete I/Os yesterday, neg at least  6.6L since admission. Weights would indicate down additional 2 lbs sicne yesterday. He is on IV lasix 80mg bid Renal function remains stable. medical therapy with entresto 24/26mg bid, toprol 25mgaldactone 12.5  - worrisome presentation for very low CO in setting of severe LV dysfunction by echo and persistent sinus tachycardia. He is diuresing well with just IV lasix with stable renal function however. Careful titration of beta  blocker, f/u CI from upcoming cath.  -Will need RHC/LHC this admission given degree of dysfunction once he is euvolemic.  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 Symptoms started after his Nov COVID vaccination though I think this is coincidental, LAVI of 44 would suggest fairly long standing elevated filling pressures.  - ongoing fluid overload, at a point where could pursue cath. Plan for RHC/LHC tomorrow - continue IV diuresis.    I have reviewed the risks, indications, and alternatives to cardiac catheterization, possible angioplasty, and stenting with the patient today. Risks include but are not limited to bleeding, infection, vascular injury, stroke, myocardial infection, arrhythmia, kidney injury, radiation-related injury in the case of prolonged fluoroscopy use, emergency cardiac surgery, and death. The patient understands the risks of serious complication is 1-2 in 1000 with diagnostic cardiac cath and 1-2% or less with angioplasty/stenting.   2. Pericardial effusion - large by initial echo, repeat echo shows improving though still moderate to large. No tamponade findings -ESR 42, CRP pending, TSH 3.3 - perhaps inflammatory/post infectious - appears to be improving by serial imaging.   Plan to transfer to [place] today, LHC/RHC tomorrow.  For questions or updates, please contact [facility] Please consult [email or web address] for contact info under        Signed, Vergil Burby, MD  02/12/2020, 9:06 AM    

## 2020-02-12 NOTE — Progress Notes (Signed)
PROGRESS NOTE  Craig Hanson SJG:[id number] DOB: 1970-05-22 DOA: 02/06/2020 PCP: Noreene Larsson, NP  Brief History:  49 y.o.male,past medical history of asthma childhood, alcohol abuse, patient Care with PCP in December 2021, who referred him for cardiology evaluation, patient reports shortness of breath, orthopnea, progressive over last 10-month, as well shortness of breath, mainly orthopnea, currently exertional as well, progressive as well, he was sent to cardiology for further evaluation, he was prescribed Lasix, despite that he continues to have worsening edema, patient reports history of alcohol abuse, used to drink up to 12 beers in a day, reports he cut back on that, he is only drinking hard liquor now, vodka, 3-4 shots about 3 nights a week, report he is cutting back on that, denies fever, chills, chest pain. -In cardiology office patient was noted to have significant edema in lower extremity, scrotal edema, abdominal wall edema, so direct admission was requested for further work-upand diuresis   Assessment/Plan: Acute systolic/Biventricular CHF -remains clinically fluid overloaded -continue IV lasix 80 mg q 12 -I/Os incomplete, but appears neg 6.6L -01/2420 Echo  EF 20-25%, global HK, moderate to large pericardial effusion without tamponade -continue entresto 24/28 and metoprolol succinate -continue spironolactone -RHC/LHC planned for 1/28 -appreciate cardiology follow up  Pericardial effusion -02/09/20 limited echo--slightly smaller, no tamponade -ESR 42, CRP 69.9 -TSH 3.364  alcohol abuse -Currently on CIWA protocol -No signs of withdrawal at this time -Patient says that he is motivated to stop drinking  Morbid Obesity -BMI 35.67 -lifestyle modification      Status is: Inpatient  Remains inpatient appropriate because:IV treatments appropriate due to intensity of illness or inability to take PO   Dispo: The patient is from:  Home  Anticipated d/c is to: Home  Anticipated d/c date is: 3 days  Patient currently is not medically stable to d/c.              Difficult to place patient No        Family Communication:  Spouse updated 1/27  Consultants:  cardiology  Code Status:  FULL   DVT Prophylaxis:  [place] Heparin   Procedures: As Listed in Progress Note Above  Antibiotics: None        Subjective: Patient denies fevers, chills, headache, chest pain, dyspnea, nausea, vomiting, diarrhea, abdominal pain, dysuria, hematuria, hematochezia, and melena.   Objective: Vitals:   02/11/20 0815 02/11/20 1430 02/11/20 2133 02/12/20 0545  BP: 124/88 116/85 (!) 118/96 (!) 124/99  Pulse: (!) 110 (!) 107 98 (!) 109  Resp:   19 18  Temp:   97.8 F (36.6 C) 97.8 F (36.6 C)  TempSrc:      SpO2: 100% 99% 100% 100%  Weight:    105.4 kg  Height:        Intake/Output Summary (Last 24 hours) at 02/12/2020 1423 Last data filed at 02/12/2020 6546 Gross per 24 hour  Intake 480 ml  Output 1100 ml  Net -620 ml   Weight change: -1 kg Exam:   General:  Pt is alert, follows commands appropriately, not in acute distress  HEENT: No icterus, No thrush, No neck mass, [place]/AT  Cardiovascular: RRR, S1/S2, no rubs, no gallops  Respiratory: bibasilar crackles. No wheeze  Abdomen: Soft/+BS, non tender, non distended, no guarding  Extremities: 2+ LE edema, No lymphangitis, No petechiae, No rashes, no synovitis   Data Reviewed: I have personally reviewed following labs and imaging studies Basic Metabolic Panel:  Recent Labs  Lab 02/06/20 1851 02/07/20 0907 02/07/20 1104 02/08/20 0500 02/09/20 0549 02/10/20 0542 02/11/20 0624 02/12/20 0606  NA 133*   < >  --  138 137 135 137 136  K 3.5   < >  --  4.4 3.4* 3.7 4.2 4.1  CL 96*   < >  --  99 98 97* 97* 99  CO2 26   < >  --  $R'29 28 30 29 29  'mP$ GLUCOSE 167*   < >  --  118* 111* 133* 121* 113*  BUN 23*   <  >  --  26* $Re'19 15 17 20  'pyA$ CREATININE 1.11   < >  --  1.03 0.84 0.81 0.90 0.96  CALCIUM 8.4*   < >  --  8.8* 8.4* 8.6* 8.6* 8.9  MG 2.2  --  2.2  --   --  2.0  --  2.1  PHOS 3.4  --   --   --   --   --   --   --    < > = values in this interval not displayed.   Liver Function Tests: Recent Labs  Lab 02/06/20 1851 02/07/20 0907 02/08/20 0500  AST 50* 46* 48*  ALT 45* 43 43  ALKPHOS 119 119 120  BILITOT 1.4* 1.4* 1.3*  PROT 6.8 6.5 6.2*  ALBUMIN 3.2* 3.1* 2.9*   No results for input(s): LIPASE, AMYLASE in the last 168 hours. No results for input(s): AMMONIA in the last 168 hours. Coagulation Profile: Recent Labs  Lab 02/06/20 1851  INR 1.5*   CBC: Recent Labs  Lab 02/06/20 1851 02/07/20 0907 02/08/20 0500  WBC 9.6 9.7 10.0  NEUTROABS 7.9* 7.7 7.9*  HGB 14.0 14.2 13.9  HCT 44.0 44.3 43.9  MCV 96.3 97.4 96.5  PLT 299 288 278   Cardiac Enzymes: No results for input(s): CKTOTAL, CKMB, CKMBINDEX, TROPONINI in the last 168 hours. BNP: Invalid input(s): POCBNP CBG: No results for input(s): GLUCAP in the last 168 hours. HbA1C: No results for input(s): HGBA1C in the last 72 hours. Urine analysis: No results found for: COLORURINE, APPEARANCEUR, LABSPEC, [place], GLUCOSEU, HGBUR, BILIRUBINUR, KETONESUR, PROTEINUR, UROBILINOGEN, NITRITE, LEUKOCYTESUR Sepsis Labs: $RemoveBefo'@[email or web address]'[id number]$ (procalcitonin:4,lacticidven:4) ) Recent Results (from the past 240 hour(s))  SARS Coronavirus 2 by RT PCR (hospital order, performed in [place] hospital lab)     Status: None   Collection Time: 02/06/20  3:30 PM  Result Value Ref Range Status   SARS Coronavirus 2 NEGATIVE NEGATIVE Final    Comment: (NOTE) SARS-CoV-2 target nucleic acids are NOT DETECTED.  The SARS-CoV-2 RNA is generally detectable in upper and lower respiratory specimens during the acute phase of infection. The lowest concentration of SARS-CoV-2 viral copies this assay can detect is 250 copies / mL. A negative result does not  preclude SARS-CoV-2 infection and should not be used as the sole basis for treatment or other patient management decisions.  A negative result may occur with improper specimen collection / handling, submission of specimen other than nasopharyngeal swab, presence of viral mutation(s) within the areas targeted by this assay, and inadequate number of viral copies (<250 copies / mL). A negative result must be combined with clinical observations, patient history, and epidemiological information.  Fact Sheet for Patients:   StrictlyIdeas.no  Fact Sheet for Healthcare Providers: BankingDealers.co.za  This test is not yet approved or  cleared by the [place] FDA and has been authorized for detection and/or diagnosis of SARS-CoV-2 by FDA under  an Emergency Use Authorization (EUA).  This EUA will remain in effect (meaning this test can be used) for the duration of the COVID-19 declaration under Section 564(b)(1) of the Act, 21 U.S.C. section 360bbb-3(b)(1), unless the authorization is terminated or revoked sooner.  Performed at [facility], [address] [place], [place] [address]      Scheduled Meds: . folic acid  1 mg Oral Daily  . furosemide  80 mg Intravenous BID  . heparin  5,000 Units Subcutaneous Q8H  . metoprolol succinate  25 mg Oral Daily  . multivitamin with minerals  1 tablet Oral Daily  . potassium chloride  40 mEq Oral Daily  . sacubitril-valsartan  1 tablet Oral BID  . sodium chloride flush  3 mL Intravenous Q12H  . spironolactone  12.5 mg Oral Daily  . thiamine  100 mg Oral Daily   Or  . thiamine  100 mg Intravenous Daily   Continuous Infusions: . sodium chloride      Procedures/Studies: US Abdomen Complete  Result Date: 02/07/2020 CLINICAL DATA:  Ascites, swelling of abdomen and lower extremities EXAM: ABDOMEN ULTRASOUND COMPLETE COMPARISON:  None. FINDINGS: Gallbladder: No gallstones. Mild layering  gallbladder sludge. Gallbladder wall edema, measuring up to 8 mm. Negative sonographic Murphy's sign. Common bile duct: Diameter: 4 mm Liver: Hyperechoic hepatic parenchyma. Mildly nodular hepatic contour. No focal hepatic lesion is seen. Portal vein is patent on color Doppler imaging with normal direction of blood flow towards the liver. IVC: No abnormality visualized. Pancreas: Poorly visualized due to overlying bowel gas. Spleen: Size and appearance within normal limits. Right Kidney: Length: 11.9 cm. Echogenicity within normal limits. No mass or hydronephrosis visualized. Left Kidney: Length: 11.1 cm. Echogenicity within normal limits. No mass or hydronephrosis visualized. Abdominal aorta: No aneurysm visualized. Other findings: Small volume perihepatic and perisplenic ascites. IMPRESSION: Gallbladder wall edema, without associated sonographic findings to suggest acute cholecystitis. This appearance is likely secondary to right upper quadrant inflammation or hypoalbuminemia. Mild upper abdominal ascites. Hyperechoic hepatic parenchyma with a mildly nodular hepatic contour, raising the possibility of mild cirrhosis and/or hepatic steatosis. Electronically Signed   By: Julian Hy M.D.   On: 02/07/2020 12:02   DG Chest Port 1 View  Result Date: 02/07/2020 CLINICAL DATA:  Asthma, dyspnea EXAM: PORTABLE CHEST 1 VIEW COMPARISON:  None. FINDINGS: Lungs are clear. No pneumothorax or pleural effusion. Moderate cardiomegaly is present. Pulmonary vascularity is normal. No acute bone abnormality. IMPRESSION: Moderate cardiomegaly. Electronically Signed   By: Fidela Salisbury MD   On: 02/07/2020 05:28   ECHOCARDIOGRAM COMPLETE  Result Date: 02/07/2020    ECHOCARDIOGRAM REPORT   Patient Name:   Craig Hanson Date of Exam: 02/07/2020 Medical Rec #:  [id number]          Height:       68.0 in Accession #:    [id number]         Weight:       241.2 lb Date of Birth:  03-08-1970           BSA:          2.213 m  Patient Age:    50 years           BP:           133/91 mmHg Patient Gender: M                  HR:           103 bpm. Exam  Location:  [facility] Procedure: 2D Echo, Cardiac Doppler and Color Doppler Indications:    Dyspnea R06.00  History:        Patient has no prior history of Echocardiogram examinations.                 CHF. 3+ pitting edema.  Sonographer:    Alvino Chapel RCS Referring Phys: [place]  1. Left ventricular ejection fraction, by estimation, is 20 to 25%. The left ventricle has severely decreased function. The left ventricle demonstrates global hypokinesis. The left ventricular internal cavity size was moderately to severely dilated. Left ventricular diastolic parameters are consistent with Grade III diastolic dysfunction (restrictive).  2. Right ventricular systolic function is moderately reduced. The right ventricular size is moderately enlarged. There is moderately elevated pulmonary artery systolic pressure.  3. Left atrial size was moderately dilated.  4. Right atrial size was mildly dilated.  5. Large pericardial effusion.  6. The mitral valve is normal in structure. Moderate mitral valve regurgitation.  7. Tricuspid valve regurgitation is moderate.  8. The aortic valve is tricuspid. Aortic valve regurgitation is not visualized.  9. The inferior vena cava is dilated in size with <50% respiratory variability, suggesting right atrial pressure of 15 mmHg. There is no evidence of mitral or tricuspid valve peak veloxity inflow variation, studies assumes respiratory maneuvers are performed. 10. New Biventricular dsyfunction with large, circumferential pericardial effusion. Reaching out to primary team. Comparison(s): No prior Echocardiogram. FINDINGS  Left Ventricle: DP/Dt diminised at 821 mm Hg/s. Left ventricular ejection fraction, by estimation, is 20 to 25%. The left ventricle has severely decreased function. The left ventricle demonstrates global hypokinesis. The left  ventricular internal cavity  size was moderately to severely dilated. There is no left ventricular hypertrophy. Left ventricular diastolic parameters are consistent with Grade III diastolic dysfunction (restrictive). Right Ventricle: The right ventricular size is moderately enlarged. No increase in right ventricular wall thickness. Right ventricular systolic function is moderately reduced. There is moderately elevated pulmonary artery systolic pressure. The tricuspid  regurgitant velocity is 2.99 m/s, and with an assumed right atrial pressure of 15 mmHg, the estimated right ventricular systolic pressure is 83.1 mmHg. Left Atrium: Left atrial size was moderately dilated. Right Atrium: Right atrial size was mildly dilated. Pericardium: A large pericardial effusion is present. Mitral Valve: The mitral valve is normal in structure. Moderate mitral valve regurgitation. Tricuspid Valve: The tricuspid valve is normal in structure. Tricuspid valve regurgitation is moderate. Aortic Valve: The aortic valve is tricuspid. Aortic valve regurgitation is not visualized. Pulmonic Valve: The pulmonic valve was not well visualized. Pulmonic valve regurgitation is trivial. Aorta: The aortic root is normal in size and structure and the ascending aorta was not well visualized. Venous: The pulmonary veins were not well visualized. The inferior vena cava is dilated in size with less than 50% respiratory variability, suggesting right atrial pressure of 15 mmHg. IAS/Shunts: No atrial level shunt detected by color flow Doppler.  LEFT VENTRICLE PLAX 2D LVIDd:         6.60 cm LVIDs:         5.90 cm LV PW:         1.10 cm LV IVS:        0.80 cm LVOT diam:     2.00 cm LV SV:         29 LV SV Index:   13 LVOT Area:     3.14 cm  LV Volumes (MOD) LV vol d,  MOD A2C: 194.0 ml LV vol d, MOD A4C: 182.0 ml LV vol s, MOD A2C: 167.0 ml LV vol s, MOD A4C: 134.0 ml LV SV MOD A2C:     27.0 ml LV SV MOD A4C:     182.0 ml LV SV MOD BP:      42.2 ml RIGHT  VENTRICLE RV S prime:     8.03 cm/s TAPSE (M-mode): 2.0 cm LEFT ATRIUM              Index       RIGHT ATRIUM           Index LA diam:        5.10 cm  2.30 cm/m  RA Area:     25.40 cm LA Vol (A2C):   94.1 ml  42.52 ml/m RA Volume:   82.10 ml  37.10 ml/m LA Vol (A4C):   96.2 ml  43.47 ml/m LA Biplane Vol: 104.0 ml 46.99 ml/m  AORTIC VALVE LVOT Vmax:   65.90 cm/s LVOT Vmean:  37.100 cm/s LVOT VTI:    0.094 m  AORTA Ao Root diam: 3.60 cm MITRAL VALVE                 TRICUSPID VALVE MV Area (PHT): 8.92 cm      TR Peak grad:   35.8 mmHg MV Decel Time: 85 msec       TR Vmax:        299.00 cm/s MR Peak grad:    63.7 mmHg MR Mean grad:    39.0 mmHg   SHUNTS MR Vmax:         399.00 cm/s Systemic VTI:  0.09 m MR Vmean:        292.0 cm/s  Systemic Diam: 2.00 cm MR PISA:         2.26 cm MR PISA Eff ROA: 14 mm MR PISA Radius:  0.60 cm MV E velocity: 1.05 cm/s MV A velocity: 24.40 cm/s MV E/A ratio:  0.04 Rudean Haskell MD Electronically signed by Rudean Haskell MD Signature Date/Time: 02/07/2020/12:44:43 PM    Final    ECHOCARDIOGRAM LIMITED  Result Date: 02/09/2020    ECHOCARDIOGRAM LIMITED REPORT   Patient Name:   Craig Hanson Date of Exam: 02/09/2020 Medical Rec #:  [id number]          Height:       68.0 in Accession #:    [id number]         Weight:       242.5 lb Date of Birth:  [birth year]           BSA:          2.218 m Patient Age:    7 years           BP:           118/85 mmHg Patient Gender: M                  HR:           105 bpm. Exam Location:  [facility] Procedure: Limited Echo Indications:    Pericardial Effusion  History:        Patient has prior history of Echocardiogram examinations, most                 recent 02/07/2020. CHF; Risk Factors:Non-Smoker.  Sonographer:    Leavy Cella RDCS (AE) Referring Phys: [place]  1. Left ventricular ejection fraction, by estimation,  is 20 to 25%. The left ventricle has severely decreased function. The left ventricle  demonstrates global hypokinesis.  2. Moderate to large pericardial effusion that is circumferential. Compared to 02/07/20 study there is some decrease in size of the effusion. No evidence of tamponade physiology by echo. . There is no evidence of cardiac tamponade.  3. The inferior vena cava is normal in size with greater than 50% respiratory variability, suggesting right atrial pressure of 3 mmHg.  4. Limited echo to evaluate pericardial effusion FINDINGS  Left Ventricle: Left ventricular ejection fraction, by estimation, is 20 to 25%. The left ventricle has severely decreased function. The left ventricle demonstrates global hypokinesis. Pericardium: Moderate to large pericardial effusion that is circumferential. Compared to 02/07/20 study there is some decrease in size of the effusion. No evidence of tamponade physiology by echo. There is no evidence of cardiac tamponade. Venous: The inferior vena cava is normal in size with greater than 50% respiratory variability, suggesting right atrial pressure of 3 mmHg. LEFT VENTRICLE PLAX 2D LVIDd:         6.27 cm  Diastology LVIDs:         5.90 cm  LV e' medial:    5.70 cm/s LV PW:         1.41 cm  LV E/e' medial:  14.0 LV IVS:        1.11 cm  LV e' lateral:   9.15 cm/s LVOT diam:     2.10 cm  LV E/e' lateral: 8.7 LVOT Area:     3.46 cm  LEFT ATRIUM         Index LA diam:    3.40 cm 1.53 cm/m   AORTA Ao Root diam: 3.20 cm MITRAL VALVE               TRICUSPID VALVE MV Area (PHT): 4.31 cm    TR Peak grad:   23.2 mmHg MV Decel Time: 176 msec    TR Vmax:        241.00 cm/s MR Peak grad: 63.7 mmHg MR Mean grad: 37.0 mmHg    SHUNTS MR Vmax:      399.00 cm/s  Systemic Diam: 2.10 cm MR Vmean:     283.0 cm/s MV E velocity: 79.90 cm/s MV A velocity: 35.00 cm/s MV E/A ratio:  2.28 Carlyle Dolly MD Electronically signed by Carlyle Dolly MD Signature Date/Time: 02/09/2020/4:02:26 PM    Final     Orson Eva, DO  [facility]  If 7PM-7AM, please contact  night-coverage [email or web address] Password [email or web address] 02/12/2020, 2:23 PM   LOS: 6 days

## 2020-02-12 NOTE — H&P (View-Only) (Signed)
 Progress Note  Patient Name: Craig Hanson Date of Encounter: 02/12/2020  [facility] Cardiologist: Samuel McDowell, MD   Subjective   Denies any SOB  Inpatient Medications    Scheduled Meds: . folic acid  1 mg Oral Daily  . furosemide  80 mg Intravenous BID  . heparin  5,000 Units Subcutaneous Q8H  . metoprolol succinate  25 mg Oral Daily  . multivitamin with minerals  1 tablet Oral Daily  . potassium chloride  40 mEq Oral Daily  . sacubitril-valsartan  1 tablet Oral BID  . sodium chloride flush  3 mL Intravenous Q12H  . spironolactone  12.5 mg Oral Daily  . thiamine  100 mg Oral Daily   Or  . thiamine  100 mg Intravenous Daily   Continuous Infusions: . sodium chloride     PRN Meds: sodium chloride, acetaminophen, ondansetron (ZOFRAN) IV, sodium chloride flush   Vital Signs    Vitals:   02/11/20 0815 02/11/20 1430 02/11/20 2133 02/12/20 0545  BP: 124/88 116/85 (!) 118/96 (!) 124/99  Pulse: (!) 110 (!) 107 98 (!) 109  Resp:   19 18  Temp:   97.8 F (36.6 C) 97.8 F (36.6 C)  TempSrc:      SpO2: 100% 99% 100% 100%  Weight:    105.4 kg  Height:        Intake/Output Summary (Last 24 hours) at 02/12/2020 0906 Last data filed at 02/12/2020 0500 Gross per 24 hour  Intake 1200 ml  Output 1100 ml  Net 100 ml   Last 3 Weights 02/12/2020 02/11/2020 02/10/2020  Weight (lbs) 232 lb 5.8 oz 234 lb 9.1 oz 238 lb 6.4 oz  Weight (kg) 105.4 kg 106.4 kg 108.138 kg      Telemetry    Sinus tach - Personally Reviewed  ECG    n/a - Personally Reviewed  Physical Exam   GEN: No acute distress.   Neck: elevated JVD Cardiac: RRR, no murmurs, rubs, or gallops.  Respiratory: Clear to auscultation bilaterally. GI: Soft, nontender, non-distended  MS: 2+ bilateral LE edema; No deformity. Neuro:  Nonfocal  Psych: Normal affect   Labs    High Sensitivity Troponin:  No results for input(s): TROPONINIHS in the last 720 hours.    Chemistry Recent Labs  Lab  02/06/20 1851 02/07/20 0907 02/08/20 0500 02/09/20 0549 02/10/20 0542 02/11/20 0624 02/12/20 0606  NA 133* 137 138   < > 135 137 136  K 3.5 3.9 4.4   < > 3.7 4.2 4.1  CL 96* 99 99   < > 97* 97* 99  CO2 26 29 29   < > 30 29 29  GLUCOSE 167* 115* 118*   < > 133* 121* 113*  BUN 23* 24* 26*   < > 15 17 20  CREATININE 1.11 1.00 1.03   < > 0.81 0.90 0.96  CALCIUM 8.4* 8.9 8.8*   < > 8.6* 8.6* 8.9  PROT 6.8 6.5 6.2*  --   --   --   --   ALBUMIN 3.2* 3.1* 2.9*  --   --   --   --   AST 50* 46* 48*  --   --   --   --   ALT 45* 43 43  --   --   --   --   ALKPHOS 119 119 120  --   --   --   --   BILITOT 1.4* 1.4* 1.3*  --   --   --   --     GFRNONAA >60 >60 >60   < > >60 >60 >60  ANIONGAP 11 9 10   < > 8 11 8   < > = values in this interval not displayed.     Hematology Recent Labs  Lab 02/06/20 1851 02/07/20 0907 02/08/20 0500  WBC 9.6 9.7 10.0  RBC 4.57 4.55 4.55  HGB 14.0 14.2 13.9  HCT 44.0 44.3 43.9  MCV 96.3 97.4 96.5  MCH 30.6 31.2 30.5  MCHC 31.8 32.1 31.7  RDW 16.4* 16.6* 16.7*  PLT 299 288 278    BNP Recent Labs  Lab 02/06/20 1851  BNP 2,881.0*     DDimer No results for input(s): DDIMER in the last 168 hours.   Radiology    No results found.  Cardiac Studies    Patient Profile     Craig Hansonis a 50 y.o.malewith a hx of EtOH abusewho is being seen today for the evaluation of SOBat the request of Dr Memon.  Assessment & Plan    1. Acute biventricular heart failure -incomplete I/Os yesterday, neg at least  6.6L since admission. Weights would indicate down additional 2 lbs sicne yesterday. He is on IV lasix 80mg bid Renal function remains stable. medical therapy with entresto 24/26mg bid, toprol 25mgaldactone 12.5  - worrisome presentation for very low CO in setting of severe LV dysfunction by echo and persistent sinus tachycardia. He is diuresing well with just IV lasix with stable renal function however. Careful titration of beta  blocker, f/u CI from upcoming cath.  -Will need RHC/LHC this admission given degree of dysfunction once he is euvolemic.  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 Symptoms started after his Nov COVID vaccination though I think this is coincidental, LAVI of 44 would suggest fairly long standing elevated filling pressures.  - ongoing fluid overload, at a point where could pursue cath. Plan for RHC/LHC tomorrow - continue IV diuresis.    I have reviewed the risks, indications, and alternatives to cardiac catheterization, possible angioplasty, and stenting with the patient today. Risks include but are not limited to bleeding, infection, vascular injury, stroke, myocardial infection, arrhythmia, kidney injury, radiation-related injury in the case of prolonged fluoroscopy use, emergency cardiac surgery, and death. The patient understands the risks of serious complication is 1-2 in 1000 with diagnostic cardiac cath and 1-2% or less with angioplasty/stenting.   2. Pericardial effusion - large by initial echo, repeat echo shows improving though still moderate to large. No tamponade findings -ESR 42, CRP pending, TSH 3.3 - perhaps inflammatory/post infectious - appears to be improving by serial imaging.   Plan to transfer to [place] today, LHC/RHC tomorrow.  For questions or updates, please contact [facility] Please consult [email or web address] for contact info under        Signed, Branch, Jonathan, MD  02/12/2020, 9:06 AM    

## 2020-02-13 ENCOUNTER — Encounter (HOSPITAL_COMMUNITY)
Admission: AD | Disposition: A | Discharge status: Home / Self Care | Payer: Self-pay | Source: Ambulatory Visit | Attending: Internal Medicine

## 2020-02-13 ENCOUNTER — Encounter (HOSPITAL_COMMUNITY): Payer: Self-pay | Admitting: Cardiology

## 2020-02-13 ENCOUNTER — Inpatient Hospital Stay: Payer: Self-pay

## 2020-02-13 DIAGNOSIS — E8779 Other fluid overload: Secondary | ICD-10-CM | POA: Diagnosis not present

## 2020-02-13 DIAGNOSIS — I5021 Acute systolic (congestive) heart failure: Secondary | ICD-10-CM | POA: Diagnosis not present

## 2020-02-13 DIAGNOSIS — I428 Other cardiomyopathies: Secondary | ICD-10-CM | POA: Diagnosis not present

## 2020-02-13 DIAGNOSIS — I5041 Acute combined systolic (congestive) and diastolic (congestive) heart failure: Secondary | ICD-10-CM | POA: Diagnosis not present

## 2020-02-13 HISTORY — PX: RIGHT/LEFT HEART CATH AND CORONARY ANGIOGRAPHY: CATH118266

## 2020-02-13 LAB — POCT I-STAT EG7
Acid-Base Excess: 2 mmol/L (ref 0.0–2.0)
Acid-Base Excess: 4 mmol/L — ABNORMAL HIGH (ref 0.0–2.0)
Acid-Base Excess: 7 mmol/L — ABNORMAL HIGH (ref 0.0–2.0)
Bicarbonate: 26.8 mmol/L (ref 20.0–28.0)
Bicarbonate: 29.2 mmol/L — ABNORMAL HIGH (ref 20.0–28.0)
Bicarbonate: 32.7 mmol/L — ABNORMAL HIGH (ref 20.0–28.0)
Calcium, Ion: 0.82 mmol/L — CL (ref 1.15–1.40)
Calcium, Ion: 1.01 mmol/L — ABNORMAL LOW (ref 1.15–1.40)
Calcium, Ion: 1.14 mmol/L — ABNORMAL LOW (ref 1.15–1.40)
HCT: 36 % — ABNORMAL LOW (ref 39.0–52.0)
HCT: 41 % (ref 39.0–52.0)
HCT: 43 % (ref 39.0–52.0)
Hemoglobin: 12.2 g/dL — ABNORMAL LOW (ref 13.0–17.0)
Hemoglobin: 13.9 g/dL (ref 13.0–17.0)
Hemoglobin: 14.6 g/dL (ref 13.0–17.0)
O2 Saturation: 64 %
O2 Saturation: 66 %
O2 Saturation: 68 %
Potassium: 3 mmol/L — ABNORMAL LOW (ref 3.5–5.1)
Potassium: 3.5 mmol/L (ref 3.5–5.1)
Potassium: 4 mmol/L (ref 3.5–5.1)
Sodium: 141 mmol/L (ref 135–145)
Sodium: 143 mmol/L (ref 135–145)
Sodium: 147 mmol/L — ABNORMAL HIGH (ref 135–145)
TCO2: 28 mmol/L (ref 22–32)
TCO2: 31 mmol/L (ref 22–32)
TCO2: 34 mmol/L — ABNORMAL HIGH (ref 22–32)
pCO2, Ven: 43.5 mmHg — ABNORMAL LOW (ref 44.0–60.0)
pCO2, Ven: 45 mmHg (ref 44.0–60.0)
pCO2, Ven: 47.9 mmHg (ref 44.0–60.0)
pH, Ven: 7.398 (ref 7.250–7.430)
pH, Ven: 7.421 (ref 7.250–7.430)
pH, Ven: 7.442 — ABNORMAL HIGH (ref 7.250–7.430)
pO2, Ven: 33 mmHg (ref 32.0–45.0)
pO2, Ven: 34 mmHg (ref 32.0–45.0)
pO2, Ven: 35 mmHg (ref 32.0–45.0)

## 2020-02-13 LAB — POCT I-STAT 7, (LYTES, BLD GAS, ICA,H+H)
Acid-Base Excess: 6 mmol/L — ABNORMAL HIGH (ref 0.0–2.0)
Bicarbonate: 30.3 mmol/L — ABNORMAL HIGH (ref 20.0–28.0)
Calcium, Ion: 1.05 mmol/L — ABNORMAL LOW (ref 1.15–1.40)
HCT: 41 % (ref 39.0–52.0)
Hemoglobin: 13.9 g/dL (ref 13.0–17.0)
O2 Saturation: 96 %
Potassium: 3.8 mmol/L (ref 3.5–5.1)
Sodium: 143 mmol/L (ref 135–145)
TCO2: 32 mmol/L (ref 22–32)
pCO2 arterial: 42.2 mmHg (ref 32.0–48.0)
pH, Arterial: 7.465 — ABNORMAL HIGH (ref 7.350–7.450)
pO2, Arterial: 79 mmHg — ABNORMAL LOW (ref 83.0–108.0)

## 2020-02-13 LAB — BASIC METABOLIC PANEL
Anion gap: 11 (ref 5–15)
BUN: 19 mg/dL (ref 6–20)
CO2: 28 mmol/L (ref 22–32)
Calcium: 9.1 mg/dL (ref 8.9–10.3)
Chloride: 98 mmol/L (ref 98–111)
Creatinine, Ser: 1.15 mg/dL (ref 0.61–1.24)
GFR, Estimated: >60 mL/min (ref >60)
Glucose, Bld: 135 mg/dL — ABNORMAL HIGH (ref 70–99)
Potassium: 4.6 mmol/L (ref 3.5–5.1)
Sodium: 137 mmol/L (ref 135–145)

## 2020-02-13 LAB — CBC
HCT: 43.4 % (ref 39.0–52.0)
Hemoglobin: 14 g/dL (ref 13.0–17.0)
MCH: 30.6 pg (ref 26.0–34.0)
MCHC: 32.3 g/dL (ref 30.0–36.0)
MCV: 94.8 fL (ref 80.0–100.0)
Platelets: 281 10*3/uL (ref 150–400)
RBC: 4.58 MIL/uL (ref 4.22–5.81)
RDW: 16.8 % — ABNORMAL HIGH (ref 11.5–15.5)
WBC: 9 10*3/uL (ref 4.0–10.5)
nRBC: 0 % (ref 0.0–0.2)

## 2020-02-13 SURGERY — RIGHT/LEFT HEART CATH AND CORONARY ANGIOGRAPHY
Anesthesia: LOCAL

## 2020-02-13 MED ORDER — VERAPAMIL HCL 2.5 MG/ML IV SOLN
INTRAVENOUS | Status: DC | PRN
  Administered 2020-02-13: 10 mL via INTRA_ARTERIAL

## 2020-02-13 MED ORDER — MIDAZOLAM HCL 2 MG/2ML IJ SOLN
INTRAMUSCULAR | Status: AC
  Filled 2020-02-13: qty 2

## 2020-02-13 MED ORDER — SODIUM CHLORIDE 0.9 % IV SOLN
250.0000 mL | INTRAVENOUS | Status: DC | PRN
  Administered 2020-02-14 – 2020-02-16 (×2): 250 mL via INTRAVENOUS

## 2020-02-13 MED ORDER — SODIUM CHLORIDE 0.9 % IV SOLN: 250.0000 mL | INTRAVENOUS | Status: DC | PRN

## 2020-02-13 MED ORDER — VERAPAMIL HCL 2.5 MG/ML IV SOLN
INTRAVENOUS | Status: AC
  Filled 2020-02-13: qty 2

## 2020-02-13 MED ORDER — ACETAMINOPHEN 325 MG PO TABS: 650.0000 mg | ORAL_TABLET | ORAL | Status: DC | PRN

## 2020-02-13 MED ORDER — SODIUM CHLORIDE 0.9 % IV SOLN: INTRAVENOUS | Status: DC

## 2020-02-13 MED ORDER — LIDOCAINE HCL (PF) 1 % IJ SOLN
INTRAMUSCULAR | Status: DC | PRN
  Administered 2020-02-13 (×2): 2 mL

## 2020-02-13 MED ORDER — SODIUM CHLORIDE 0.9% FLUSH: 3.0000 mL | INTRAVENOUS | Status: DC | PRN

## 2020-02-13 MED ORDER — ASPIRIN 81 MG PO CHEW
81.0000 mg | CHEWABLE_TABLET | ORAL | Status: AC
Start: 2020-02-13 — End: 2020-02-13
  Administered 2020-02-13: 81 mg via ORAL
  Filled 2020-02-13: qty 1

## 2020-02-13 MED ORDER — ENOXAPARIN SODIUM 40 MG/0.4ML ~~LOC~~ SOLN
40.0000 mg | SUBCUTANEOUS | Status: DC
  Administered 2020-02-14 – 2020-02-20 (×7): 40 mg via SUBCUTANEOUS
  Filled 2020-02-13 (×7): qty 0.4

## 2020-02-13 MED ORDER — HEPARIN SODIUM (PORCINE) 1000 UNIT/ML IJ SOLN
INTRAMUSCULAR | Status: DC | PRN
  Administered 2020-02-13: 5000 [IU] via INTRAVENOUS

## 2020-02-13 MED ORDER — LIDOCAINE HCL (PF) 1 % IJ SOLN
INTRAMUSCULAR | Status: AC
  Filled 2020-02-13: qty 30

## 2020-02-13 MED ORDER — FENTANYL CITRATE (PF) 100 MCG/2ML IJ SOLN
INTRAMUSCULAR | Status: DC | PRN
  Administered 2020-02-13: 25 ug via INTRAVENOUS

## 2020-02-13 MED ORDER — SODIUM CHLORIDE 0.9% FLUSH
3.0000 mL | Freq: Two times a day (BID) | INTRAVENOUS | Status: DC
  Administered 2020-02-13: 3 mL via INTRAVENOUS
  Administered 2020-02-13: 10 mL via INTRAVENOUS
  Administered 2020-02-14 – 2020-02-19 (×7): 3 mL via INTRAVENOUS
  Administered 2020-02-20: 20 mL via INTRAVENOUS

## 2020-02-13 MED ORDER — HEPARIN SODIUM (PORCINE) 1000 UNIT/ML IJ SOLN
INTRAMUSCULAR | Status: AC
  Filled 2020-02-13: qty 1

## 2020-02-13 MED ORDER — LABETALOL HCL 5 MG/ML IV SOLN: 10.0000 mg | INTRAVENOUS | Status: AC | PRN

## 2020-02-13 MED ORDER — SPIRONOLACTONE 25 MG PO TABS
25.0000 mg | ORAL_TABLET | Freq: Every day | ORAL | Status: DC
  Administered 2020-02-14 – 2020-02-20 (×7): 25 mg via ORAL
  Filled 2020-02-13 (×7): qty 1

## 2020-02-13 MED ORDER — IOHEXOL 350 MG/ML SOLN
INTRAVENOUS | Status: DC | PRN
  Administered 2020-02-13: 30 mL

## 2020-02-13 MED ORDER — FUROSEMIDE 10 MG/ML IJ SOLN
80.0000 mg | Freq: Three times a day (TID) | INTRAMUSCULAR | Status: AC
  Administered 2020-02-13 – 2020-02-17 (×11): 80 mg via INTRAVENOUS
  Filled 2020-02-13 (×11): qty 8

## 2020-02-13 MED ORDER — ONDANSETRON HCL 4 MG/2ML IJ SOLN: 4.0000 mg | Freq: Four times a day (QID) | INTRAMUSCULAR | Status: DC | PRN

## 2020-02-13 MED ORDER — HEPARIN (PORCINE) IN NACL 1000-0.9 UT/500ML-% IV SOLN
INTRAVENOUS | Status: DC | PRN
  Administered 2020-02-13 (×2): 500 mL

## 2020-02-13 MED ORDER — FENTANYL CITRATE (PF) 100 MCG/2ML IJ SOLN
INTRAMUSCULAR | Status: AC
  Filled 2020-02-13: qty 2

## 2020-02-13 MED ORDER — CHLORHEXIDINE GLUCONATE CLOTH 2 % EX PADS
6.0000 | MEDICATED_PAD | Freq: Every day | CUTANEOUS | Status: DC
  Administered 2020-02-13 – 2020-02-19 (×7): 6 via TOPICAL

## 2020-02-13 MED ORDER — DIGOXIN 125 MCG PO TABS
0.1250 mg | ORAL_TABLET | Freq: Every day | ORAL | Status: DC
  Administered 2020-02-13 – 2020-02-20 (×8): 0.125 mg via ORAL
  Filled 2020-02-13 (×8): qty 1

## 2020-02-13 MED ORDER — MIDAZOLAM HCL 2 MG/2ML IJ SOLN
INTRAMUSCULAR | Status: DC | PRN
  Administered 2020-02-13: 1 mg via INTRAVENOUS

## 2020-02-13 MED ORDER — SODIUM CHLORIDE 0.9% FLUSH
10.0000 mL | INTRAVENOUS | Status: DC | PRN
  Administered 2020-02-14: 10 mL

## 2020-02-13 MED ORDER — HEPARIN (PORCINE) IN NACL 1000-0.9 UT/500ML-% IV SOLN
INTRAVENOUS | Status: AC
  Filled 2020-02-13: qty 1000

## 2020-02-13 MED ORDER — SODIUM CHLORIDE 0.9% FLUSH
3.0000 mL | Freq: Two times a day (BID) | INTRAVENOUS | Status: DC
  Administered 2020-02-13: 3 mL via INTRAVENOUS

## 2020-02-13 MED ORDER — HYDRALAZINE HCL 20 MG/ML IJ SOLN
10.0000 mg | INTRAMUSCULAR | Status: AC | PRN
Start: 2020-02-13 — End: 2020-02-13

## 2020-02-13 SURGICAL SUPPLY — 10 items
CATH 5FR JL3.5 JR4 ANG PIG MP (CATHETERS) ×2 IMPLANT
CATH BALLN WEDGE 5F 110CM (CATHETERS) ×2 IMPLANT
DEVICE RAD COMP TR BAND LRG (VASCULAR PRODUCTS) ×2 IMPLANT
ELECT DEFIB PAD ADLT CADENCE (PAD) ×2 IMPLANT
GLIDESHEATH SLEND SS 6F .021 (SHEATH) ×2 IMPLANT
GUIDEWIRE INQWIRE 1.5J.035X260 (WIRE) ×1 IMPLANT
INQWIRE 1.5J .035X260CM (WIRE) ×2
PACK CARDIAC CATHETERIZATION (CUSTOM PROCEDURE TRAY) ×2 IMPLANT
SHEATH GLIDE SLENDER 4/5FR (SHEATH) ×2 IMPLANT
TRANSDUCER W/STOPCOCK (MISCELLANEOUS) ×2 IMPLANT

## 2020-02-13 NOTE — Progress Notes (Signed)
Orthopedic Tech Progress Note Patient Details:  Craig Hanson 16-May-1970 [id number]  Ortho Devices Type of Ortho Device: Roland Rack boot Ortho Device/Splint Location: BLE Ortho Device/Splint Interventions: Ordered,Application   Post Interventions Patient Tolerated: Well Instructions Provided: Care of device   [name] 02/13/2020, 4:50 PM

## 2020-02-13 NOTE — Progress Notes (Signed)
Progress Note  Patient Name: Craig Hanson Date of Encounter: 02/13/2020  Primary Cardiologist: Nona Dell, MD   Subjective   Doing well this AM. No specific complaints. No chest pain. Symptoms improved with diuretics.   Inpatient Medications    Scheduled Meds: . folic acid  1 mg Oral Daily  . furosemide  80 mg Intravenous BID  . heparin  5,000 Units Subcutaneous Q8H  . metoprolol succinate  25 mg Oral Daily  . multivitamin with minerals  1 tablet Oral Daily  . potassium chloride  40 mEq Oral Daily  . sacubitril-valsartan  1 tablet Oral BID  . sodium chloride flush  3 mL Intravenous Q12H  . sodium chloride flush  3 mL Intravenous Q12H  . spironolactone  12.5 mg Oral Daily  . thiamine  100 mg Oral Daily   Or  . thiamine  100 mg Intravenous Daily   Continuous Infusions: . sodium chloride    . sodium chloride    . sodium chloride 10 mL/hr at 02/13/20 0631   PRN Meds: sodium chloride, sodium chloride, acetaminophen, ondansetron (ZOFRAN) IV, sodium chloride flush, sodium chloride flush   Vital Signs    Vitals:   02/12/20 1642 02/12/20 2201 02/13/20 0306 02/13/20 0630  BP:  (!) 110/95 (!) 127/94 (!) 121/91  Pulse: (!) 109 (!) 109    Resp: 20 18 18 18   Temp:  (!) 97.5 F (36.4 C) 98.3 F (36.8 C)   TempSrc:  Oral Oral   SpO2: 99% 100% 100%   Weight:   104.5 kg   Height:        Intake/Output Summary (Last 24 hours) at 02/13/2020 0728 Last data filed at 02/13/2020 0631 Gross per 24 hour  Intake 480.7 ml  Output 1100 ml  Net -619.3 ml   Filed Weights   02/11/20 0535 02/12/20 0545 02/13/20 0306  Weight: 106.4 kg 105.4 kg 104.5 kg    Physical Exam   General: Well developed, well nourished, NAD Neck: Negative for carotid bruits. No JVD Lungs:Clear to ausculation bilaterally. No wheezes, rales, or rhonchi. Breathing is unlabored. Cardiovascular: RRR with S1 S2. No murmurs Abdomen: Soft, non-tender, non-distended. No obvious abdominal  masses. Extremities: 2+ BLE edema. Radial pulses 2+ bilaterally Neuro: Alert and oriented. No focal deficits. No facial asymmetry. MAE spontaneously. Psych: Responds to questions appropriately with normal affect.    Labs    Chemistry Recent Labs  Lab 02/06/20 1851 02/07/20 02/09/20 02/08/20 0500 02/09/20 0549 02/11/20 0624 02/12/20 0606 02/13/20 0419  NA 133* 137 138   < > 137 136 137  K 3.5 3.9 4.4   < > 4.2 4.1 4.6  CL 96* 99 99   < > 97* 99 98  CO2 26 29 29    < > 29 29 28   GLUCOSE 167* 115* 118*   < > 121* 113* 135*  BUN 23* 24* 26*   < > 17 20 19   CREATININE 1.11 1.00 1.03   < > 0.90 0.96 1.15  CALCIUM 8.4* 8.9 8.8*   < > 8.6* 8.9 9.1  PROT 6.8 6.5 6.2*  --   --   --   --   ALBUMIN 3.2* 3.1* 2.9*  --   --   --   --   AST 50* 46* 48*  --   --   --   --   ALT 45* 43 43  --   --   --   --   ALKPHOS 119 119  120  --   --   --   --   BILITOT 1.4* 1.4* 1.3*  --   --   --   --   GFRNONAA >60 >60 >60   < > >60 >60 >60  ANIONGAP 11 9 10    < > 11 8 11    < > = values in this interval not displayed.     Hematology Recent Labs  Lab 02/07/20 0907 02/08/20 0500 02/13/20 0419  WBC 9.7 10.0 9.0  RBC 4.55 4.55 4.58  HGB 14.2 13.9 14.0  HCT 44.3 43.9 43.4  MCV 97.4 96.5 94.8  MCH 31.2 30.5 30.6  MCHC 32.1 31.7 32.3  RDW 16.6* 16.7* 16.8*  PLT 288 278 281    Cardiac EnzymesNo results for input(s): TROPONINI in the last 168 hours. No results for input(s): TROPIPOC in the last 168 hours.   BNP Recent Labs  Lab 02/06/20 1851  BNP 2,881.0*     DDimer No results for input(s): DDIMER in the last 168 hours.   Radiology    No results found.  Telemetry    02/13/20 NSR- Personally Reviewed  ECG    No new tracing as of 02/13/20- Personally Reviewed  Cardiac Studies   Echo 02/09/20:  1. Left ventricular ejection fraction, by estimation, is 20 to 25%. The  left ventricle has severely decreased function. The left ventricle  demonstrates global hypokinesis.  2. Moderate  to large pericardial effusion that is circumferential.  Compared to 02/07/20 study there is some decrease in size of the effusion.  No evidence of tamponade physiology by echo. . There is no evidence of  cardiac tamponade.  3. The inferior vena cava is normal in size with greater than 50%  respiratory variability, suggesting right atrial pressure of 3 mmHg.  4. Limited echo to evaluate pericardial effusion   Echo 02/07/20:  1. Left ventricular ejection fraction, by estimation, is 20 to 25%. The  left ventricle has severely decreased function. The left ventricle  demonstrates global hypokinesis. The left ventricular internal cavity size  was moderately to severely dilated.  Left ventricular diastolic parameters are consistent with Grade III  diastolic dysfunction (restrictive).  2. Right ventricular systolic function is moderately reduced. The right  ventricular size is moderately enlarged. There is moderately elevated  pulmonary artery systolic pressure.  3. Left atrial size was moderately dilated.  4. Right atrial size was mildly dilated.  5. Large pericardial effusion.  6. The mitral valve is normal in structure. Moderate mitral valve  regurgitation.  7. Tricuspid valve regurgitation is moderate.  8. The aortic valve is tricuspid. Aortic valve regurgitation is not  visualized.  9. The inferior vena cava is dilated in size with <50% respiratory  variability, suggesting right atrial pressure of 15 mmHg. There is no  evidence of mitral or tricuspid valve peak veloxity inflow variation,  studies assumes respiratory maneuvers are  performed.  10. New Biventricular dsyfunction with large, circumferential pericardial  effusion. Reaching out to primary team.   Patient Profile     50 y.o. male with a hx of EtOH abusewho is being seen today for the evaluation of SOBat the request of Dr 02/09/20.  Assessment & Plan    1. Acute biventricular heart failure: -Pt see and followed  with Dr. 44 (inpatient) who presented with SOB and LE and scrotal edema>>>echo showed LVEF at 20-25% with persisitent tachycardia felt to be worrisome for low CO. CM felt to be secondary to ETOH. He was therefore transferred  to [facility] 02/12/20 for [facility] for further evaluation -Continue IV Lasix 80mg  BID  -Continue Entresto 24/26, spiro 12.5 -On Toprol ? -Cr holding normal at 1.15 -Weight, 230lb today with an admission weight at 244lb  -I&O, net negative 7.1L -Plan for [facility] today   2. Pericardial effusion: -Initially larger per echo however repeat study with improvement  -No tamponade noted  -CRP elevated at 69.9 -TSH at 3.3 -SOB improved with diuretics>>>continue   Signed, [facility] NP-C [facility] Pager: [phone] 02/13/2020, 7:28 AM     For questions or updates, please contact   Please consult [email or web address] for contact info under Cardiology/STEMI.

## 2020-02-13 NOTE — Progress Notes (Signed)
Report given to carelink and to nurse on 6 at approximately 0230.  All questions answered.  Patient in stable condition upon transfer.

## 2020-02-13 NOTE — Progress Notes (Signed)
Advanced Heart Failure Team Consult Note   Primary Physician: Heather Roberts, NP PCP-Cardiologist:  Nona Dell, MD  Reason for Consultation: Acute systolic HF  HPI:    Craig Hanson is seen today for evaluation of acute systolic HF at the request of Dr, Cristal Deer.   Craig Hanson 50 yo male with morbid obesity, asthma and  EtOH abuse (12 beers in a day, vodka shots in the evenings).  Denies any previous cardiac history. Presents with several weeks of HF symptoms. No angina.   Echo LVEF 20-25%, global hypokinesis, grade III dd, severe RV dysfunction, mod LAE, moderate pericardial effusion  Cath today with minimal CAD EF < 20%    Ao = 110/84 (95) LV = 106/33 RA =  20 RV = 59/29 PA = 62/20 (43) PCW = 38 Fick cardiac output/index = 5.1/2.3 SVR = 1186 PVR = 1.0 WU FA sat = 96% PA sat = 64%, 68% SVC sat = 66% PAPi = 2.1     Review of Systems: [y] = yes, [ ]  = no   . General: Weight gain Cove.Etienne ]; Weight loss [ ] ; Anorexia [ ] ; Fatigue Cove.Etienne ]; Fever [ ] ; Chills [ ] ; Weakness Cove.Etienne ]  . Cardiac: Chest pain/pressure [ ] ; Resting SOB Cove.Etienne ]; Exertional SOB [ ] y; Orthopnea [ y]; Pedal Edema Cove.Etienne ]; Palpitations [ ] ; Syncope [ ] ; Presyncope [ ] ; Paroxysmal nocturnal dyspnea[ ]   . Pulmonary: Cough Cove.Etienne ]; Wheezing[ y]; Hemoptysis[ ] ; Sputum [ ] ; Snoring Cove.Etienne ]  . GI: Vomiting[ ] ; Dysphagia[ ] ; Melena[ ] ; Hematochezia [ ] ; Heartburn[ ] ; Abdominal pain [ ] ; Constipation [ ] ; Diarrhea [ ] ; BRBPR [ ]   . GU: Hematuria[ ] ; Dysuria [ ] ; Nocturia[ ]   . Vascular: Pain in legs with walking [ ] ; Pain in feet with lying flat [ ] ; Non-healing sores [ ] ; Stroke [ ] ; TIA [ ] ; Slurred speech [ ] ;  . Neuro: Headaches[ ] ; Vertigo[ ] ; Seizures[ ] ; Paresthesias[ ] ;Blurred vision [ ] ; Diplopia [ ] ; Vision changes [ ]   . Ortho/Skin: Arthritis Cove.Etienne ]; Joint pain Cove.Etienne ]; Muscle pain [ ] ; Joint swelling [ ] ; Back Pain [ ] ; Rash [ ]   . Psych: Depression[ ] ; Anxiety[ ]   . Heme: Bleeding problems [ ] ; Clotting  disorders [ ] ; Anemia [ ]   . Endocrine: Diabetes [ ] ; Thyroid dysfunction[ ]   Home Medications Prior to Admission medications   Medication Sig Start Date End Date Taking? Authorizing Provider  furosemide (LASIX) 40 MG tablet Take 1 tablet (40 mg total) by mouth 2 (two) times daily. 02/05/20  Yes Heather Roberts, NP  potassium chloride SA (KLOR-CON) 20 MEQ tablet Take 1 tablet (20 mEq total) by mouth 2 (two) times daily. Administer medication when taking furosemide 02/05/20  Yes Heather Roberts, NP    Past Medical History: Past Medical History:  Diagnosis Date  . Asthma   . Pericardial effusion   . Systolic HF (heart failure) (HCC)     Past Surgical History: Past Surgical History:  Procedure Laterality Date  . No prior surgery    . RIGHT/LEFT HEART CATH AND CORONARY ANGIOGRAPHY N/A 02/13/2020   Procedure: RIGHT/LEFT HEART CATH AND CORONARY ANGIOGRAPHY;  Surgeon: Dolores Patty, MD;  Location: [facility] INVASIVE CV LAB;  Service: Cardiovascular;  Laterality: N/A;    Family History: Family History  Problem Relation Age of Onset  . Hypertension Mother   . Heart disease Father     Social History: Social History  Socioeconomic History  . Marital status: Married    Spouse name: Not on file  . Number of children: Not on file  . Years of education: Not on file  . 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 Smoking status: Never Smoker  . Smokeless tobacco: Never Used  Vaping Use  . Vaping Use: Never used  Substance and Sexual Activity  . Alcohol use: Yes    Comment: 1 gallon of vodka lasts 2 weeks  . Drug use: Never  . Sexual activity: Not on file  Other Topics Concern  . Not on file  Social History Narrative  . Not on file   Social Determinants of Health   Financial Resource Strain: Not on file  Food Insecurity: Not on file  Transportation Needs: Not on file  Physical Activity: Not on  file  Stress: Not on file  Social Connections: Not on file    Allergies:  No Known Allergies  Objective:    Vital Signs:   Temp:  [97.5 F (36.4 C)-98.3 F (36.8 C)] 97.6 F (36.4 C) (01/28 1704) Pulse Rate:  [109-112] 112 (01/28 1704) Resp:  [18] 18 (01/28 1704) BP: (103-127)/(82-95) 103/82 (01/28 1704) SpO2:  [100 %] 100 % (01/28 1038) Weight:  [104.5 kg] 104.5 kg (01/28 0306) Last BM Date: 02/12/20  Weight change: Filed Weights   02/11/20 0535 02/12/20 0545 02/13/20 0306  Weight: 106.4 kg 105.4 kg 104.5 kg    Intake/Output:   Intake/Output Summary (Last 24 hours) at 02/13/2020 1801 Last data filed at 02/13/2020 1512 Gross per 24 hour  Intake 39.19 ml  Output 1650 ml  Net -1610.81 ml      Physical Exam    General:  Obese male No resp difficulty HEENT: normal Neck: supple. JVP to ear . Carotids 2+ bilat; no bruits. No lymphadenopathy or thyromegaly appreciated. Cor: PMI nondisplaced. Regulart achy + s3 Lungs: clear Abdomen: obese soft, nontender, + distended. No hepatosplenomegaly. No bruits or masses. Good bowel sounds. Extremities: no cyanosis, clubbing, rash, 3-4+ edema + scrotal edema  Neuro: alert & orientedx3, cranial nerves grossly intact. moves all 4 extremities w/o difficulty. Affect pleasant   Telemetry   Sinus tach 100-120 Personally reviewed  EKG    Sinusttach 117 atypical LBBB ( ) Personally reviewed  Labs   Basic Metabolic Panel: Recent Labs  Lab 02/06/20 1851 02/07/20 0907 02/07/20 1104 02/08/20 0500 02/09/20 0549 02/10/20 0542 02/11/20 0624 02/12/20 0606 02/13/20 0419 02/13/20 1108 02/13/20 1110 02/13/20 1111 02/13/20 1119  NA 133*   < >  --    < > 137 135 137 136 137 143 143 141 147*  K 3.5   < >  --    < > 3.4* 3.7 4.2 4.1 4.6 3.8 3.5 4.0 3.0*  CL 96*   < >  --    < > 98 97* 97* 99 98  --   --   --   --   CO2 26   < >  --    < > 28 30 29 29 28   --   --   --   --   GLUCOSE 167*   < >  --    < > 111* 133* 121* 113*  135*  --   --   --   --   BUN 23*   < >  --    < > 19 15 17 20 19   --   --   --   --  CREATININE 1.11   < >  --    < > 0.84 0.81 0.90 0.96 1.15  --   --   --   --   CALCIUM 8.4*   < >  --    < > 8.4* 8.6* 8.6* 8.9 9.1  --   --   --   --   MG 2.2  --  2.2  --   --  2.0  --  2.1  --   --   --   --   --   PHOS 3.4  --   --   --   --   --   --   --   --   --   --   --   --    < > = values in this interval not displayed.    Liver Function Tests: Recent Labs  Lab 02/06/20 1851 02/07/20 0907 02/08/20 0500  AST 50* 46* 48*  ALT 45* 43 43  ALKPHOS 119 119 120  BILITOT 1.4* 1.4* 1.3*  PROT 6.8 6.5 6.2*  ALBUMIN 3.2* 3.1* 2.9*   No results for input(s): LIPASE, AMYLASE in the last 168 hours. No results for input(s): AMMONIA in the last 168 hours.  CBC: Recent Labs  Lab 02/06/20 1851 02/07/20 0907 02/08/20 0500 02/13/20 0419 02/13/20 1108 02/13/20 1110 02/13/20 1111 02/13/20 1119  WBC 9.6 9.7 10.0 9.0  --   --   --   --   NEUTROABS 7.9* 7.7 7.9*  --   --   --   --   --   HGB 14.0 14.2 13.9 14.0 13.9 13.9 14.6 12.2*  HCT 44.0 44.3 43.9 43.4 41.0 41.0 43.0 36.0*  MCV 96.3 97.4 96.5 94.8  --   --   --   --   PLT 299 288 278 281  --   --   --   --     Cardiac Enzymes: No results for input(s): CKTOTAL, CKMB, CKMBINDEX, TROPONINI in the last 168 hours.  BNP: BNP (last 3 results) Recent Labs    02/06/20 1851  BNP 2,881.0*    ProBNP (last 3 results) No results for input(s): PROBNP in the last 8760 hours.   CBG: No results for input(s): GLUCAP in the last 168 hours.  Coagulation Studies: No results for input(s): LABPROT, INR in the last 72 hours.   Imaging   CARDIAC CATHETERIZATION  Result Date: 02/13/2020  Ost LAD to Prox LAD lesion is 20% stenosed.  Ost Cx to Prox Cx lesion is 20% stenosed.  Findings: Ao = 110/84 (95) LV = 106/33 RA =  20 RV = 59/29 PA = 62/20 (43) PCW = 38 Fick cardiac output/index = 5.1/2.3 SVR = 1186 PVR = 1.0 WU FA sat = 96% PA sat = 64%, 68%  SVC sat = 66% PAPi = 2.1 Assessment: 1. Minimal nonobstructive CAD 2. 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 Markedly elevated filling pressures with preserved cardiac output Plan/Discussion: Continue diuresis and titration of GDMT. Place PICC. Avoid ETOH. Needs sleep study. cMRI on Monday. Arvilla Meres, MD 11:50 AM   [place] EKG SITE RITE  Result Date: 02/13/2020 If [facility] image not attached, placement could not be confirmed due to current cardiac rhythm.     Medications:     Current Medications: . Chlorhexidine Gluconate Cloth  6 each Topical Daily  . [START ON 02/14/2020] enoxaparin (LOVENOX) injection  40 mg Subcutaneous Q24H  . folic acid  1 mg Oral Daily  .  furosemide  80 mg Intravenous TID with meals  . metoprolol succinate  25 mg Oral Daily  . multivitamin with minerals  1 tablet Oral Daily  . potassium chloride  40 mEq Oral Daily  . sacubitril-valsartan  1 tablet Oral BID  . sodium chloride flush  3 mL Intravenous Q12H  . sodium chloride flush  3 mL Intravenous Q12H  . sodium chloride flush  3 mL Intravenous Q12H  . spironolactone  12.5 mg Oral Daily  . thiamine  100 mg Oral Daily   Or  . thiamine  100 mg Intravenous Daily     Infusions: . sodium chloride    . sodium chloride         Assessment/Plan   1. Acute systolic HF with severe biventricular dysfunction due to NICM - echo EF 20% severe RV dysfunction moderate pericardial effusion  - suspect ETOH related - cath 02/13/20 with minimal CAD - has marked volume overload - place PICC and UNNA boots - continue IV duiresis - titrate GDMT. Add digoxin. Hold b-blocker for now  - needs to stop ETOH - needs outpatient sleep study. +/- cMRI  2. Moderate pericardial effusion - no tamponade - will follow   3. ETOH abuse - counseled on cessation - CIWA protocol per primary team   Length of Stay: 7  Arvilla Meres, MD  02/13/2020, 6:01 PM  Advanced Heart Failure Team Pager [phone] (M-F; 7a - 4p)  Please contact [facility]  Cardiology for night-coverage after hours (4p -7a ) and weekends on [email or web address]

## 2020-02-13 NOTE — Interval H&P Note (Signed)
History and Physical Interval Note:  02/13/2020 10:55 AM  Craig Hanson  has presented today for surgery, with the diagnosis of acute heart failure.  The various methods of treatment have been discussed with the patient and family. After consideration of risks, benefits and other options for treatment, the patient has consented to  Procedure(s): RIGHT/LEFT HEART CATH AND CORONARY ANGIOGRAPHY (N/A) and possible coronary angioplasty as a surgical intervention.  The patient's history has been reviewed, patient examined, no change in status, stable for surgery.  I have reviewed the patient's chart and labs.  Questions were answered to the patient's satisfaction.     Jailine Lieder

## 2020-02-13 NOTE — Progress Notes (Signed)
Peripherally Inserted Central Catheter Placement  The IV Nurse has discussed with the patient and/or persons authorized to consent for the patient, the purpose of this procedure and the potential benefits and risks involved with this procedure.  The benefits include less needle sticks, lab draws from the catheter, and the patient may be discharged home with the catheter. Risks include, but not limited to, infection, bleeding, blood clot (thrombus formation), and puncture of an artery; nerve damage and irregular heartbeat and possibility to perform a PICC exchange if needed/ordered by physician.  Alternatives to this procedure were also discussed.  Bard Power PICC patient education guide, fact sheet on infection prevention and patient information card has been provided to patient /or left at bedside.    PICC Placement Documentation  PICC Double Lumen 02/13/20 PICC Right Brachial 44 cm 0 cm (Active)  Indication for Insertion or Continuance of Line Prolonged intravenous therapies 02/13/20 1453  Exposed Catheter (cm) 0 cm 02/13/20 1453  Site Assessment Clean;Dry;Intact 02/13/20 1453  Lumen #1 Status Flushed;Blood return noted;Saline locked 02/13/20 1453  Lumen #2 Status Flushed;Blood return noted;Saline locked 02/13/20 1453  Dressing Type Transparent 02/13/20 1453  Dressing Status Clean;Dry;Intact 02/13/20 1453  Antimicrobial disc in place? Yes 02/13/20 1453  Dressing Change Due 02/20/20 02/13/20 1453       Audrie Gallus 02/13/2020, 2:55 PM

## 2020-02-13 NOTE — Progress Notes (Signed)
   02/13/20 0306  Assess: MEWS Score  Temp 98.3 F (36.8 C)  BP (!) 127/94  ECG Heart Rate (!) 115  Resp 18  Level of Consciousness Alert  SpO2 100 %  O2 Device Room Air  Assess: MEWS Score  MEWS Temp 0  MEWS Systolic 0  MEWS Pulse 2  MEWS RR 0  MEWS LOC 0  MEWS Score 2  MEWS Score Color Yellow  Assess: if the MEWS score is Yellow or Red  Were vital signs taken at a resting state? Yes  Focused Assessment No change from prior assessment  Early Detection of Sepsis Score *See Row Information* Low  MEWS guidelines implemented *See Row Information* Yes  Treat  MEWS Interventions Escalated (See documentation below)  Take Vital Signs  Increase Vital Sign Frequency  Yellow: Q 2hr X 2 then Q 4hr X 2, if remains yellow, continue Q 4hrs  Escalate  MEWS: Escalate Yellow: discuss with charge nurse/RN and consider discussing with provider and RRT  Notify: Charge Nurse/RN  Name of Charge Nurse/RN Notified Annabelle Harman  Date Charge Nurse/RN Notified 02/13/20  Time Charge Nurse/RN Notified 0306  Document  Patient Outcome Other (Comment) (pt is stable)  Progress note created (see row info) Yes

## 2020-02-14 DIAGNOSIS — I5021 Acute systolic (congestive) heart failure: Secondary | ICD-10-CM | POA: Diagnosis not present

## 2020-02-14 LAB — BASIC METABOLIC PANEL
Anion gap: 10 (ref 5–15)
BUN: 22 mg/dL — ABNORMAL HIGH (ref 6–20)
CO2: 28 mmol/L (ref 22–32)
Calcium: 8.7 mg/dL — ABNORMAL LOW (ref 8.9–10.3)
Chloride: 99 mmol/L (ref 98–111)
Creatinine, Ser: 1.14 mg/dL (ref 0.61–1.24)
GFR, Estimated: >60 mL/min (ref >60)
Glucose, Bld: 162 mg/dL — ABNORMAL HIGH (ref 70–99)
Potassium: 4.3 mmol/L (ref 3.5–5.1)
Sodium: 137 mmol/L (ref 135–145)

## 2020-02-14 LAB — MAGNESIUM: Magnesium: 2.2 mg/dL (ref 1.7–2.4)

## 2020-02-14 LAB — COOXEMETRY PANEL
Carboxyhemoglobin: 0.9 % (ref 0.5–1.5)
Carboxyhemoglobin: 0.9 % (ref 0.5–1.5)
Methemoglobin: 0.8 % (ref 0.0–1.5)
Methemoglobin: 0.8 % (ref 0.0–1.5)
O2 Saturation: 42.8 %
O2 Saturation: 44.4 %
Total hemoglobin: 13.8 g/dL (ref 12.0–16.0)
Total hemoglobin: 13.8 g/dL (ref 12.0–16.0)

## 2020-02-14 MED ORDER — MILRINONE LACTATE IN DEXTROSE 20-5 MG/100ML-% IV SOLN
0.1250 ug/kg/min | INTRAVENOUS | Status: DC
  Administered 2020-02-14 – 2020-02-16 (×3): 0.125 ug/kg/min via INTRAVENOUS
  Filled 2020-02-14 (×2): qty 100

## 2020-02-14 NOTE — Progress Notes (Addendum)
Advanced Heart Failure Team Rounding Note   Primary Physician: Heather Roberts, NP PCP-Cardiologist:  Nona Dell, MD  Reason for Consultation: Acute systolic HF  Subjective:   Diuresing well on lasix gtt. Weight down 5 pounds overnight. Breathing better. Denies CP.   Co-ox 43% this am. CVP 27  No other labs.   Echo LVEF 20-25%, global hypokinesis, grade III dd, severe RV dysfunction, mod LAE, moderate pericardial effusion  Cath 1/28 with minimal CAD EF < 20%    Ao = 110/84 (95) LV = 106/33 RA =  20 RV = 59/29 PA = 62/20 (43) PCW = 38 Fick cardiac output/index = 5.1/2.3 SVR = 1186 PVR = 1.0 WU FA sat = 96% PA sat = 64%, 68% SVC sat = 66% PAPi = 2.1    Objective:    Vital Signs:   Temp:  [97.6 F (36.4 C)-98.9 F (37.2 C)] 98.4 F (36.9 C) (01/29 1300) Pulse Rate:  [101-112] 110 (01/29 1300) Resp:  [17-18] 17 (01/29 1300) BP: (103-117)/(76-93) 117/76 (01/29 1300) SpO2:  [96 %-100 %] 100 % (01/29 1300) Weight:  [102.2 kg] 102.2 kg (01/29 0352) Last BM Date: 02/13/20  Weight change: Filed Weights   02/12/20 0545 02/13/20 0306 02/14/20 0352  Weight: 105.4 kg 104.5 kg 102.2 kg    Intake/Output:   Intake/Output Summary (Last 24 hours) at 02/14/2020 1321 Last data filed at 02/14/2020 1200 Gross per 24 hour  Intake 38.49 ml  Output 2000 ml  Net -1961.51 ml      Physical Exam    General:  Obese male sitting up in chair  No resp difficulty HEENT: normal Neck: supple. JVP to ear Carotids 2+ bilat; no bruits. No lymphadenopathy or thryomegaly appreciated. Cor: PMI nondisplaced. Tachy regular + s3 Lungs: clear Abdomen: obese  soft, nontender, + distended. No hepatosplenomegaly. No bruits or masses. Good bowel sounds. Extremities: no cyanosis, clubbing, rash, 4+ edema into thighs + UNNA Neuro: alert & orientedx3, cranial nerves grossly intact. moves all 4 extremities w/o difficulty. Affect pleasant   Telemetry   Sinus tach 100-110 Personally  reviewed  Labs   Basic Metabolic Panel: Recent Labs  Lab 02/09/20 0549 02/10/20 0542 02/11/20 0624 02/12/20 0606 02/13/20 0419 02/13/20 1108 02/13/20 1110 02/13/20 1111 02/13/20 1119  NA 137 135 137 136 137 143 143 141 147*  K 3.4* 3.7 4.2 4.1 4.6 3.8 3.5 4.0 3.0*  CL 98 97* 97* 99 98  --   --   --   --   CO2 28 30 29 29 28   --   --   --   --   GLUCOSE 111* 133* 121* 113* 135*  --   --   --   --   BUN 19 15 17 20 19   --   --   --   --   CREATININE 0.84 0.81 0.90 0.96 1.15  --   --   --   --   CALCIUM 8.4* 8.6* 8.6* 8.9 9.1  --   --   --   --   MG  --  2.0  --  2.1  --   --   --   --   --     Liver Function Tests: Recent Labs  Lab 02/08/20 0500  AST 48*  ALT 43  ALKPHOS 120  BILITOT 1.3*  PROT 6.2*  ALBUMIN 2.9*   No results for input(s): LIPASE, AMYLASE in the last 168 hours. No results for input(s): AMMONIA in  the last 168 hours.  CBC: Recent Labs  Lab 02/08/20 0500 02/13/20 0419 02/13/20 1108 02/13/20 1110 02/13/20 1111 02/13/20 1119  WBC 10.0 9.0  --   --   --   --   NEUTROABS 7.9*  --   --   --   --   --   HGB 13.9 14.0 13.9 13.9 14.6 12.2*  HCT 43.9 43.4 41.0 41.0 43.0 36.0*  MCV 96.5 94.8  --   --   --   --   PLT 278 281  --   --   --   --     Cardiac Enzymes: No results for input(s): CKTOTAL, CKMB, CKMBINDEX, TROPONINI in the last 168 hours.  BNP: BNP (last 3 results) Recent Labs    02/06/20 1851  BNP 2,881.0*    ProBNP (last 3 results) No results for input(s): PROBNP in the last 8760 hours.   CBG: No results for input(s): GLUCAP in the last 168 hours.  Coagulation Studies: No results for input(s): LABPROT, INR in the last 72 hours.   Imaging   No results found.   Medications:     Current Medications: . Chlorhexidine Gluconate Cloth  6 each Topical Daily  . digoxin  0.125 mg Oral Daily  . enoxaparin (LOVENOX) injection  40 mg Subcutaneous Q24H  . folic acid  1 mg Oral Daily  . furosemide  80 mg Intravenous TID with  meals  . multivitamin with minerals  1 tablet Oral Daily  . potassium chloride  40 mEq Oral Daily  . sacubitril-valsartan  1 tablet Oral BID  . sodium chloride flush  3 mL Intravenous Q12H  . sodium chloride flush  3 mL Intravenous Q12H  . spironolactone  25 mg Oral Daily  . thiamine  100 mg Oral Daily   Or  . thiamine  100 mg Intravenous Daily    Infusions: . sodium chloride        Assessment/Plan   1. Acute systolic HF with severe biventricular dysfunction due to NICM - echo EF 20% severe RV dysfunction moderate pericardial effusion  - suspect ETOH related - cath 02/13/20 with minimal CAD and massively volume overload - Decent diuresis but co-ox low at 43% and remains massively volume overloaded.  - Will recheck co-ox and BMET stat.  - If co-ox < 50% start milrinone - Continue digoxin and spiro. Add low dose Entresto - Hold b-blocker for now with low output - needs to stop ETOH - needs outpatient sleep study. +/- cMRI  2. Moderate pericardial effusion - no tamponade - will follow   3. ETOH abuse - counseled on cessation - CIWA protocol per primary team   Length of Stay: 8  Arvilla Meres, MD  02/14/2020, 1:21 PM  Advanced Heart Failure Team Pager [phone] (M-F; 7a - 4p)  Please contact [facility] for night-coverage after hours (4p -7a ) and weekends on [email or web address]

## 2020-02-14 NOTE — Plan of Care (Signed)
  Problem: Education: Goal: Knowledge of General Education information will improve Description: Including pain rating scale, medication(s)/side effects and non-pharmacologic comfort measures Outcome: Progressing   Problem: Clinical Measurements: Goal: Will remain free from infection Outcome: Progressing Goal: Diagnostic test results will improve Outcome: Progressing   Problem: Activity: Goal: Risk for activity intolerance will decrease Outcome: Progressing   Problem: Education: Goal: Ability to verbalize understanding of medication therapies will improve Outcome: Progressing   Problem: Activity: Goal: Capacity to carry out activities will improve Outcome: Progressing   Problem: Cardiac: Goal: Ability to achieve and maintain adequate cardiopulmonary perfusion will improve Outcome: Progressing

## 2020-02-15 DIAGNOSIS — I5041 Acute combined systolic (congestive) and diastolic (congestive) heart failure: Secondary | ICD-10-CM | POA: Diagnosis not present

## 2020-02-15 DIAGNOSIS — I428 Other cardiomyopathies: Secondary | ICD-10-CM | POA: Diagnosis not present

## 2020-02-15 DIAGNOSIS — E8779 Other fluid overload: Secondary | ICD-10-CM

## 2020-02-15 LAB — BASIC METABOLIC PANEL
Anion gap: 8 (ref 5–15)
BUN: 18 mg/dL (ref 6–20)
CO2: 31 mmol/L (ref 22–32)
Calcium: 8.9 mg/dL (ref 8.9–10.3)
Chloride: 98 mmol/L (ref 98–111)
Creatinine, Ser: 1.06 mg/dL (ref 0.61–1.24)
GFR, Estimated: >60 mL/min (ref >60)
Glucose, Bld: 88 mg/dL (ref 70–99)
Potassium: 3.9 mmol/L (ref 3.5–5.1)
Sodium: 137 mmol/L (ref 135–145)

## 2020-02-15 LAB — COOXEMETRY PANEL
Carboxyhemoglobin: 0.9 % (ref 0.5–1.5)
Methemoglobin: 0.8 % (ref 0.0–1.5)
O2 Saturation: 53.1 %
Total hemoglobin: 13.1 g/dL (ref 12.0–16.0)

## 2020-02-15 NOTE — Progress Notes (Signed)
Advanced Heart Failure Team Rounding Note   Primary Physician: Heather Roberts, NP PCP-Cardiologist:  Nona Dell, MD  Reason for Consultation: Acute systolic HF  Subjective:   Milrinone added 1/29 for co-ox 43%. Up to 53% today.   Massive diuresis overnight. Nearly 8L out. Weight down 6 pounds. (25 pounds total). Breathing better. No orthopnea or PND. Renal function stable. K 3.9. CVP remains 15   Echo LVEF 20-25%, global hypokinesis, grade III dd, severe RV dysfunction, mod LAE, moderate pericardial effusion  Cath 1/28 with minimal CAD EF < 20%   Ao = 110/84 (95) LV = 106/33 RA =  20 RV = 59/29 PA = 62/20 (43) PCW = 38 Fick cardiac output/index = 5.1/2.3 SVR = 1186 PVR = 1.0 WU FA sat = 96% PA sat = 64%, 68% SVC sat = 66% PAPi = 2.1    Objective:    Vital Signs:   Temp:  [97.6 F (36.4 C)-98.4 F (36.9 C)] 98 F (36.7 C) (01/30 0828) Pulse Rate:  [101-118] 118 (01/30 0828) Resp:  [17-20] 18 (01/30 0828) BP: (115-135)/(76-96) 128/82 (01/30 0828) SpO2:  [99 %-100 %] 99 % (01/30 0828) Weight:  [99.4 kg] 99.4 kg (01/30 0451) Last BM Date: 02/14/20  Weight change: Filed Weights   02/13/20 0306 02/14/20 0352 02/15/20 0451  Weight: 104.5 kg 102.2 kg 99.4 kg    Intake/Output:   Intake/Output Summary (Last 24 hours) at 02/15/2020 1035 Last data filed at 02/15/2020 0445 Gross per 24 hour  Intake 730 ml  Output 7525 ml  Net -6795 ml      Physical Exam    General:  Obese male sitting up in chair  No resp difficulty HEENT: normal Neck: supple. JVP to ear. Carotids 2+ bilat; no bruits. No lymphadenopathy or thryomegaly appreciated. Cor: PMI nondisplaced. Tachy regular + s3 Lungs: clear Abdomen: obese soft, nontender, + distended. No hepatosplenomegaly. No bruits or masses. Good bowel sounds. Extremities: no cyanosis, clubbing, rash, 2-3+ edema Neuro: alert & orientedx3, cranial nerves grossly intact. moves all 4 extremities w/o difficulty. Affect  pleasant    Telemetry   Sinus tach 110-120  Personally reviewed  Labs   Basic Metabolic Panel: Recent Labs  Lab 02/10/20 0542 02/11/20 0624 02/12/20 0606 02/13/20 0419 02/13/20 1108 02/13/20 1110 02/13/20 1111 02/13/20 1119 02/14/20 1421 02/15/20 0450  NA 135 137 136 137   < > 143 141 147* 137 137  K 3.7 4.2 4.1 4.6   < > 3.5 4.0 3.0* 4.3 3.9  CL 97* 97* 99 98  --   --   --   --  99 98  CO2 30 29 29 28   --   --   --   --  28 31  GLUCOSE 133* 121* 113* 135*  --   --   --   --  162* 88  BUN 15 17 20 19   --   --   --   --  22* 18  CREATININE 0.81 0.90 0.96 1.15  --   --   --   --  1.14 1.06  CALCIUM 8.6* 8.6* 8.9 9.1  --   --   --   --  8.7* 8.9  MG 2.0  --  2.1  --   --   --   --   --  2.2  --    < > = values in this interval not displayed.    Liver Function Tests: No results for input(s): AST, ALT,  ALKPHOS, BILITOT, PROT, ALBUMIN in the last 168 hours. No results for input(s): LIPASE, AMYLASE in the last 168 hours. No results for input(s): AMMONIA in the last 168 hours.  CBC: Recent Labs  Lab 02/13/20 0419 02/13/20 1108 02/13/20 1110 02/13/20 1111 02/13/20 1119  WBC 9.0  --   --   --   --   HGB 14.0 13.9 13.9 14.6 12.2*  HCT 43.4 41.0 41.0 43.0 36.0*  MCV 94.8  --   --   --   --   PLT 281  --   --   --   --     Cardiac Enzymes: No results for input(s): CKTOTAL, CKMB, CKMBINDEX, TROPONINI in the last 168 hours.  BNP: BNP (last 3 results) Recent Labs    02/06/20 1851  BNP 2,881.0*    ProBNP (last 3 results) No results for input(s): PROBNP in the last 8760 hours.   CBG: No results for input(s): GLUCAP in the last 168 hours.  Coagulation Studies: No results for input(s): LABPROT, INR in the last 72 hours.   Imaging   No results found.   Medications:     Current Medications: . Chlorhexidine Gluconate Cloth  6 each Topical Daily  . digoxin  0.125 mg Oral Daily  . enoxaparin (LOVENOX) injection  40 mg Subcutaneous Q24H  . folic acid  1  mg Oral Daily  . furosemide  80 mg Intravenous TID with meals  . multivitamin with minerals  1 tablet Oral Daily  . potassium chloride  40 mEq Oral Daily  . sacubitril-valsartan  1 tablet Oral BID  . sodium chloride flush  3 mL Intravenous Q12H  . sodium chloride flush  3 mL Intravenous Q12H  . spironolactone  25 mg Oral Daily  . thiamine  100 mg Oral Daily   Or  . thiamine  100 mg Intravenous Daily    Infusions: . sodium chloride 250 mL (02/14/20 1642)  . milrinone 0.125 mcg/kg/min (02/14/20 1641)      Assessment/Plan   1. Acute systolic HF with severe biventricular dysfunction due to NICM - echo EF 20% severe RV dysfunction moderate pericardial effusion  - suspect ETOH related - cath 02/13/20 with minimal CAD and massively volume overload - Milrinone added 1/29 co-ox 43-> 53% - Excellent diuresis on lasix 80 IV tid. Will continue. Can cut back as needed - Continue digoxin, spiro 25, Entresto 24/26 - Watck K - Hold b-blocker for now with low output - needs to stop ETOH - needs outpatient sleep study. +/- cMRI  2. Moderate pericardial effusion - no tamponade - will follow   3. ETOH abuse - counseled on cessation - CIWA protocol per primary team   Length of Stay: 9  Arvilla Meres, MD  02/15/2020, 10:35 AM  Advanced Heart Failure Team Pager [phone] (M-F; 7a - 4p)  Please contact [facility] for night-coverage after hours (4p -7a ) and weekends on [email or web address]

## 2020-02-16 ENCOUNTER — Other Ambulatory Visit: Payer: Self-pay | Admitting: Nurse Practitioner

## 2020-02-16 DIAGNOSIS — I5021 Acute systolic (congestive) heart failure: Secondary | ICD-10-CM | POA: Diagnosis not present

## 2020-02-16 LAB — BASIC METABOLIC PANEL
Anion gap: 12 (ref 5–15)
BUN: 13 mg/dL (ref 6–20)
CO2: 29 mmol/L (ref 22–32)
Calcium: 8.8 mg/dL — ABNORMAL LOW (ref 8.9–10.3)
Chloride: 95 mmol/L — ABNORMAL LOW (ref 98–111)
Creatinine, Ser: 1.04 mg/dL (ref 0.61–1.24)
GFR, Estimated: >60 mL/min (ref >60)
Glucose, Bld: 104 mg/dL — ABNORMAL HIGH (ref 70–99)
Potassium: 3.8 mmol/L (ref 3.5–5.1)
Sodium: 136 mmol/L (ref 135–145)

## 2020-02-16 LAB — COOXEMETRY PANEL
Carboxyhemoglobin: 1.1 % (ref 0.5–1.5)
Methemoglobin: 0.7 % (ref 0.0–1.5)
O2 Saturation: 63 %
Total hemoglobin: 14 g/dL (ref 12.0–16.0)

## 2020-02-16 MED ORDER — IVABRADINE HCL 5 MG PO TABS
5.0000 mg | ORAL_TABLET | Freq: Two times a day (BID) | ORAL | Status: DC
  Administered 2020-02-17 – 2020-02-20 (×7): 5 mg via ORAL
  Filled 2020-02-16 (×9): qty 1

## 2020-02-16 MED ORDER — IVABRADINE HCL 5 MG PO TABS
5.0000 mg | ORAL_TABLET | Freq: Once | ORAL | Status: AC
  Administered 2020-02-16: 5 mg via ORAL
  Filled 2020-02-16: qty 1

## 2020-02-16 MED ORDER — POTASSIUM CHLORIDE CRYS ER 20 MEQ PO TBCR
40.0000 meq | EXTENDED_RELEASE_TABLET | Freq: Once | ORAL | Status: AC
  Administered 2020-02-16: 40 meq via ORAL
  Filled 2020-02-16: qty 2

## 2020-02-16 NOTE — Care Management (Signed)
2426 02-16-20 Benefits check submitted for Corlanor and Entresto. Case Manager will follow for cost. Graves-Bigelow, Lamar Laundry, RN,BSN Case Manager

## 2020-02-16 NOTE — Progress Notes (Addendum)
Advanced Heart Failure Team Rounding Note   Primary Physician: Heather Roberts, NP PCP-Cardiologist:  Nona Dell, MD  Reason for Consultation: Acute systolic HF  Subjective:   Milrinone added 1/29 for co-ox 43%. Up to 63% today on 0.125 mcg/kg/min.   Massive diuresis overnight w/ another 8L out.  Overall I/Os - 22L. Weight down 13 pounds from yesterday. (38 pounds total). CVP 13   SCr stable. K 3.8.   Sinus tach 110s on tele.   Overall, feels much better. Breathing improved. No complaints today.    Echo LVEF 20-25%, global hypokinesis, grade III dd, severe RV dysfunction, mod LAE, moderate pericardial effusion  Cath 1/28 with minimal CAD EF < 20%   Ao = 110/84 (95) LV = 106/33 RA =  20 RV = 59/29 PA = 62/20 (43) PCW = 38 Fick cardiac output/index = 5.1/2.3 SVR = 1186 PVR = 1.0 WU FA sat = 96% PA sat = 64%, 68% SVC sat = 66% PAPi = 2.1    Objective:    Vital Signs:   Temp:  [97.6 F (36.4 C)-98.3 F (36.8 C)] 98.2 F (36.8 C) (01/31 0541) Pulse Rate:  [116-120] 117 (01/31 0541) Resp:  [17-19] 18 (01/31 0541) BP: (113-129)/(70-88) 120/85 (01/31 0541) SpO2:  [99 %-100 %] 100 % (01/31 0020) Weight:  [93.5 kg] 93.5 kg (01/31 0541) Last BM Date: 02/15/20  Weight change: Filed Weights   02/14/20 0352 02/15/20 0451 02/16/20 0541  Weight: 102.2 kg 99.4 kg 93.5 kg    Intake/Output:   Intake/Output Summary (Last 24 hours) at 02/16/2020 0751 Last data filed at 02/15/2020 2149 Gross per 24 hour  Intake 540 ml  Output 8075 ml  Net -7535 ml      Physical Exam    PHYSICAL EXAM: CVP 13-14 General:  Well appearing, sitting up in bed. No respiratory difficulty HEENT: normal Neck: supple. JVD elevated to jaw. Carotids 2+ bilat; no bruits. No lymphadenopathy or thyromegaly appreciated. Cor: PMI nondisplaced. Regular rhythm, tachy rate. No rubs, gallops or murmurs. Lungs: decreased BS RLL, otherwise clear, no wheezing  Abdomen: soft, nontender,  nondistended. No hepatosplenomegaly. No bruits or masses. Good bowel sounds. Extremities: no cyanosis, clubbing, rash, 1+ bilateral LEE up to thighs, bilateral unna boots Neuro: alert & oriented x 3, cranial nerves grossly intact. moves all 4 extremities w/o difficulty. Affect pleasant.   Telemetry   Sinus tach 110s   Personally reviewed  Labs   Basic Metabolic Panel: Recent Labs  Lab 02/10/20 0542 02/11/20 9242 02/12/20 0606 02/13/20 0419 02/13/20 1108 02/13/20 1111 02/13/20 1119 02/14/20 1421 02/15/20 0450 02/16/20 0553  NA 135   < > 136 137   < > 141 147* 137 137 136  K 3.7   < > 4.1 4.6   < > 4.0 3.0* 4.3 3.9 3.8  CL 97*   < > 99 98  --   --   --  99 98 95*  CO2 30   < > 29 28  --   --   --  28 31 29   GLUCOSE 133*   < > 113* 135*  --   --   --  162* 88 104*  BUN 15   < > 20 19  --   --   --  22* 18 13  CREATININE 0.81   < > 0.96 1.15  --   --   --  1.14 1.06 1.04  CALCIUM 8.6*   < > 8.9 9.1  --   --   --  8.7* 8.9 8.8*  MG 2.0  --  2.1  --   --   --   --  2.2  --   --    < > = values in this interval not displayed.    Liver Function Tests: No results for input(s): AST, ALT, ALKPHOS, BILITOT, PROT, ALBUMIN in the last 168 hours. No results for input(s): LIPASE, AMYLASE in the last 168 hours. No results for input(s): AMMONIA in the last 168 hours.  CBC: Recent Labs  Lab 02/13/20 0419 02/13/20 1108 02/13/20 1110 02/13/20 1111 02/13/20 1119  WBC 9.0  --   --   --   --   HGB 14.0 13.9 13.9 14.6 12.2*  HCT 43.4 41.0 41.0 43.0 36.0*  MCV 94.8  --   --   --   --   PLT 281  --   --   --   --     Cardiac Enzymes: No results for input(s): CKTOTAL, CKMB, CKMBINDEX, TROPONINI in the last 168 hours.  BNP: BNP (last 3 results) Recent Labs    02/06/20 1851  BNP 2,881.0*    ProBNP (last 3 results) No results for input(s): PROBNP in the last 8760 hours.   CBG: No results for input(s): GLUCAP in the last 168 hours.  Coagulation Studies: No results for  input(s): LABPROT, INR in the last 72 hours.   Imaging   No results found.   Medications:     Current Medications: . Chlorhexidine Gluconate Cloth  6 each Topical Daily  . digoxin  0.125 mg Oral Daily  . enoxaparin (LOVENOX) injection  40 mg Subcutaneous Q24H  . folic acid  1 mg Oral Daily  . furosemide  80 mg Intravenous TID with meals  . multivitamin with minerals  1 tablet Oral Daily  . potassium chloride  40 mEq Oral Daily  . sacubitril-valsartan  1 tablet Oral BID  . sodium chloride flush  3 mL Intravenous Q12H  . sodium chloride flush  3 mL Intravenous Q12H  . spironolactone  25 mg Oral Daily  . thiamine  100 mg Oral Daily   Or  . thiamine  100 mg Intravenous Daily    Infusions: . sodium chloride 250 mL (02/16/20 0100)  . milrinone 0.125 mcg/kg/min (02/15/20 1056)      Assessment/Plan   1. Acute systolic HF with severe biventricular dysfunction due to NICM - echo EF 20% severe RV dysfunction moderate pericardial effusion  - suspect ETOH related - cath 02/13/20 with minimal CAD and massively volume overload - Milrinone added 1/29. On 0.125.  Co-ox 43-> 53%->63% today  - Excellent diuresis on lasix 80 IV tid. Remains fluid overloaded. CVP 13-14 - Continue IV lasix today  - Continue digoxin, spiro 25, Entresto 24/26 - Monitor K  - Hold b-blocker for now with low output - add corlanor 5 mg bid  - needs to stop ETOH - needs outpatient sleep study. +/- cMRI  2. Moderate pericardial effusion - no tamponade - continue diuresis  - will follow    3. ETOH abuse - counseled on cessation - CIWA protocol per primary team   Length of Stay: [address] Sharol Harness, PA-C  02/16/2020, 7:51 AM  Advanced Heart Failure Team Pager [phone] (M-F; 7a - 4p)  Please contact [facility] for night-coverage after hours (4p -7a ) and weekends on [email or web address]  Patient seen and examined with the above-signed Advanced Practice Provider and/or Housestaff. I personally reviewed  laboratory data, imaging studies and relevant notes.  I independently examined the patient and formulated the important aspects of the plan. I have edited the note to reflect any of my changes or salient points. I have personally discussed the plan with the patient and/or family.  Continues to diurese well with IV lasix and milrinone. Weight down nearly 40 pounds. Co-ox ok. CVP still up.  Renal function stable.  Co-ox 63%  General:  Sitting up in bed. No resp difficulty HEENT: normal Neck: supple. JVP to jaw. . Carotids 2+ bilat; no bruits. No lymphadenopathy or thryomegaly appreciated. Cor: PMI nondisplaced. Regular tachy + s3 Lungs: clear Abdomen: obese soft, nontender, nondistended. No hepatosplenomegaly. No bruits or masses. Good bowel sounds. Extremities: no cyanosis, clubbing, rash, 1-2+ edema Neuro: alert & orientedx3, cranial nerves grossly intact. moves all 4 extremities w/o difficulty. Affect pleasant  Continue milrinone and IV diuresis at least 1-2 more days then will wean milrinone. Careful addition of ivabradine. Supp K   Arvilla Meres, MD  8:56 AM

## 2020-02-16 NOTE — TOC Benefit Eligibility Note (Signed)
Transition of Care [facility]) Benefit Eligibility Note    Patient Details  Name: Craig Hanson MRN: [id number] Date of Birth: November 19, 1970   Medication/Dose: Corlanor 5mg .po bid  Covered?: Yes  Tier:  (?)  Prescription Coverage [facility]: [facility]  Spoke with Person/Company/Phone Number:: Per [phone] [facility] [phone]  Co-Pay: $80.00  Prior Approval: No (PA required @800 [phone] )  Deductible:  (no deductible on this plan)       [phone] Phone Number: 02/16/2020, 5:22 PM

## 2020-02-16 NOTE — Telephone Encounter (Signed)
Pt is still hospitalized. Would like to see patient after his discharge to get the most appropriate dose of diuretic.

## 2020-02-17 ENCOUNTER — Inpatient Hospital Stay (HOSPITAL_COMMUNITY): Payer: Managed Care, Other (non HMO)

## 2020-02-17 ENCOUNTER — Telehealth (HOSPITAL_COMMUNITY): Payer: Self-pay | Admitting: Pharmacy Technician

## 2020-02-17 DIAGNOSIS — I5041 Acute combined systolic (congestive) and diastolic (congestive) heart failure: Secondary | ICD-10-CM | POA: Diagnosis not present

## 2020-02-17 DIAGNOSIS — E8779 Other fluid overload: Secondary | ICD-10-CM | POA: Diagnosis not present

## 2020-02-17 DIAGNOSIS — I429 Cardiomyopathy, unspecified: Secondary | ICD-10-CM

## 2020-02-17 DIAGNOSIS — I428 Other cardiomyopathies: Secondary | ICD-10-CM | POA: Diagnosis not present

## 2020-02-17 LAB — BASIC METABOLIC PANEL
Anion gap: 8 (ref 5–15)
BUN: 9 mg/dL (ref 6–20)
CO2: 31 mmol/L (ref 22–32)
Calcium: 8.2 mg/dL — ABNORMAL LOW (ref 8.9–10.3)
Chloride: 102 mmol/L (ref 98–111)
Creatinine, Ser: 1.03 mg/dL (ref 0.61–1.24)
GFR, Estimated: >60 mL/min (ref >60)
Glucose, Bld: 94 mg/dL (ref 70–99)
Potassium: 4.2 mmol/L (ref 3.5–5.1)
Sodium: 141 mmol/L (ref 135–145)

## 2020-02-17 LAB — COOXEMETRY PANEL
Carboxyhemoglobin: 1 % (ref 0.5–1.5)
Carboxyhemoglobin: 1.2 % (ref 0.5–1.5)
Methemoglobin: 0.8 % (ref 0.0–1.5)
Methemoglobin: 0.6 % (ref 0.0–1.5)
O2 Saturation: 44 %
O2 Saturation: 67.2 %
Total hemoglobin: 14.7 g/dL (ref 12.0–16.0)
Total hemoglobin: 15 g/dL (ref 12.0–16.0)

## 2020-02-17 MED ORDER — GADOBUTROL 1 MMOL/ML IV SOLN
10.0000 mL | Freq: Once | INTRAVENOUS | Status: AC | PRN
  Administered 2020-02-17: 10 mL via INTRAVENOUS

## 2020-02-17 NOTE — Progress Notes (Signed)
STAT Co-OX ordered - however patient in cardiac MRI, will obtain when patient returns.

## 2020-02-17 NOTE — Plan of Care (Signed)

## 2020-02-17 NOTE — Progress Notes (Signed)
Milrinone stopped earlier this morning.  Repeat CO-OX 67%. Stable.   Repeat CO-OX in the morning.   Kamare Caspers NP-C  11:36 AM

## 2020-02-17 NOTE — Telephone Encounter (Signed)
Patient Advocate Encounter   Received notification from [employer] that prior authorization for Sherryll Burger is required.   PA submitted on CoverMyMeds Key [id number] Status is pending   Will continue to follow.  Current 30 day Corlanor co-pay is $80.

## 2020-02-17 NOTE — Progress Notes (Addendum)
Advanced Heart Failure Team Rounding Note   Primary Physician: Heather Roberts, NP PCP-Cardiologist:  Nona Dell, MD  Reason for Consultation: Acute systolic HF  Subjective:   Milrinone added 1/29 for co-ox 43%. Up to 63% yesterday on 0.125 mcg/kg/min. Pending today  Continues to diurese. Feels great. Ivabradine added for sinus tach yesterday  Rates improving 100-115    Objective:    Vital Signs:   Temp:  [97.3 F (36.3 C)-98.2 F (36.8 C)] 98 F (36.7 C) (02/01 0050) Pulse Rate:  [110-117] 114 (02/01 0050) Resp:  [16-18] 17 (02/01 0050) BP: (104-124)/(72-95) 111/82 (02/01 0050) SpO2:  [97 %-100 %] 97 % (02/01 0050) Weight:  [93.5 kg] 93.5 kg (01/31 0541) Last BM Date: 02/16/20  Weight change: Filed Weights   02/14/20 0352 02/15/20 0451 02/16/20 0541  Weight: 102.2 kg 99.4 kg 93.5 kg    Intake/Output:   Intake/Output Summary (Last 24 hours) at 02/17/2020 0410 Last data filed at 02/17/2020 0022 Gross per 24 hour  Intake 1450.16 ml  Output 2400 ml  Net -949.84 ml      Physical Exam    General:  Well appearing. No resp difficulty HEENT: normal Neck: supple. no JVD. Carotids 2+ bilat; no bruits. No lymphadenopathy or thryomegaly appreciated. Cor: PMI nondisplaced. Regular rate & rhythm. No rubs, gallops or murmurs. Lungs: clear Abdomen: soft, nontender, nondistended. No hepatosplenomegaly. No bruits or masses. Good bowel sounds. Extremities: no cyanosis, clubbing, rash, tr edema Neuro: alert & orientedx3, cranial nerves grossly intact. moves all 4 extremities w/o difficulty. Affect pleasant    Telemetry   Sinus tach 100-115   Personally reviewed  Labs   Basic Metabolic Panel: Recent Labs  Lab 02/10/20 0542 02/11/20 0786 02/12/20 0606 02/13/20 0419 02/13/20 1108 02/13/20 1111 02/13/20 1119 02/14/20 1421 02/15/20 0450 02/16/20 0553  NA 135   < > 136 137   < > 141 147* 137 137 136  K 3.7   < > 4.1 4.6   < > 4.0 3.0* 4.3 3.9 3.8  CL 97*    < > 99 98  --   --   --  99 98 95*  CO2 30   < > 29 28  --   --   --  28 31 29   GLUCOSE 133*   < > 113* 135*  --   --   --  162* 88 104*  BUN 15   < > 20 19  --   --   --  22* 18 13  CREATININE 0.81   < > 0.96 1.15  --   --   --  1.14 1.06 1.04  CALCIUM 8.6*   < > 8.9 9.1  --   --   --  8.7* 8.9 8.8*  MG 2.0  --  2.1  --   --   --   --  2.2  --   --    < > = values in this interval not displayed.    Liver Function Tests: No results for input(s): AST, ALT, ALKPHOS, BILITOT, PROT, ALBUMIN in the last 168 hours. No results for input(s): LIPASE, AMYLASE in the last 168 hours. No results for input(s): AMMONIA in the last 168 hours.  CBC: Recent Labs  Lab 02/13/20 0419 02/13/20 1108 02/13/20 1110 02/13/20 1111 02/13/20 1119  WBC 9.0  --   --   --   --   HGB 14.0 13.9 13.9 14.6 12.2*  HCT 43.4 41.0 41.0 43.0 36.0*  MCV 94.8  --   --   --   --   PLT 281  --   --   --   --     Cardiac Enzymes: No results for input(s): CKTOTAL, CKMB, CKMBINDEX, TROPONINI in the last 168 hours.  BNP: BNP (last 3 results) Recent Labs    02/06/20 1851  BNP 2,881.0*    ProBNP (last 3 results) No results for input(s): PROBNP in the last 8760 hours.   CBG: No results for input(s): GLUCAP in the last 168 hours.  Coagulation Studies: No results for input(s): LABPROT, INR in the last 72 hours.   Imaging   No results found.   Medications:     Current Medications: . Chlorhexidine Gluconate Cloth  6 each Topical Daily  . digoxin  0.125 mg Oral Daily  . enoxaparin (LOVENOX) injection  40 mg Subcutaneous Q24H  . folic acid  1 mg Oral Daily  . furosemide  80 mg Intravenous TID with meals  . ivabradine  5 mg Oral BID WC  . multivitamin with minerals  1 tablet Oral Daily  . potassium chloride  40 mEq Oral Daily  . sacubitril-valsartan  1 tablet Oral BID  . sodium chloride flush  3 mL Intravenous Q12H  . sodium chloride flush  3 mL Intravenous Q12H  . spironolactone  25 mg Oral Daily  .  thiamine  100 mg Oral Daily   Or  . thiamine  100 mg Intravenous Daily    Infusions: . sodium chloride 250 mL (02/16/20 0100)  . milrinone 0.125 mcg/kg/min (02/16/20 1121)      Assessment/Plan   1. Acute systolic HF with severe biventricular dysfunction due to NICM - echo EF 20% severe RV dysfunction moderate pericardial effusion  - suspect ETOH related - cath 02/13/20 with minimal CAD and massively volume overload - Milrinone added 1/29. On 0.125.  Co-ox 43-> 53%->63% -> pending  today  - Excellent diuresis on lasix 80 IV tid. - Volume status looks good. Will stop milrinone and IV lasix after this am lasix dose - Continue digoxin, spiro 25, Entresto 24/26 - Monitor K  - Hold b-blocker for now with low output - Continue corlanor 5 mg bid  - needs to stop ETOH - needs outpatient sleep study - will order cMRI - follow co-ox off milrinone.  2. Moderate pericardial effusion - no tamponade - continue diuresis  - will follow . Check cMRI today  3. ETOH abuse - counseled on cessation - CIWA protocol per primary team   Length of Stay: 11  Arvilla Meres, MD  02/17/2020, 4:10 AM  Advanced Heart Failure Team Pager [phone] (M-F; 7a - 4p)  Please contact [facility] for night-coverage after hours (4p -7a ) and weekends on [email or web address]

## 2020-02-17 NOTE — Progress Notes (Signed)
Co-ox 44% this am. Will repeat. May need to restart milrinone.

## 2020-02-17 NOTE — Progress Notes (Signed)
  PA started for enetresto.  Corlanor Co-pay 80.00 for 30 days.   Amy Clegg NP-C  11:19 AM

## 2020-02-18 DIAGNOSIS — I5041 Acute combined systolic (congestive) and diastolic (congestive) heart failure: Secondary | ICD-10-CM | POA: Diagnosis not present

## 2020-02-18 DIAGNOSIS — E8779 Other fluid overload: Secondary | ICD-10-CM | POA: Diagnosis not present

## 2020-02-18 DIAGNOSIS — I428 Other cardiomyopathies: Secondary | ICD-10-CM | POA: Diagnosis not present

## 2020-02-18 LAB — BASIC METABOLIC PANEL
Anion gap: 9 (ref 5–15)
BUN: 12 mg/dL (ref 6–20)
CO2: 29 mmol/L (ref 22–32)
Calcium: 8.8 mg/dL — ABNORMAL LOW (ref 8.9–10.3)
Chloride: 96 mmol/L — ABNORMAL LOW (ref 98–111)
Creatinine, Ser: 1.05 mg/dL (ref 0.61–1.24)
GFR, Estimated: >60 mL/min (ref >60)
Glucose, Bld: 94 mg/dL (ref 70–99)
Potassium: 4.3 mmol/L (ref 3.5–5.1)
Sodium: 134 mmol/L — ABNORMAL LOW (ref 135–145)

## 2020-02-18 LAB — COOXEMETRY PANEL
Carboxyhemoglobin: 1.2 % (ref 0.5–1.5)
Methemoglobin: 0.8 % (ref 0.0–1.5)
O2 Saturation: 63 %
Total hemoglobin: 14.7 g/dL (ref 12.0–16.0)

## 2020-02-18 MED ORDER — FUROSEMIDE 10 MG/ML IJ SOLN
80.0000 mg | Freq: Two times a day (BID) | INTRAMUSCULAR | Status: DC
  Administered 2020-02-18 – 2020-02-20 (×5): 80 mg via INTRAVENOUS
  Filled 2020-02-18 (×5): qty 8

## 2020-02-18 MED ORDER — FUROSEMIDE 10 MG/ML IJ SOLN
80.0000 mg | Freq: Two times a day (BID) | INTRAMUSCULAR | Status: DC
Start: 2020-02-18 — End: 2020-02-18

## 2020-02-18 NOTE — Progress Notes (Signed)
CARDIAC REHAB PHASE I   PRE:  Rate/Rhythm: 101 ST     In bathroom  MODE:  Ambulation: 750 ft   POST:  Rate/Rhythm: 116 ST  BP:  Supine:   Sitting: 108/76  Standing:    SaO2: 98%RA 1300-1355 Pt walked 750 ft on RA pushing IV pole with steady gait. Tolerated well. Stated it felt good to be up walking. Gave pt CHF booklet and discussed zones, when to call MD, daily weights, 2000 mg sodium restriction, 2L FR and walking instructions. Pt voiced understanding. Discussed CRP 2 and sent referral to [facility].  Gave low sodium diets. Pt stated dietitian had talked with his wife also regarding low sodium. Pt stated he has scales at home for daily weights.   Luetta Nutting, RN BSN  02/18/2020 1:50 PM

## 2020-02-18 NOTE — Progress Notes (Addendum)
Advanced Heart Failure Team Rounding Note   Primary Physician: Heather Roberts, NP PCP-Cardiologist:  Nona Dell, MD  Reason for Consultation: Acute systolic HF  Subjective:  Admit weight 244>195   Yesterday milrinone was stopped and diuresed with IV lasix. Todays CO-OX is 63%.   Feels good. Denies SOB.    Objective:    Vital Signs:   Temp:  [97.1 F (36.2 C)-98.3 F (36.8 C)] 97.9 F (36.6 C) (02/02 0447) Pulse Rate:  [96-112] 107 (02/02 0447) Resp:  [18-20] 18 (02/02 0447) BP: (106-115)/(77-91) 115/91 (02/02 0447) SpO2:  [96 %-98 %] 96 % (02/02 0447) Weight:  [88.9 kg] 88.9 kg (02/02 0447) Last BM Date: 02/17/20  Weight change: Filed Weights   02/16/20 0541 02/17/20 0546 02/18/20 0447  Weight: 93.5 kg 89.2 kg 88.9 kg    Intake/Output:   Intake/Output Summary (Last 24 hours) at 02/18/2020 1012 Last data filed at 02/17/2020 1658 Gross per 24 hour  Intake 480 ml  Output 2200 ml  Net -1720 ml      Physical Exam   CVP 11-12  General:  Sitting in the chair. No resp difficulty HEENT: normal Neck: supple. JVP 11-12 . Carotids 2+ bilat; no bruits. No lymphadenopathy or thryomegaly appreciated. Cor: PMI nondisplaced. Tachy regular. No rubs, gallops or murmurs. Lungs: clear Abdomen: soft, nontender, nondistended. No hepatosplenomegaly. No bruits or masses. Good bowel sounds. Extremities: no cyanosis, clubbing, rash, R and LLE 2 edema extending to thighs. RUE PICC  Neuro: alert & orientedx3, cranial nerves grossly intact. moves all 4 extremities w/o difficulty. Affect pleasant   Telemetry   Sinus Tach 100-110s  Labs   Basic Metabolic Panel: Recent Labs  Lab 02/12/20 0606 02/13/20 0419 02/14/20 1421 02/15/20 0450 02/16/20 0553 02/17/20 0543 02/18/20 0605  NA 136   < > 137 137 136 141 134*  K 4.1   < > 4.3 3.9 3.8 4.2 4.3  CL 99   < > 99 98 95* 102 96*  CO2 29   < > 28 31 29 31 29   GLUCOSE 113*   < > 162* 88 104* 94 94  BUN 20   < > 22* 18 13  9 12   CREATININE 0.96   < > 1.14 1.06 1.04 1.03 1.05  CALCIUM 8.9   < > 8.7* 8.9 8.8* 8.2* 8.8*  MG 2.1  --  2.2  --   --   --   --    < > = values in this interval not displayed.    Liver Function Tests: No results for input(s): AST, ALT, ALKPHOS, BILITOT, PROT, ALBUMIN in the last 168 hours. No results for input(s): LIPASE, AMYLASE in the last 168 hours. No results for input(s): AMMONIA in the last 168 hours.  CBC: Recent Labs  Lab 02/13/20 0419 02/13/20 1108 02/13/20 1110 02/13/20 1111 02/13/20 1119  WBC 9.0  --   --   --   --   HGB 14.0 13.9 13.9 14.6 12.2*  HCT 43.4 41.0 41.0 43.0 36.0*  MCV 94.8  --   --   --   --   PLT 281  --   --   --   --     Cardiac Enzymes: No results for input(s): CKTOTAL, CKMB, CKMBINDEX, TROPONINI in the last 168 hours.  BNP: BNP (last 3 results) Recent Labs    02/06/20 1851  BNP 2,881.0*    ProBNP (last 3 results) No results for input(s): PROBNP in the last 8760 hours.  CBG: No results for input(s): GLUCAP in the last 168 hours.  Coagulation Studies: No results for input(s): LABPROT, INR in the last 72 hours.   Imaging   No results found.   Medications:     Current Medications: . Chlorhexidine Gluconate Cloth  6 each Topical Daily  . digoxin  0.125 mg Oral Daily  . enoxaparin (LOVENOX) injection  40 mg Subcutaneous Q24H  . folic acid  1 mg Oral Daily  . ivabradine  5 mg Oral BID WC  . multivitamin with minerals  1 tablet Oral Daily  . sacubitril-valsartan  1 tablet Oral BID  . sodium chloride flush  3 mL Intravenous Q12H  . sodium chloride flush  3 mL Intravenous Q12H  . spironolactone  25 mg Oral Daily  . thiamine  100 mg Oral Daily   Or  . thiamine  100 mg Intravenous Daily    Infusions: . sodium chloride Stopped (02/17/20 0534)      Assessment/Plan   1. Acute systolic HF with severe biventricular dysfunction due to NICM - echo EF 20% severe RV dysfunction moderate pericardial effusion  - suspect  ETOH related stopped drinking 3 months ago.  - cath 02/13/20 with minimal CAD and massively volume overload - Milrinone added 1/29. On 0.125 and stopped on 02/17/20. CO-OX 63%   - CVP 11-12 Give 80 mg IV lasix twice today.  - Continue digoxin, spiro 25, Entresto 24/26 -  Continue corlanor 5 mg bid  - needs outpatient sleep study - CMRI- EF 17% RV  Severe dysfunction, ? Acute myocarditis possible sarcoid.  Wall thinning.   2. Moderate pericardial effusion - no tamponade - continue diuresis  - will follow.  - still with sizeable effusion on cMRI. No tamponade. Will need to follow  3. ETOH abuse - counseled on cessation  Needs to diurese more today.   Length of Stay: 12  Tonye Becket, NP  02/18/2020, 10:12 AM  Advanced Heart Failure Team Pager [phone] (M-F; 7a - 4p)  Please contact [facility] for night-coverage after hours (4p -7a ) and weekends on [email or web address]  Patient seen and examined with the above-signed Advanced Practice Provider and/or Housestaff. I personally reviewed laboratory data, imaging studies and relevant notes. I independently examined the patient and formulated the important aspects of the plan. I have edited the note to reflect any of my changes or salient points. I have personally discussed the plan with the patient and/or family.  Improving but remains tenuous.Weight down 49 pounds.  cMRI shows severe biventricular dysfunction and NICM. Moderate to large effusion. No tamponade. Remains tachy and volume overloaded.   General:  Well appearing. No resp difficulty HEENT: normal Neck: supple.JVP to ear Carotids 2+ bilat; no bruits. No lymphadenopathy or thryomegaly appreciated. Cor: PMI nondisplaced. Tachy regular No rubs, gallops or murmurs. Lungs: clear Abdomen: soft, nontender, nondistended. No hepatosplenomegaly. No bruits or masses. Good bowel sounds. Extremities: no cyanosis, clubbing, rash, 2+ edema Neuro: alert & orientedx3, cranial nerves grossly intact. moves  all 4 extremities w/o difficulty. Affect pleasant  Agree with plan for more diuresis. Titrate GDMT as tolerated. Watch pericardial effusion.   Arvilla Meres, MD  11:37 AM

## 2020-02-18 NOTE — Progress Notes (Signed)
Orthopedic Tech Progress Note Patient Details:  Craig Hanson November 13, 1970 [id number]  Ortho Devices Type of Ortho Device: Roland Rack boot Ortho Device/Splint Location: BLE Ortho Device/Splint Interventions: Ordered,Application   Post Interventions Patient Tolerated: Well Instructions Provided: Care of device   [name] 02/18/2020, 5:07 PM

## 2020-02-19 ENCOUNTER — Ambulatory Visit: Payer: Managed Care, Other (non HMO) | Admitting: Nurse Practitioner

## 2020-02-19 DIAGNOSIS — I5021 Acute systolic (congestive) heart failure: Secondary | ICD-10-CM | POA: Diagnosis not present

## 2020-02-19 LAB — CBC
HCT: 42.8 % (ref 39.0–52.0)
Hemoglobin: 13.9 g/dL (ref 13.0–17.0)
MCH: 30 pg (ref 26.0–34.0)
MCHC: 32.5 g/dL (ref 30.0–36.0)
MCV: 92.4 fL (ref 80.0–100.0)
Platelets: 261 10*3/uL (ref 150–400)
RBC: 4.63 MIL/uL (ref 4.22–5.81)
RDW: 16 % — ABNORMAL HIGH (ref 11.5–15.5)
WBC: 9 10*3/uL (ref 4.0–10.5)
nRBC: 0 % (ref 0.0–0.2)

## 2020-02-19 LAB — BASIC METABOLIC PANEL
Anion gap: 11 (ref 5–15)
BUN: 10 mg/dL (ref 6–20)
CO2: 28 mmol/L (ref 22–32)
Calcium: 8.5 mg/dL — ABNORMAL LOW (ref 8.9–10.3)
Chloride: 97 mmol/L — ABNORMAL LOW (ref 98–111)
Creatinine, Ser: 1.02 mg/dL (ref 0.61–1.24)
GFR, Estimated: >60 mL/min (ref >60)
Glucose, Bld: 98 mg/dL (ref 70–99)
Potassium: 3.7 mmol/L (ref 3.5–5.1)
Sodium: 136 mmol/L (ref 135–145)

## 2020-02-19 LAB — MAGNESIUM: Magnesium: 2.4 mg/dL (ref 1.7–2.4)

## 2020-02-19 LAB — COOXEMETRY PANEL
Carboxyhemoglobin: 1.2 % (ref 0.5–1.5)
Methemoglobin: 0.6 % (ref 0.0–1.5)
O2 Saturation: 68.1 %
Total hemoglobin: 14.5 g/dL (ref 12.0–16.0)

## 2020-02-19 NOTE — Progress Notes (Addendum)
Advanced Heart Failure Team Rounding Note   Primary Physician: Heather Roberts, NP PCP-Cardiologist:  Nona Dell, MD  Reason for Consultation: Acute systolic HF  Subjective:   Wt continues to trend down.   CVP 8-9 but still fluid overloaded.   Co-ox 68% off milrinone.   Feels well today. No dyspnea.   Brief NSVT/Vfib ~ midnight but asymptomatic.  K 3.7   cMRI shows severe biventricular dysfunction and NICM. Moderate to large effusion. No tamponade. BP normotensive.     Objective:    Vital Signs:   Temp:  [97.1 F (36.2 C)-98.9 F (37.2 C)] 98.9 F (37.2 C) (02/03 0504) Pulse Rate:  [99-105] 104 (02/03 0504) Resp:  [16-18] 18 (02/03 0504) BP: (94-122)/(72-88) 122/88 (02/03 0504) SpO2:  [96 %-99 %] 99 % (02/03 0504) Weight:  [85.4 kg] 85.4 kg (02/03 0504) Last BM Date: 02/17/20  Weight change: Filed Weights   02/17/20 0546 02/18/20 0447 02/19/20 0504  Weight: 89.2 kg 88.9 kg 85.4 kg    Intake/Output:   Intake/Output Summary (Last 24 hours) at 02/19/2020 0801 Last data filed at 02/19/2020 0500 Gross per 24 hour  Intake 360 ml  Output 2300 ml  Net -1940 ml      Physical Exam   CVP 8-9  General:  Well appearing AAM. No resp difficulty HEENT: normal Neck: supple. JVP ~10 cm . Carotids 2+ bilat; no bruits. No lymphadenopathy or thryomegaly appreciated. Cor: PMI nondisplaced. Regular rhythm, tachy rate. No rubs, gallops or murmurs. Lungs: clear bilaterally, no wheezing  Abdomen: soft, nontender, nondistended. No hepatosplenomegaly. No bruits or masses. Good bowel sounds. Extremities: no cyanosis, clubbing, rash, 1+ bilateral LEE up to thighs, + bilateral unna boots + RUE PICC  Neuro: alert & orientedx3, cranial nerves grossly intact. moves all 4 extremities w/o difficulty. Affect pleasant   Telemetry   Sinus Tach low 100s  Labs   Basic Metabolic Panel: Recent Labs  Lab 02/14/20 1421 02/15/20 0450 02/16/20 0553 02/17/20 0543 02/18/20 0605  02/19/20 0612  NA 137 137 136 141 134* 136  K 4.3 3.9 3.8 4.2 4.3 3.7  CL 99 98 95* 102 96* 97*  CO2 28 31 29 31 29 28   GLUCOSE 162* 88 104* 94 94 98  BUN 22* 18 13 9 12 10   CREATININE 1.14 1.06 1.04 1.03 1.05 1.02  CALCIUM 8.7* 8.9 8.8* 8.2* 8.8* 8.5*  MG 2.2  --   --   --   --   --     Liver Function Tests: No results for input(s): AST, ALT, ALKPHOS, BILITOT, PROT, ALBUMIN in the last 168 hours. No results for input(s): LIPASE, AMYLASE in the last 168 hours. No results for input(s): AMMONIA in the last 168 hours.  CBC: Recent Labs  Lab 02/13/20 0419 02/13/20 1108 02/13/20 1110 02/13/20 1111 02/13/20 1119 02/19/20 0500  WBC 9.0  --   --   --   --  9.0  HGB 14.0 13.9 13.9 14.6 12.2* 13.9  HCT 43.4 41.0 41.0 43.0 36.0* 42.8  MCV 94.8  --   --   --   --  92.4  PLT 281  --   --   --   --  261    Cardiac Enzymes: No results for input(s): CKTOTAL, CKMB, CKMBINDEX, TROPONINI in the last 168 hours.  BNP: BNP (last 3 results) Recent Labs    02/06/20 1851  BNP 2,881.0*    ProBNP (last 3 results) No results for input(s): PROBNP in the  last 8760 hours.   CBG: No results for input(s): GLUCAP in the last 168 hours.  Coagulation Studies: No results for input(s): LABPROT, INR in the last 72 hours.   Imaging   No results found.   Medications:     Current Medications: . Chlorhexidine Gluconate Cloth  6 each Topical Daily  . digoxin  0.125 mg Oral Daily  . enoxaparin (LOVENOX) injection  40 mg Subcutaneous Q24H  . folic acid  1 mg Oral Daily  . furosemide  80 mg Intravenous BID  . ivabradine  5 mg Oral BID WC  . multivitamin with minerals  1 tablet Oral Daily  . sacubitril-valsartan  1 tablet Oral BID  . sodium chloride flush  3 mL Intravenous Q12H  . sodium chloride flush  3 mL Intravenous Q12H  . spironolactone  25 mg Oral Daily  . thiamine  100 mg Oral Daily   Or  . thiamine  100 mg Intravenous Daily    Infusions: . sodium chloride Stopped (02/17/20  0534)      Assessment/Plan   1. Acute systolic HF with severe biventricular dysfunction due to NICM - echo EF 20% severe RV dysfunction moderate pericardial effusion  - suspect ETOH related stopped drinking 3 months ago.  - cath 02/13/20 with minimal CAD and massively volume overload - Milrinone added 1/29 and stopped on 02/17/20. CO-OX 68%   - CVP 8-9, still edematous. Continue IV Lasix 80 mg bid   - Continue digoxin, spiro 25, Entresto 24/26 -  Continue corlanor 5 mg bid   - needs outpatient sleep study - CMRI- EF 17% RV  Severe dysfunction, ? Acute myocarditis possible sarcoid.  Wall thinning.   - monitor on tele. If frequent NSVT, may need LifeVest at d/c. Check Mg level   2. Moderate-Large pericardial effusion - no tamponade - continue diuresis  - will follow.  - still with sizeable effusion on cMRI. No tamponade. Will need to follow  3. ETOH abuse - counseled on cessation   Length of Stay: [address], PA-C  02/19/2020, 8:01 AM  Advanced Heart Failure Team Pager [phone] (M-F; 7a - 4p)  Please contact [facility] for night-coverage after hours (4p -7a ) and weekends on [email or web address]  Patient seen and examined with the above-signed Advanced Practice Provider and/or Housestaff. I personally reviewed laboratory data, imaging studies and relevant notes. I independently examined the patient and formulated the important aspects of the plan. I have edited the note to reflect any of my changes or salient points. I have personally discussed the plan with the patient and/or family.  Diuresing well on IV lasix. CVP 8-9. Co-ox stable off milrinone.    General:  Well appearing. No resp difficulty HEENT: normal Neck: supple. JVP 8-9. Carotids 2+ bilat; no bruits. No lymphadenopathy or thryomegaly appreciated. Cor: PMI nondisplaced. Regular rate & rhythm. No rubs, gallops or murmurs. Lungs: clear Abdomen: obese soft, nontender, nondistended. No hepatosplenomegaly. No bruits  or masses. Good bowel sounds. Extremities: no cyanosis, clubbing, rash, 1+ edema Neuro: alert & orientedx3, cranial nerves grossly intact. moves all 4 extremities w/o difficulty. Affect pleasant  Continue IV lasix one more day. Watch electrolytes and renal function. SBP soft will not increase Entresto today.    Arvilla Meres, MD  12:11 PM

## 2020-02-19 NOTE — Progress Notes (Signed)
CARDIAC REHAB PHASE I   PRE:  Rate/Rhythm: 106 ST      MODE:  Ambulation: 750 ft   POST:  Rate/Rhythm: 111 ST  BP:  Supine:   Sitting: 119/75  Standing:    SaO2: 99%RA 0839-0900 Pt stated he walked independently yesterday( after I walked with him ) several times in hallway. No DOE noted with our walks. He is tolerating well. Walked 750 ft independently pushing IV pole. Gait steady. Stated he is ready to make changes especially with diet. No questions re ed done yesterday.   Luetta Nutting, RN BSN  02/19/2020 9:16 AM

## 2020-02-20 LAB — BASIC METABOLIC PANEL
Anion gap: 9 (ref 5–15)
BUN: 11 mg/dL (ref 6–20)
CO2: 29 mmol/L (ref 22–32)
Calcium: 8.7 mg/dL — ABNORMAL LOW (ref 8.9–10.3)
Chloride: 97 mmol/L — ABNORMAL LOW (ref 98–111)
Creatinine, Ser: 0.98 mg/dL (ref 0.61–1.24)
GFR, Estimated: >60 mL/min (ref >60)
Glucose, Bld: 227 mg/dL — ABNORMAL HIGH (ref 70–99)
Potassium: 3.9 mmol/L (ref 3.5–5.1)
Sodium: 135 mmol/L (ref 135–145)

## 2020-02-20 LAB — COOXEMETRY PANEL
Carboxyhemoglobin: 0.8 % (ref 0.5–1.5)
Carboxyhemoglobin: 1 % (ref 0.5–1.5)
Methemoglobin: 0.8 % (ref 0.0–1.5)
Methemoglobin: 0.7 % (ref 0.0–1.5)
O2 Saturation: 42 %
O2 Saturation: 63.1 %
Total hemoglobin: 15.7 g/dL (ref 12.0–16.0)
Total hemoglobin: 15.8 g/dL (ref 12.0–16.0)

## 2020-02-20 LAB — CREATININE, SERUM
Creatinine, Ser: 0.95 mg/dL (ref 0.61–1.24)
GFR, Estimated: >60 mL/min (ref >60)

## 2020-02-20 MED ORDER — SACUBITRIL-VALSARTAN 24-26 MG PO TABS: 1.0000 | ORAL_TABLET | Freq: Two times a day (BID) | ORAL | 5 refills | Status: DC

## 2020-02-20 MED ORDER — POTASSIUM CHLORIDE ER 20 MEQ PO TBCR: 20.0000 meq | EXTENDED_RELEASE_TABLET | Freq: Every day | ORAL | 5 refills | Status: DC

## 2020-02-20 MED ORDER — SPIRONOLACTONE 25 MG PO TABS: 25.0000 mg | ORAL_TABLET | Freq: Every day | ORAL | 5 refills | Status: DC

## 2020-02-20 MED ORDER — FUROSEMIDE 40 MG PO TABS: 40.0000 mg | ORAL_TABLET | Freq: Every day | ORAL | 5 refills | Status: DC

## 2020-02-20 MED ORDER — DAPAGLIFLOZIN PROPANEDIOL 10 MG PO TABS: 10.0000 mg | ORAL_TABLET | Freq: Every day | ORAL | 5 refills | Status: DC

## 2020-02-20 MED ORDER — IVABRADINE HCL 5 MG PO TABS: 5.0000 mg | ORAL_TABLET | Freq: Two times a day (BID) | ORAL | 5 refills | Status: DC

## 2020-02-20 MED ORDER — DIGOXIN 125 MCG PO TABS: 0.1250 mg | ORAL_TABLET | Freq: Every day | ORAL | 5 refills | Status: DC

## 2020-02-20 MED FILL — FARXIGA 10 MG TABLET: 10 | 30 days supply | Qty: 30 | Fill #0

## 2020-02-20 MED FILL — SPIRONOLACTONE 25 MG TABLET: 25 | 30 days supply | Qty: 30 | Fill #0

## 2020-02-20 MED FILL — FUROSEMIDE 40 MG TABLET: 40 | 30 days supply | Qty: 30 | Fill #0

## 2020-02-20 MED FILL — CORLANOR 5 MG TABLET: 5 | 30 days supply | Qty: 60 | Fill #0

## 2020-02-20 MED FILL — POTASSIUM CHLORIDE 20meqER: 20 | 30 days supply | Qty: 30 | Fill #0

## 2020-02-20 MED FILL — DIGOXIN 0.125 MG TABLET: 125 | 30 days supply | Qty: 30 | Fill #0

## 2020-02-20 MED FILL — ENTRESTO 24 MG-26 MG TABLET: 24-26 | 30 days supply | Qty: 60 | Fill #0

## 2020-02-20 NOTE — Discharge Summary (Addendum)
Advanced Heart Failure Team  Discharge Summary   Patient ID: Craig Hanson MRN: [id number], DOB/AGE: 07-09-1970 50 y.o. Admit date: 02/06/2020 D/C date:     02/20/2020   Primary Discharge Diagnoses:  Acute Biventricular Heart Failure/ NICM Mod-Large Pericardial Effusion w/o Tamponade  H/o ETOH Abuse   Hospital Course:   50 y.o.malewith a hx of EtOH abuse who initially presented to [facility] w/ CC of SOB and edema and found to be in acute biventricular heart failure w/ large pericardial effusion on echo w/o tamponade. LVEF 20-25%. RV moderately reduced.   Transferred to [facility] for further management and R/LHC which showed minimal nonobstructive CAD, severe NICM EF <20% and markedly elevated filling pressures w/ preserved CO. PICC line placed to follow CVPs and Co-ox. Started on IV Lasix. 1/29, co-ox dropped to 43% and milrinone added. Had good diuresis w/ IV Lasix. Diuresed ~60 lb. Able to wean off milrinone w/ stable co-ox 63%. Started on GDMT and tolerated well. cMRI showed severe biventricular dysfunction c/w NICM.  Last seen and examined by Dr. Gala Romney on 2/4 and felt stable for d/c home. Post hospital f/u in AHF clinic arranged for 02/25/20. Will need outpatient sleep study.     Discharge Weight Range: 183 lb  Discharge Vitals: Blood pressure 99/65, pulse 94, temperature 97.9 F (36.6 C), temperature source Axillary, resp. rate 18, height 5\' 8"  (1.727 m), weight 83.1 kg, SpO2 100 %.  Labs: Lab Results  Component Value Date   WBC 9.0 02/19/2020   HGB 13.9 02/19/2020   HCT 42.8 02/19/2020   MCV 92.4 02/19/2020   PLT 261 02/19/2020    Recent Labs  Lab 02/20/20 0915  NA 135  K 3.9  CL 97*  CO2 29  BUN 11  CREATININE 0.98  CALCIUM 8.7*  GLUCOSE 227*   Lab Results  Component Value Date   CHOL 150 01/22/2020   HDL 38 (L) 01/22/2020   LDLCALC 96 01/22/2020   TRIG 84 01/22/2020   BNP (last 3 results) Recent Labs    02/06/20 1851  BNP 2,881.0*    ProBNP (last 3  results) No results for input(s): PROBNP in the last 8760 hours.   Diagnostic Studies/Procedures   No results found.  Discharge Medications   Allergies as of 02/20/2020   No Known Allergies     Medication List    STOP taking these medications   potassium chloride SA 20 MEQ tablet Commonly known as: KLOR-CON     TAKE these medications   dapagliflozin propanediol 10 MG Tabs tablet Commonly known as: FARXIGA Take 1 tablet (10 mg total) by mouth daily before breakfast.   digoxin 0.125 MG tablet Commonly known as: LANOXIN Take 1 tablet (0.125 mg total) by mouth daily. Start taking on: February 21, 2020   furosemide 40 MG tablet Commonly known as: LASIX Take 1 tablet (40 mg total) by mouth daily. What changed: when to take this   ivabradine 5 MG Tabs tablet Commonly known as: CORLANOR Take 1 tablet (5 mg total) by mouth 2 (two) times daily with a meal.   Potassium Chloride ER 20 MEQ Tbcr Take 20 mEq by mouth daily.   sacubitril-valsartan 24-26 MG Commonly known as: ENTRESTO Take 1 tablet by mouth 2 (two) times daily.   spironolactone 25 MG tablet Commonly known as: ALDACTONE Take 1 tablet (25 mg total) by mouth daily. Start taking on: February 21, 2020       Disposition   The patient will be discharged in stable condition  to home. Discharge Instructions    Amb Referral to Cardiac Rehabilitation   Complete by: As directed    Diagnosis: Heart Failure (see criteria below if ordering Phase II)   Heart Failure Type: Chronic Systolic   After initial evaluation and assessments completed: Virtual Based Care may be provided alone or in conjunction with Phase 2 Cardiac Rehab based on patient barriers.: Yes      Follow-up Information    [facility] Follow up on 02/25/2020.   Specialty: Cardiology Why: at [facility] located on the 1st floor of [place]. Entrance [address]. Garage Code 1212.  Contact information: [address] [id number] [place] [address] [phone]                Duration of Discharge Encounter: Greater than 35 minutes   Signed, Knute Neu  02/20/2020, 4:31 PM   Patient seen and examined with the above-signed Advanced Practice Provider and/or Housestaff. I personally reviewed laboratory data, imaging studies and relevant notes. I independently examined the patient and formulated the important aspects of the plan. I have edited the note to reflect any of my changes or salient points. I have personally discussed the plan with the patient and/or family.   He is ready for d/c. Meds reviewed with PharmD. F/u HF Clinic next week.   Arvilla Meres, MD  4:58 PM

## 2020-02-20 NOTE — Progress Notes (Signed)
CARDIAC REHAB PHASE I   PRE:  Rate/Rhythm: 110 ST  BP:  Supine:   Sitting: 123/84  Standing:    SaO2: 99%RA  MODE:  Ambulation: 1300 ft   POST:  Rate/Rhythm: 118 ST  BP:  Supine:   Sitting: 116/79  Standing:    SaO2: 100%RA 0950-1015 Pt walked 1300 ft on RA with steady gait and no SOB. Tolerated well. Has been walking independently. Knows to continue to walk on unit several times a day.   Luetta Nutting, RN BSN  02/20/2020 10:11 AM

## 2020-02-20 NOTE — Telephone Encounter (Signed)
Advanced Heart Failure Patient Advocate Encounter  Prior Authorization for Sherryll Burger has been approved.    Effective dates: 02/19/20 through 02/19/23  Patients co-pay is $80 (30 day supply)  Archer Asa, CPhT

## 2020-02-20 NOTE — Progress Notes (Addendum)
Advanced Heart Failure Team Rounding Note   Primary Physician: Heather Roberts, NP PCP-Cardiologist:  Nona Dell, MD  Reason for Consultation: Acute systolic HF  Subjective:   Co-ox 63% off milrinone.   -2.4L out yesterday. Wt down another 5 lb.  SCr 0.98  CVP 5-6.   Feels great. Ambulated today w/o exertional dyspnea.    cMRI shows severe biventricular dysfunction and NICM. Moderate to large effusion. No tamponade. BP normotensive.     Objective:    Vital Signs:   Temp:  [98.1 F (36.7 C)-98.5 F (36.9 C)] 98.2 F (36.8 C) (02/04 0824) Pulse Rate:  [98-103] 99 (02/04 0824) Resp:  [18-20] 18 (02/04 0824) BP: (105-115)/(76-82) 115/80 (02/04 0824) SpO2:  [97 %-100 %] 97 % (02/04 0824) Weight:  [83.1 kg] 83.1 kg (02/04 0353) Last BM Date: 02/17/20  Weight change: Filed Weights   02/18/20 0447 02/19/20 0504 02/20/20 0353  Weight: 88.9 kg 85.4 kg 83.1 kg    Intake/Output:   Intake/Output Summary (Last 24 hours) at 02/20/2020 1044 Last data filed at 02/20/2020 0925 Gross per 24 hour  Intake 850 ml  Output 2400 ml  Net -1550 ml      Physical Exam   CVP 5-6  General:  Well appearing. No respiratory difficulty HEENT: normal Neck: supple. no JVD. Carotids 2+ bilat; no bruits. No lymphadenopathy or thyromegaly appreciated. Cor: PMI nondisplaced. Regular rhythm, tach rate. No rubs, gallops or murmurs. Lungs: clear Abdomen: soft, nontender, nondistended. No hepatosplenomegaly. No bruits or masses. Good bowel sounds. Extremities: no cyanosis, clubbing, rash, edema Neuro: alert & oriented x 3, cranial nerves grossly intact. moves all 4 extremities w/o difficulty. Affect pleasant.    Telemetry   Sinus Tach low 100s  Labs   Basic Metabolic Panel: Recent Labs  Lab 02/14/20 1421 02/15/20 0450 02/16/20 0553 02/17/20 0543 02/18/20 0605 02/19/20 0612 02/19/20 1007 02/20/20 0558 02/20/20 0915  NA 137   < > 136 141 134* 136  --   --  135  K 4.3   <  > 3.8 4.2 4.3 3.7  --   --  3.9  CL 99   < > 95* 102 96* 97*  --   --  97*  CO2 28   < > 29 31 29 28   --   --  29  GLUCOSE 162*   < > 104* 94 94 98  --   --  227*  BUN 22*   < > 13 9 12 10   --   --  11  CREATININE 1.14   < > 1.04 1.03 1.05 1.02  --  0.95 0.98  CALCIUM 8.7*   < > 8.8* 8.2* 8.8* 8.5*  --   --  8.7*  MG 2.2  --   --   --   --   --  2.4  --   --    < > = values in this interval not displayed.    Liver Function Tests: No results for input(s): AST, ALT, ALKPHOS, BILITOT, PROT, ALBUMIN in the last 168 hours. No results for input(s): LIPASE, AMYLASE in the last 168 hours. No results for input(s): AMMONIA in the last 168 hours.  CBC: Recent Labs  Lab 02/13/20 1108 02/13/20 1110 02/13/20 1111 02/13/20 1119 02/19/20 0500  WBC  --   --   --   --  9.0  HGB 13.9 13.9 14.6 12.2* 13.9  HCT 41.0 41.0 43.0 36.0* 42.8  MCV  --   --   --   --  92.4  PLT  --   --   --   --  261    Cardiac Enzymes: No results for input(s): CKTOTAL, CKMB, CKMBINDEX, TROPONINI in the last 168 hours.  BNP: BNP (last 3 results) Recent Labs    02/06/20 1851  BNP 2,881.0*    ProBNP (last 3 results) No results for input(s): PROBNP in the last 8760 hours.   CBG: No results for input(s): GLUCAP in the last 168 hours.  Coagulation Studies: No results for input(s): LABPROT, INR in the last 72 hours.   Imaging   No results found.   Medications:     Current Medications: . Chlorhexidine Gluconate Cloth  6 each Topical Daily  . digoxin  0.125 mg Oral Daily  . enoxaparin (LOVENOX) injection  40 mg Subcutaneous Q24H  . folic acid  1 mg Oral Daily  . furosemide  80 mg Intravenous BID  . ivabradine  5 mg Oral BID WC  . multivitamin with minerals  1 tablet Oral Daily  . sacubitril-valsartan  1 tablet Oral BID  . sodium chloride flush  3 mL Intravenous Q12H  . sodium chloride flush  3 mL Intravenous Q12H  . spironolactone  25 mg Oral Daily  . thiamine  100 mg Oral Daily   Or  .  thiamine  100 mg Intravenous Daily    Infusions: . sodium chloride Stopped (02/17/20 0534)      Assessment/Plan   1. Acute systolic HF with severe biventricular dysfunction due to NICM - echo EF 20% severe RV dysfunction moderate pericardial effusion  - suspect ETOH related stopped drinking 3 months ago.  - cath 02/13/20 with minimal CAD and massively volume overload - Milrinone added 1/29 and stopped on 02/17/20. CO-OX 63%   - CVP 5-6. Stop IV Lasix. Transition to PO 40 mg daily   - Continue digoxin, spiro 25, Entresto 24/26 (BP too soft to titrate).  - Start Farxiga 10 mg daily  - Continue corlanor 5 mg bid   - needs outpatient sleep study - CMRI- EF 17% RV  Severe dysfunction, ? Acute myocarditis possible sarcoid.  Wall thinning.     2. Moderate-Large pericardial effusion - no tamponade - continue diuresis  - will follow.  - still with sizeable effusion on cMRI. No tamponade. Will need to follow as outpatient   3. ETOH abuse - counseled on cessation  Plan d/c home. Will arrange hospital f/u in [facility].    Length of Stay: [address] PA-C  02/20/2020, 10:44 AM  Advanced Heart Failure Team Pager [phone] (M-F; 7a - 4p)  Please contact [facility] for night-coverage after hours (4p -7a ) and weekends on [email or web address]  Patient seen and examined with the above-signed Advanced Practice Provider and/or Housestaff. I personally reviewed laboratory data, imaging studies and relevant notes. I independently examined the patient and formulated the important aspects of the plan. I have edited the note to reflect any of my changes or salient points. I have personally discussed the plan with the patient and/or family.  Feels much better. Weight down 61 pounds. Co-ox 63% HR improved  General:  Well appearing. No resp difficulty HEENT: normal Neck: supple. no JVD. Carotids 2+ bilat; no bruits. No lymphadenopathy or thryomegaly appreciated. Cor: PMI nondisplaced. Regular rate  & rhythm. No rubs, gallops or murmurs. Lungs: clear Abdomen: soft, nontender, nondistended. No hepatosplenomegaly. No bruits or masses. Good bowel sounds. Extremities: no cyanosis, clubbing, rash, edema Neuro: alert & orientedx3, cranial nerves grossly intact. moves  all 4 extremities w/o difficulty. Affect pleasant  He is ready for d/c. Meds reviewed with PharmD. F/u HF Clinic next week.   Arvilla Meres, MD  4:56 PM

## 2020-02-24 NOTE — Progress Notes (Signed)
Advanced Heart Failure Clinic Note   PCP: Bjorn Pippin, NP Primary Cardilogist: Dr. Diona Browner HF Cardiologist: Dr. Gala Romney  HPI: 50 y.o.malewith a hx of EtOH abuse who initially presented to [facility] w/ CC of SOB and edema and found to be in acute biventricular heart failure w/ large pericardial effusion on echo w/o tamponade. LVEF 20-25%. RV moderately reduced.   Transferred to [facility] for further management and R/LHC which showed minimal nonobstructive CAD, severe NICM EF <20% and markedly elevated filling pressures w/ preserved CO. PICC line placed to follow CVPs and Co-ox. Started on IV Lasix. 1/29, co-ox dropped to 43% and milrinone added. Had good diuresis w/ IV Lasix. Diuresed ~60 lb. Able to wean off milrinone w/ stable co-ox 63%. Started on GDMT and tolerated well. cMRI showed severe biventricular dysfunction c/w NICM. Discharge weight 183 lbs.  Today he returns for post-hospital follow up. Overall feeling fine. Gets SOB with stairs, but overall feels breathing is better. Denies increasing SOB, CP, dizziness, edema, or PND/Orthopnea. Appetite ok. No fever or chills. Weight at home ~182-183 pounds. Taking all medications. Stopped drinking ETOH in November. Just started [employer]  Cardiac Studies: cMRI (2/22): severe biventricular dysfunction and NICM. Moderate to large effusion. No tamponade.  R/LHC 02/13/20: Ost LAD to Prox LAD lesion is 20% stenosed, Ost Cx to Prox Cx lesion is 20% stenosed. Findings:  Ao = 110/84 (95) LV = 106/33 RA =  20 RV = 59/29 PA = 62/20 (43) PCW = 38 Fick cardiac output/index = 5.1/2.3 SVR = 1186 PVR = 1.0 WU FA sat = 96% PA sat = 64%, 68% SVC sat = 66% PAPi = 2.1   Assessment: 1. Minimal nonobstructive CAD 2. 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 Markedly elevated filling pressures with preserved cardiac output   Echo (02/07/20): EF 20-25%, global hypokinesis, grade III dd, severe RV dysfunction, mod LAE, moderate pericardial effusion  ROS: All systems  negative except as listed in HPI, PMH and Problem List.  SH:  Social History   Socioeconomic History  . Marital status: Married    Spouse name: Not on file  . Number of children: Not on file  . Years of education: Not on file  . 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 Smoking status: Never Smoker  . Smokeless tobacco: Never Used  Vaping Use  . Vaping Use: Never used  Substance and Sexual Activity  . Alcohol use: Yes    Comment: 1 gallon of vodka lasts 2 weeks  . Drug use: Never  . Sexual activity: Not on file  Other Topics Concern  . Not on file  Social History Narrative  . Not on file   Social Determinants of Health   Financial Resource Strain: Not on file  Food Insecurity: Not on file  Transportation Needs: Not on file  Physical Activity: Not on file  Stress: Not on file  Social Connections: Not on file  Intimate Partner Violence: Not on file   FH:  Family History  Problem Relation Age of Onset  . Hypertension Mother   . Heart disease Father     Past Medical History:  Diagnosis Date  . Asthma   . Pericardial effusion   . Systolic HF (heart failure) (HCC)     Current Outpatient Medications  Medication Sig Dispense Refill  . dapagliflozin propanediol (FARXIGA) 10 MG TABS tablet Take 1 tablet (10 mg total) by mouth daily  before breakfast. 30 tablet 5  . digoxin (LANOXIN) 0.125 MG tablet Take 1 tablet (0.125 mg total) by mouth daily. 30 tablet 5  . furosemide (LASIX) 40 MG tablet Take 1 tablet (40 mg total) by mouth daily. 30 tablet 5  . potassium chloride 20 MEQ TBCR Take 20 mEq by mouth daily. 30 tablet 5  . sacubitril-valsartan (ENTRESTO) 49-51 MG Take 1 tablet by mouth 2 (two) times daily. 60 tablet 6  . spironolactone (ALDACTONE) 25 MG tablet Take 1 tablet (25 mg total) by mouth daily. 30 tablet 5  . ivabradine (CORLANOR) 5 MG TABS tablet Take 1.5 tablets (7.5 mg total)  by mouth 2 (two) times daily with a meal. 90 tablet 5   No current facility-administered medications for this encounter.    Vitals:   02/25/20 1056  BP: 131/89  Pulse: (!) 115  SpO2: 100%  Weight: 87.7 kg (193 lb 6.4 oz)    PHYSICAL EXAM: General:  Well appearing. No resp difficulty HEENT: normal, hoarse voice Neck: supple. no JVD. Carotids 2+ bilat; no bruits. No lymphadenopathy or thryomegaly appreciated. Cor: PMI nondisplaced. Tachy rate & rhythm. No rubs, gallops or murmurs. Lungs: clear, diminished in bases Abdomen: Soft, nontender, nondistended. No hepatosplenomegaly. No bruits or masses. Good bowel sounds. Extremities: no cyanosis, clubbing, rash, edema Neuro: alert & orientedx3, cranial nerves grossly intact. moves all 4 extremities w/o difficulty. Affect pleasant  ECG: ST, RBBB, qrs 124 ms, 117 bpm (personally reviewed).  ASSESSMENT & PLAN: 1. Chronic systolic HF with severe biventricular dysfunction due to NICM - Echo EF 20% severe RV dysfunction moderate pericardial effusion, suspect ETOH related stopped drinking 3 months ago.  - Cath 02/13/20 with minimal CAD and massively volume overload. - cMRI EF 17% RV Severe dysfunction, ? Acute myocarditis possible sarcoid. Wall thinning.   - NYHA II, volume good today. Weight up some from d/c - Increase Entresto 49/51 mg bid. - Increase Corlanor to 7.5 mg bid. HR 117 today. Cautioned patient to avoid caffeine.  - Continue lasix 40 mg daily. May need to back off on dose with increase in [place]. - Continue digoxin 0.125 mg daily.  - Continue spiro 25 mg daily  - Continue Farxiga 10 mg daily. - No beta blocker yet with recent shock & low EF. - Will arrange for sleep study - BMET, Dig level, repeat BMET 7-10 days. - Repeat echo when GDMT optimized, and to re-assess pericardial effusion.  2. Tachycardia - Increase Corlanor as above. - Avoid caffeine.  3. Moderate-Large pericardial effusion - No tamponade.  - Sizeable  effusion on cMRI. No tamponade.  - Will need to follow in outpatient.   4. ETOH abuse - Congratulated on abstinence. - Last drink was in November, starting AA meetings today. - Encouraged to reach out to HFSW if needing additional support.  Follow up in 3 weeks with PharmD for further medication titration and in 6 weeks with APP.   Greater than 50% of the (total minutes 25) visit spent in counseling/coordination of care regarding recommendations for the management of chronic heart failure to control volume/symptoms and reduce risk of acute exacerbation that may necessitate hospitalization.  These measures include continuation of current diuretics with close outpatient monitoring of volume status through daily weights.  Patient advised to check weight daily and to call our office if greater than 3 pound weight gain in 24 hours or greater than 5 pound weight gain in the course of 1 week. Patient also strongly encouraged to adhere to a  low salt diet, reducing intake to less than 2 g daily.  Prince Rome, FNP-BC 02/25/20

## 2020-02-25 ENCOUNTER — Ambulatory Visit (HOSPITAL_COMMUNITY)
Admit: 2020-02-25 | Discharge: 2020-02-25 | Disposition: A | Discharge status: Home / Self Care | Payer: Managed Care, Other (non HMO) | Attending: Family Medicine | Admitting: Family Medicine

## 2020-02-25 ENCOUNTER — Other Ambulatory Visit: Payer: Self-pay

## 2020-02-25 ENCOUNTER — Encounter (HOSPITAL_COMMUNITY): Payer: Self-pay

## 2020-02-25 VITALS — BP 131/89 | HR 115 | Wt 193.4 lb

## 2020-02-25 DIAGNOSIS — I2510 Atherosclerotic heart disease of native coronary artery without angina pectoris: Secondary | ICD-10-CM | POA: Diagnosis not present

## 2020-02-25 DIAGNOSIS — I313 Pericardial effusion (noninflammatory): Secondary | ICD-10-CM | POA: Diagnosis not present

## 2020-02-25 DIAGNOSIS — R000 Tachycardia, unspecified: Secondary | ICD-10-CM | POA: Diagnosis not present

## 2020-02-25 DIAGNOSIS — F1010 Alcohol abuse, uncomplicated: Secondary | ICD-10-CM

## 2020-02-25 DIAGNOSIS — J45909 Unspecified asthma, uncomplicated: Secondary | ICD-10-CM | POA: Diagnosis not present

## 2020-02-25 DIAGNOSIS — I3139 Other pericardial effusion (noninflammatory): Secondary | ICD-10-CM

## 2020-02-25 DIAGNOSIS — I5022 Chronic systolic (congestive) heart failure: Secondary | ICD-10-CM

## 2020-02-25 DIAGNOSIS — Z79899 Other long term (current) drug therapy: Secondary | ICD-10-CM | POA: Insufficient documentation

## 2020-02-25 LAB — BASIC METABOLIC PANEL
Anion gap: 11 (ref 5–15)
BUN: 9 mg/dL (ref 6–20)
CO2: 27 mmol/L (ref 22–32)
Calcium: 9.3 mg/dL (ref 8.9–10.3)
Chloride: 99 mmol/L (ref 98–111)
Creatinine, Ser: 1.02 mg/dL (ref 0.61–1.24)
GFR, Estimated: >60 mL/min (ref >60)
Glucose, Bld: 118 mg/dL — ABNORMAL HIGH (ref 70–99)
Potassium: 5.1 mmol/L (ref 3.5–5.1)
Sodium: 137 mmol/L (ref 135–145)

## 2020-02-25 LAB — DIGOXIN LEVEL: Digoxin Level: 0.6 ng/mL — ABNORMAL LOW (ref 0.8–2.0)

## 2020-02-25 MED ORDER — IVABRADINE HCL 5 MG PO TABS: 7.5000 mg | ORAL_TABLET | Freq: Two times a day (BID) | ORAL | 5 refills | Status: DC

## 2020-02-25 MED ORDER — ENTRESTO 49-51 MG PO TABS: 1.0000 | ORAL_TABLET | Freq: Two times a day (BID) | ORAL | 6 refills | Status: DC

## 2020-02-25 NOTE — Patient Instructions (Signed)
INCREASE Entresto to 49/51 mg, one tab twice a day INCREASE Corlanor to 7.5 mg (one and one half tab) twice a day  Labs today We will only contact you if something comes back abnormal or we need to make some changes. Otherwise no news is good news!  Labs needed in 7-10 days  Your physician recommends that you schedule a follow-up appointment in: 3 weeks with the pharmacy team and 6 weeks  in the Advanced Practitioners (PA/NP) Clinic    Do the following things EVERYDAY: 1) Weigh yourself in the morning before breakfast. Write it down and keep it in a log. 2) Take your medicines as prescribed 3) Eat low salt foods-Limit salt (sodium) to 2000 mg per day.  4) Stay as active as you can everyday 5) Limit all fluids for the day to less than 2 liters  At the Advanced Heart Failure Clinic, you and your health needs are our priority. As part of our continuing mission to provide you with exceptional heart care, we have created designated Provider Care Teams. These Care Teams include your primary Cardiologist (physician) and Advanced Practice Providers (APPs- Physician Assistants and Nurse Practitioners) who all work together to provide you with the care you need, when you need it.   You may see any of the following providers on your designated Care Team at your next follow up: Marland Kitchen Dr Arvilla Meres . Dr Marca Ancona . Tonye Becket, NP . Robbie Lis, PA . Shanda Bumps [place],NP . Karle Plumber, PharmD   Please be sure to bring in all your medications bottles to every appointment.     If you have any questions or concerns before your next appointment please send [place] a message through [place] or call our office at [phone].    TO LEAVE A MESSAGE FOR THE NURSE SELECT OPTION 2, PLEASE LEAVE A MESSAGE INCLUDING: . YOUR NAME . DATE OF BIRTH . CALL BACK NUMBER . REASON FOR CALL**this is important as we prioritize the call backs  YOU WILL RECEIVE A CALL BACK THE SAME DAY AS LONG AS YOU CALL  BEFORE 4:00 PM

## 2020-02-26 ENCOUNTER — Encounter (HOSPITAL_COMMUNITY): Payer: Self-pay

## 2020-02-26 NOTE — Progress Notes (Signed)
Records request from [facility] received. Last OV and labs faxed to [phone]. Request sent to be scanned into chart

## 2020-03-01 ENCOUNTER — Ambulatory Visit: Payer: Managed Care, Other (non HMO) | Admitting: Nurse Practitioner

## 2020-03-01 ENCOUNTER — Encounter: Payer: Self-pay | Admitting: Nurse Practitioner

## 2020-03-01 ENCOUNTER — Encounter (HOSPITAL_COMMUNITY): Payer: Managed Care, Other (non HMO)

## 2020-03-01 ENCOUNTER — Other Ambulatory Visit: Payer: Self-pay

## 2020-03-01 DIAGNOSIS — I5022 Chronic systolic (congestive) heart failure: Secondary | ICD-10-CM | POA: Diagnosis not present

## 2020-03-01 NOTE — Assessment & Plan Note (Addendum)
-  discharge weight was 183, and his weights at hime have been around 185 without clothing on -weight was 195 today with clothes on -he knows to avoid salt and is on 2g Na diet  -2L fluid restriction -will start paperwork for his job/disability

## 2020-03-01 NOTE — Progress Notes (Signed)
Established Patient Office Visit  Subjective:  Patient ID: Craig Hanson, male    DOB: 1970-11-18  Age: 50 y.o. MRN: [id number]  CC:  Chief Complaint  Patient presents with  . Follow-up    Hospitalization for edema. Pt feeling much better.     HPI Craig Hanson presents for hospital follow-up.  He is followed by Dr. Diona Browner as well as Dr. Gala Romney, with the advanced heart failure clinic.  Hospital course: 50 y.o.malewith a hx of EtOH abusewho initially presented to [facility] w/ CC of SOB and edema and found to be in acute biventricular heart failure w/ large pericardial effusion on echo w/o tamponade. LVEF 20-25%. RV moderately reduced.   Transferred to [facility] for further management and R/LHC which showed minimal nonobstructive CAD, severe NICM EF <20% and markedly elevated filling pressures w/ preserved CO. PICC line placed to follow CVPs and Co-ox. Started on IV Lasix. 1/29, co-ox dropped to 43% and milrinone added. Had good diuresis w/ IV Lasix. Diuresed ~60 lb. Able to wean off milrinone w/ stable co-ox 63%. Started on GDMT and tolerated well. cMRI showedsevere biventricular dysfunctionc/wNICM. Discharge weight 183 lbs.  HF clinic f/u: Returned for post-hospital follow up. Overall feeling fine. Gets SOB with stairs, but overall feels breathing is better. Denies increasing SOB, CP, dizziness, edema, or PND/Orthopnea. Appetite ok. No fever or chills. Weight at home ~182-183 pounds. Taking all medications. Stopped drinking ETOH in November. Just started [employer]  Past Medical History:  Diagnosis Date  . Acute CHF (congestive heart failure) (HCC) 02/06/2020  . Asthma   . Pericardial effusion   . Systolic HF (heart failure) [facility])     Past Surgical History:  Procedure Laterality Date  . No prior surgery    . RIGHT/LEFT HEART CATH AND CORONARY ANGIOGRAPHY N/A 02/13/2020   Procedure: RIGHT/LEFT HEART CATH AND CORONARY ANGIOGRAPHY;  Surgeon: Dolores Patty, MD;   Location: [facility] INVASIVE CV LAB;  Service: Cardiovascular;  Laterality: N/A;    Family History  Problem Relation Age of Onset  . Hypertension Mother   . Heart disease Father     Social History   Socioeconomic History  . Marital status: Married    Spouse name: Not on file  . Number of children: Not on file  . Years of education: Not on file  . 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 Smoking status: Never Smoker  . Smokeless tobacco: Never Used  Vaping Use  . Vaping Use: Never used  Substance and Sexual Activity  . Alcohol use: Yes    Comment: 1 gallon of vodka lasts 2 weeks  . Drug use: Never  . Sexual activity: Not on file  Other Topics Concern  . Not on file  Social History Narrative  . Not on file   Social Determinants of Health   Financial Resource Strain: Not on file  Food Insecurity: Not on file  Transportation Needs: Not on file  Physical Activity: Not on file  Stress: Not on file  Social Connections: Not on file  Intimate Partner Violence: Not on file    Outpatient Medications Prior to Visit  Medication Sig Dispense Refill  . dapagliflozin propanediol (FARXIGA) 10 MG TABS tablet Take 1 tablet (10 mg total) by mouth daily before breakfast. 30 tablet 5  . digoxin (LANOXIN) 0.125 MG tablet Take 1 tablet (0.125 mg total) by mouth daily. 30 tablet 5  .  furosemide (LASIX) 40 MG tablet Take 1 tablet (40 mg total) by mouth daily. 30 tablet 5  . ivabradine (CORLANOR) 5 MG TABS tablet Take 1.5 tablets (7.5 mg total) by mouth 2 (two) times daily with a meal. 90 tablet 5  . potassium chloride 20 MEQ TBCR Take 20 mEq by mouth daily. 30 tablet 5  . sacubitril-valsartan (ENTRESTO) 49-51 MG Take 1 tablet by mouth 2 (two) times daily. 60 tablet 6  . spironolactone (ALDACTONE) 25 MG tablet Take 1 tablet (25 mg total) by mouth daily. 30 tablet 5   No facility-administered medications prior to  visit.    No Known Allergies  ROS Review of Systems  Constitutional: Positive for fatigue.       Fatigue is improving  Respiratory: Negative.   Cardiovascular: Positive for leg swelling. Negative for chest pain and palpitations.       Much improved from previous  Psychiatric/Behavioral: Negative.       Objective:    Physical Exam Constitutional:      Appearance: Normal appearance.  Cardiovascular:     Rate and Rhythm: Normal rate and regular rhythm.     Pulses: Normal pulses.     Heart sounds: Normal heart sounds.     Comments: 1+ pitting edema Pulmonary:     Effort: Pulmonary effort is normal.     Breath sounds: Normal breath sounds.  Neurological:     Mental Status: He is alert.     BP 111/70   Pulse 86   Temp 97.9 F (36.6 C)   Resp 20   Ht 5\' 8"  (1.727 m)   Wt 195 lb (88.5 kg)   SpO2 96%   BMI 29.65 kg/m  Wt Readings from Last 3 Encounters:  03/01/20 195 lb (88.5 kg)  02/25/20 193 lb 6.4 oz (87.7 kg)  02/20/20 183 lb 4.8 oz (83.1 kg)     Health Maintenance Due  Topic Date Due  . COLONOSCOPY (Pts 45-54yrs Insurance coverage will need to be confirmed)  Never done  . INFLUENZA VACCINE  Never done    There are no preventive care reminders to display for this patient.  Lab Results  Component Value Date   TSH 3.364 02/10/2020   Lab Results  Component Value Date   WBC 9.0 02/19/2020   HGB 13.9 02/19/2020   HCT 42.8 02/19/2020   MCV 92.4 02/19/2020   PLT 261 02/19/2020   Lab Results  Component Value Date   NA 137 02/25/2020   K 5.1 02/25/2020   CO2 27 02/25/2020   GLUCOSE 118 (H) 02/25/2020   BUN 9 02/25/2020   CREATININE 1.02 02/25/2020   BILITOT 1.3 (H) 02/08/2020   ALKPHOS 120 02/08/2020   AST 48 (H) 02/08/2020   ALT 43 02/08/2020   PROT 6.2 (L) 02/08/2020   ALBUMIN 2.9 (L) 02/08/2020   CALCIUM 9.3 02/25/2020   ANIONGAP 11 02/25/2020   Lab Results  Component Value Date   CHOL 150 01/22/2020   Lab Results  Component Value Date    HDL 38 (L) 01/22/2020   Lab Results  Component Value Date   LDLCALC 96 01/22/2020   Lab Results  Component Value Date   TRIG 84 01/22/2020   No results found for: CHOLHDL No results found for: 03/21/2020    Assessment & Plan:   Problem List Items Addressed This Visit      Cardiovascular and Mediastinum   Chronic systolic heart failure (HCC)    -discharge weight was 183, and his  weights at hime have been around 185 without clothing on -weight was 195 today with clothes on -he knows to avoid salt and is on 2g Na diet  -2L fluid restriction -will start paperwork for his job/disability         No orders of the defined types were placed in this encounter.   Follow-up: Return in about 3 months (around 05/29/2020) for Lab follow-up.    Heather Roberts, NP

## 2020-03-03 ENCOUNTER — Telehealth (HOSPITAL_COMMUNITY): Payer: Self-pay | Admitting: Cardiology

## 2020-03-03 ENCOUNTER — Other Ambulatory Visit: Payer: Self-pay

## 2020-03-03 ENCOUNTER — Ambulatory Visit (HOSPITAL_COMMUNITY)
Admission: RE | Admit: 2020-03-03 | Discharge: 2020-03-03 | Disposition: A | Discharge status: Home / Self Care | Payer: Managed Care, Other (non HMO) | Source: Ambulatory Visit | Attending: Internal Medicine | Admitting: Internal Medicine

## 2020-03-03 DIAGNOSIS — I5022 Chronic systolic (congestive) heart failure: Secondary | ICD-10-CM | POA: Diagnosis not present

## 2020-03-03 LAB — BASIC METABOLIC PANEL
Anion gap: 8 (ref 5–15)
BUN: 8 mg/dL (ref 6–20)
CO2: 29 mmol/L (ref 22–32)
Calcium: 9.3 mg/dL (ref 8.9–10.3)
Chloride: 103 mmol/L (ref 98–111)
Creatinine, Ser: 0.92 mg/dL (ref 0.61–1.24)
GFR, Estimated: >60 mL/min (ref >60)
Glucose, Bld: 106 mg/dL — ABNORMAL HIGH (ref 70–99)
Potassium: 5.2 mmol/L — ABNORMAL HIGH (ref 3.5–5.1)
Sodium: 140 mmol/L (ref 135–145)

## 2020-03-03 NOTE — Telephone Encounter (Signed)
[phone] (H) pt aware via wife Repeat labs 3/3

## 2020-03-03 NOTE — Telephone Encounter (Signed)
-----   Message from Jacklynn Ganong, [place] sent at 03/03/2020 12:13 PM EST ----- Potassium elevated. Is he taking a KCl supplement? If so, stop, and repeat bmet in 1-2 weeks.

## 2020-03-12 ENCOUNTER — Telehealth: Payer: Self-pay

## 2020-03-12 NOTE — Telephone Encounter (Signed)
Noted Copied Sleeved  

## 2020-03-12 NOTE — Progress Notes (Signed)
PCP: Bjorn Pippin, NP Primary Cardilogist: Dr. Diona Browner HF Cardiologist: Dr. Gala Romney  HPI:  50 y.o.malewith a hx of EtOH abusewho initially presented to [facility] w/ CC of SOB and edema and found to be in acute biventricular heart failure w/ large pericardial effusion on echo w/o tamponade. LVEF 20-25%. RV moderately reduced.   Transferred to [facility] for further management and R/LHC which showed minimal nonobstructive CAD, severe NICM EF <20% and markedly elevated filling pressures w/ preserved CO. PICC line placed to follow CVPs and Co-ox. Started on IV furosemide. 1/29, co-ox dropped to 43% and milrinone added. Had good diuresis w/ IV furosemide. Diuresed ~60 lbs. Able to wean off milrinone w/ stable co-ox 63%. Started on GDMT and tolerated well. cMRI showedsevere biventricular dysfunctionc/wNICM. Discharge weight 183 lbs.  Recently returned to HF Clinic for follow up on 02/25/20 with [facility]. Overall was feeling fine. Reported getting SOB with stairs, but overall felt breathing was better. Denied increasing SOB, CP, dizziness, edema, or PND/Orthopnea. Appetite was ok. No fever or chills. Weight at home was ~182-183 pounds. Reported taking all medications. Stopped drinking ETOH in November. Just started [employer]  Today he returns to HF clinic for pharmacist medication titration. At last visit with APP, Entresto was increased to 49/51 mg BID and Corlanor was increased to 7.5 mg BID. Although he was able to increase Entresto, he never increased the Corlanor because he didn't realize the pharmacy was still filling the old prescription. Overall he is doing well today. Says he feels great. No dizziness, lightheadedness, chest pain or palpitations. No SOB/DOE. He takes furosemide 40 mg daily and has not needed any extra. Weights at home have been stable. No LEE, PND or orthopnea.    HF Medications: Entresto 49/51 mg BID Spironolactone 25 mg daily Farxiga 10 mg daily  Digoxin 0.125 mg  daily Corlanor 7.5 mg BID - only taking Corlanor 5 mg BID Furosemide 40 mg daily  Has the patient been experiencing any side effects to the medications prescribed?  no  Does the patient have any problems obtaining medications due to transportation or finances?   Yes - Entresto and Corlanor were each $80 when last filled. He has [employer] so we are able to use copay card. He already has activated copay cards for both medications. Clinic patient advocate will call [facility] and provide them with the copay card billing information so his medication cost will be reduced.   Understanding of regimen: good Understanding of indications: good Potential of compliance: good Patient understands to avoid NSAIDs. Patient understands to avoid decongestants.    Pertinent Lab Values: . Serum creatinine 1.01, BUN 5, Potassium 4.6, Sodium 140  Vital Signs: . Weight: 193.2 lbs (last clinic weight: 195 lbs) . Blood pressure: 120/78  . Heart rate: 98   Assessment/Plan: 1. Chronic systolic HF with severe biventricular dysfunction due to NICM - Echo EF 20% severe RV dysfunction moderate pericardial effusion, suspect ETOH related stopped drinking 3 months ago.  - Cath 02/13/20 with minimal CAD and massively volume overload. - cMRI EF 17% RV Severe dysfunction, ? Acute myocarditis possible sarcoid. Wall thinning.  - NYHA II, euvolemic on exam - Continue furosemide 40 mg daily - Increase Entresto to 97/103mg  BID. Repeat BMET in 2 weeks.  - Continue spironolactone 25 mg daily  - Continue Farxiga 10 mg daily. - Increase Corlanor to 7.5 mg BID   - Continue digoxin 0.125 mg daily.  - No beta blocker yet with recent shock & low EF. -  Repeat echo when GDMT optimized, and to re-assess pericardial effusion. - Follow up in HF Clinic in 2 weeks with APP Clinic.   2. Tachycardia - Increase Corlanor as above. - Avoid caffeine.  3. Moderate-Large pericardial effusion - No tamponade.  - Sizeable  effusion on cMRI. No tamponade.  - Will need to follow in outpatient.   4. ETOH abuse - Congratulated on abstinence. - Last drink was in November, started AA meetings mid-February 2022. - Encouraged to reach out to HFSW if needing additional support.    Karle Plumber, PharmD, BCPS, BCCP, CPP Heart Failure Clinic Pharmacist [phone]

## 2020-03-18 ENCOUNTER — Ambulatory Visit (HOSPITAL_COMMUNITY)
Admission: RE | Admit: 2020-03-18 | Discharge: 2020-03-18 | Disposition: A | Discharge status: Home / Self Care | Payer: Managed Care, Other (non HMO) | Source: Ambulatory Visit | Attending: Internal Medicine | Admitting: Internal Medicine

## 2020-03-18 ENCOUNTER — Other Ambulatory Visit: Payer: Self-pay

## 2020-03-18 DIAGNOSIS — I5022 Chronic systolic (congestive) heart failure: Secondary | ICD-10-CM | POA: Insufficient documentation

## 2020-03-18 LAB — BASIC METABOLIC PANEL
Anion gap: 10 (ref 5–15)
BUN: 5 mg/dL — ABNORMAL LOW (ref 6–20)
CO2: 28 mmol/L (ref 22–32)
Calcium: 9.6 mg/dL (ref 8.9–10.3)
Chloride: 102 mmol/L (ref 98–111)
Creatinine, Ser: 1.01 mg/dL (ref 0.61–1.24)
GFR, Estimated: >60 mL/min (ref >60)
Glucose, Bld: 106 mg/dL — ABNORMAL HIGH (ref 70–99)
Potassium: 4.6 mmol/L (ref 3.5–5.1)
Sodium: 140 mmol/L (ref 135–145)

## 2020-03-23 ENCOUNTER — Telehealth (HOSPITAL_COMMUNITY): Payer: Self-pay | Admitting: Pharmacy Technician

## 2020-03-23 ENCOUNTER — Other Ambulatory Visit: Payer: Self-pay

## 2020-03-23 ENCOUNTER — Ambulatory Visit (HOSPITAL_COMMUNITY)
Admission: RE | Admit: 2020-03-23 | Discharge: 2020-03-23 | Disposition: A | Discharge status: Home / Self Care | Payer: Managed Care, Other (non HMO) | Source: Ambulatory Visit | Attending: Cardiology | Admitting: Cardiology

## 2020-03-23 VITALS — BP 120/78 | HR 98 | Wt 193.2 lb

## 2020-03-23 DIAGNOSIS — F1011 Alcohol abuse, in remission: Secondary | ICD-10-CM | POA: Diagnosis not present

## 2020-03-23 DIAGNOSIS — I428 Other cardiomyopathies: Secondary | ICD-10-CM | POA: Insufficient documentation

## 2020-03-23 DIAGNOSIS — I313 Pericardial effusion (noninflammatory): Secondary | ICD-10-CM | POA: Insufficient documentation

## 2020-03-23 DIAGNOSIS — I2510 Atherosclerotic heart disease of native coronary artery without angina pectoris: Secondary | ICD-10-CM | POA: Insufficient documentation

## 2020-03-23 DIAGNOSIS — I5022 Chronic systolic (congestive) heart failure: Secondary | ICD-10-CM | POA: Insufficient documentation

## 2020-03-23 DIAGNOSIS — R000 Tachycardia, unspecified: Secondary | ICD-10-CM | POA: Insufficient documentation

## 2020-03-23 DIAGNOSIS — Z79899 Other long term (current) drug therapy: Secondary | ICD-10-CM | POA: Insufficient documentation

## 2020-03-23 DIAGNOSIS — I5082 Biventricular heart failure: Secondary | ICD-10-CM | POA: Insufficient documentation

## 2020-03-23 MED ORDER — SACUBITRIL-VALSARTAN 97-103 MG PO TABS: 1.0000 | ORAL_TABLET | Freq: Two times a day (BID) | ORAL | 3 refills | Status: DC

## 2020-03-23 MED ORDER — IVABRADINE HCL 7.5 MG PO TABS: 7.5000 mg | ORAL_TABLET | Freq: Two times a day (BID) | ORAL | 11 refills | Status: DC

## 2020-03-23 NOTE — Patient Instructions (Addendum)
It was a pleasure seeing you today!  MEDICATIONS: -We are changing your medications today -Increase Entresto to 97/103 mg (1 tablet) twice daily. You may take 2 tablets of the 49/51 mg strength twice daily until you receive the new strength.   --Increase Corlanor to 7.5 mg (1 tablet) twice daily. You may take 1.5 tablets of the 5 mg strength twice daily until you receive the new strength.  -Call if you have questions about your medications.   NEXT APPOINTMENT: Return to clinic in 2 weeks with APP Clinic.  In general, to take care of your heart failure: -Limit your fluid intake to 2 Liters (half-gallon) per day.   -Limit your salt intake to ideally 2-3 grams (2000-3000 mg) per day. -Weigh yourself daily and record, and bring that "weight diary" to your next appointment.  (Weight gain of 2-3 pounds in 1 day typically means fluid weight.) -The medications for your heart are to help your heart and help you live longer.   -Please contact us before stopping any of your heart medications.  Call the clinic at [phone] with questions or to reschedule future appointments.

## 2020-03-23 NOTE — Telephone Encounter (Signed)
Patient was seen in clinic today and stated that he could not afford Entresto or Corlanor.   Was able to activate a $10 Entresto co-pay card on his behalf.  BIN: [id number] PCN: [id number] ID: [id number] Group: [id number]  Patient had a $20 Corlanor co-pay card used at our [facility] pharmacy.  BIN: [id number] PCN [id number] Group [id number]

## 2020-03-25 DIAGNOSIS — R609 Edema, unspecified: Secondary | ICD-10-CM

## 2020-03-25 DIAGNOSIS — I5022 Chronic systolic (congestive) heart failure: Secondary | ICD-10-CM

## 2020-04-07 NOTE — Progress Notes (Signed)
Advanced Heart Failure Clinic Note    PCP: Bjorn Pippin, NP Primary Cardilogist: Dr. Diona Browner HF Cardiologist: Dr. Gala Romney  HPI: Craig Hanson is a 50 y.o.malewith a hx of EtOH abuse who initially presented to [facility] w/ CC of SOB and edema and found to be in acute biventricular heart failure w/ large pericardial effusion on echo w/o tamponade. LVEF 20-25%. RV moderately reduced.   Transferred to [facility] for further management and R/LHC which showed minimal nonobstructive CAD, severe NICM EF <20% and markedly elevated filling pressures w/ preserved CO. PICC line placed to follow CVPs and Co-ox. Started on IV Lasix. 1/29, co-ox dropped to 43% and milrinone added. Had good diuresis w/ IV Lasix. Diuresed ~60 lb. Able to wean off milrinone w/ stable co-ox 63%. Started on GDMT and tolerated well. cMRI showed severe biventricular dysfunction c/w NICM. Discharge weight 183 lbs.  Saw HF Pharmacist on 03/23/20. Entresto was increased to 97-103 mg twice a day and corlanor was increased to 7.5 mg twice a day. He started higher dose of entresto on 3/21 and took both doses and had some dizziness. He took one more dose on 3/22 but then stopped due to dizziness.     Today he returns for HF follow up.Overall feeling fine. Denies SOB/PND/Orthopnea. Able to walk up steps without difficulty. Appetite ok. No fever or chills. Weight at home 188-189  pounds. Taking all medications.  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   Cardiac Studies: cMRI (2/22): severe biventricular dysfunction and NICM. Moderate to large effusion. No tamponade.  R/LHC 02/13/20: Ost LAD to Prox LAD lesion is 20% stenosed, Ost Cx to Prox Cx lesion is 20% stenosed. Findings: Ao = 110/84 (95) LV = 106/33 RA =  20 RV = 59/29 PA = 62/20 (43) PCW = 38 Fick cardiac output/index = 5.1/2.3 SVR = 1186 PVR = 1.0 WU FA sat = 96% PA sat = 64%, 68% SVC sat = 66% PAPi = 2.1  Assessment: 1. Minimal nonobstructive CAD 2.  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 Markedly elevated filling pressures with preserved cardiac output   Echo (02/07/20): EF 20-25%, global hypokinesis, grade III dd, severe RV dysfunction, mod LAE, moderate pericardial effusion  ROS: All systems negative except as listed in HPI, PMH and Problem List.  SH:  Social History   Socioeconomic History  . Marital status: Married    Spouse name: Not on file  . Number of children: Not on file  . Years of education: Not on file  . 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 Smoking status: Never Smoker  . Smokeless tobacco: Never Used  Vaping Use  . Vaping Use: Never used  Substance and Sexual Activity  . Alcohol use: Yes    Comment: 1 gallon of vodka lasts 2 weeks  . Drug use: Never  . Sexual activity: Not on file  Other Topics Concern  . Not on file  Social History Narrative  . Not on file   Social Determinants of Health   Financial Resource Strain: Not on file  Food Insecurity: Not on file  Transportation Needs: Not on file  Physical Activity: Not on file  Stress: Not on file  Social Connections: Not on file  Intimate Partner Violence: Not on file   FH:  Family History  Problem Relation Age of Onset  . Hypertension Mother   . Heart disease Father  Past Medical History:  Diagnosis Date  . Acute CHF (congestive heart failure) (HCC) 02/06/2020  . Asthma   . Pericardial effusion   . Systolic HF (heart failure) (HCC)     Current Outpatient Medications  Medication Sig Dispense Refill  . dapagliflozin propanediol (FARXIGA) 10 MG TABS tablet Take 1 tablet (10 mg total) by mouth daily before breakfast. 30 tablet 5  . digoxin (LANOXIN) 0.125 MG tablet Take 1 tablet (0.125 mg total) by mouth daily. 30 tablet 5  . furosemide (LASIX) 40 MG tablet Take 1 tablet (40 mg total) by mouth daily. 30 tablet 5  . ivabradine (CORLANOR) 7.5 MG TABS tablet Take 1  tablet (7.5 mg total) by mouth 2 (two) times daily with a meal. 60 tablet 11  . sacubitril-valsartan (ENTRESTO) 97-103 MG Take 1 tablet by mouth 2 (two) times daily. 180 tablet 3  . spironolactone (ALDACTONE) 25 MG tablet Take 1 tablet (25 mg total) by mouth daily. 30 tablet 5   No current facility-administered medications for this encounter.    Vitals:   04/08/20 0955  BP: (!) 142/100  Pulse: 94  SpO2: 99%  Weight: 89.1 kg (196 lb 6.4 oz)   Reds Clip 42%  PHYSICAL EXAM: General:  Well appearing. No resp difficulty HEENT: normal Neck: supple. no JVD. Carotids 2+ bilat; no bruits. No lymphadenopathy or thryomegaly appreciated. Cor: PMI nondisplaced. Regular rate & rhythm. No rubs, gallops or murmurs. Lungs: clear Abdomen: soft, nontender, nondistended. No hepatosplenomegaly. No bruits or masses. Good bowel sounds. Extremities: no cyanosis, clubbing, rash, edema Neuro: alert & orientedx3, cranial nerves grossly intact. moves all 4 extremities w/o difficulty. Affect pleasant    ASSESSMENT & PLAN: 1. Chronic systolic HF with severe biventricular dysfunction due to NICM - Echo EF 20% severe RV dysfunction moderate pericardial effusion, suspect ETOH related stopped drinking 3 months ago.  - Cath 02/13/20 with minimal CAD and massively volume overload. - cMRI EF 17% RV Severe dysfunction, ? Acute myocarditis possible sarcoid. Wall thinning.   - NYHA I. Functional improvement.  Reds Clip 42% but on exam he does not appear volume overloaded.  - Change lasix to as needed for weight 193 or greater.  - Restart entresto 97-103 mg twice a day. Asked him to start tonight.  - Start coreg 3.125 mg twice a day.   -Continue spironolactone 25 mg daily -Continue corlanor to 7.5 mg bid.  We have asked Pharmacy Tech to see if he has any patient assistance for this. Hopefully we can increase bb if needed.  - Continue digoxin 0.125 mg daily. Recent level stable.  - Continue spiro 25 mg daily  -  Continue Farxiga 10 mg daily. - Repeat echo in 2 months when GDMT optimized, and to re-assess pericardial effusion.  2. Tachycardia -Improved.   3. Moderate-Large pericardial effusion - No tamponade.  - Sizeable effusion on cMRI. No tamponade.  - Repeat ECHO in 2 months.   4. ETOH abuse - Rarely drinking alcohol.   5. HTN  -Elevated. See above. Restarting entresto and adding coreg.   Follow up 3-4 weeks with APP Follow up in 8 weeks with Dr Leory Plowman and an ECHO.  Discussed medication changes and purpose. Discussed need for repeat ECHO.    Craig Obando NP-C   04/08/20

## 2020-04-08 ENCOUNTER — Ambulatory Visit (HOSPITAL_COMMUNITY)
Admission: RE | Admit: 2020-04-08 | Discharge: 2020-04-08 | Disposition: A | Discharge status: Home / Self Care | Payer: Managed Care, Other (non HMO) | Source: Ambulatory Visit | Attending: Adult Health | Admitting: Adult Health

## 2020-04-08 ENCOUNTER — Other Ambulatory Visit: Payer: Self-pay

## 2020-04-08 ENCOUNTER — Encounter (HOSPITAL_COMMUNITY): Payer: Self-pay

## 2020-04-08 VITALS — BP 142/100 | HR 94 | Wt 196.4 lb

## 2020-04-08 DIAGNOSIS — I428 Other cardiomyopathies: Secondary | ICD-10-CM | POA: Diagnosis not present

## 2020-04-08 DIAGNOSIS — I110 Hypertensive heart disease with heart failure: Secondary | ICD-10-CM | POA: Insufficient documentation

## 2020-04-08 DIAGNOSIS — I5022 Chronic systolic (congestive) heart failure: Secondary | ICD-10-CM | POA: Insufficient documentation

## 2020-04-08 DIAGNOSIS — I313 Pericardial effusion (noninflammatory): Secondary | ICD-10-CM | POA: Insufficient documentation

## 2020-04-08 DIAGNOSIS — F1010 Alcohol abuse, uncomplicated: Secondary | ICD-10-CM | POA: Diagnosis not present

## 2020-04-08 DIAGNOSIS — Z8249 Family history of ischemic heart disease and other diseases of the circulatory system: Secondary | ICD-10-CM | POA: Insufficient documentation

## 2020-04-08 DIAGNOSIS — I409 Acute myocarditis, unspecified: Secondary | ICD-10-CM | POA: Diagnosis not present

## 2020-04-08 DIAGNOSIS — R000 Tachycardia, unspecified: Secondary | ICD-10-CM | POA: Diagnosis not present

## 2020-04-08 DIAGNOSIS — Z7984 Long term (current) use of oral hypoglycemic drugs: Secondary | ICD-10-CM | POA: Insufficient documentation

## 2020-04-08 DIAGNOSIS — I3139 Other pericardial effusion (noninflammatory): Secondary | ICD-10-CM

## 2020-04-08 DIAGNOSIS — Z79899 Other long term (current) drug therapy: Secondary | ICD-10-CM | POA: Diagnosis not present

## 2020-04-08 DIAGNOSIS — I10 Essential (primary) hypertension: Secondary | ICD-10-CM

## 2020-04-08 MED ORDER — CARVEDILOL 6.25 MG PO TABS: 3.1250 mg | ORAL_TABLET | Freq: Two times a day (BID) | ORAL | 3 refills | Status: DC

## 2020-04-08 MED ORDER — FUROSEMIDE 40 MG PO TABS: 40.0000 mg | ORAL_TABLET | Freq: Every day | ORAL | 5 refills | Status: DC | PRN

## 2020-04-08 NOTE — Telephone Encounter (Signed)
Spoke with patient and emailed co-pay card information.  Archer Asa, CPhT

## 2020-04-08 NOTE — Patient Instructions (Signed)
Please call Heart Failure Clinic [phone] with any dizziness going forward Take Furosemide (Lasix) for weight 193 pounds or greater Continue Entresto 97/103 mg twice daily Start Coreg 3.125 mg twice daily Follow-up with our clinic --see attached appts.

## 2020-04-08 NOTE — Progress Notes (Signed)
ReDS Vest / Clip - 04/08/20 1000      ReDS Vest / Clip   Station Marker C (P)     Ruler Value 33 (P)     ReDS Value Range High volume overload (P)     ReDS Actual Value 42 (P)

## 2020-05-16 NOTE — Progress Notes (Signed)
Advanced Heart Failure Clinic Note    PCP: Bjorn Pippin, NP Primary Cardilogist: Dr. Diona Browner HF Cardiologist: Dr. Gala Romney  HPI: Craig Hanson is a 50 y.o.malewith a hx of EtOH, chronic systolic HF, and pericardial effusion.  I  Admitted 02/06/20 with acute heart failure to [facility]. Transferred to  [facility] for further management and R/LHC which showed minimal nonobstructive CAD, severe NICM EF <20% and markedly elevated filling pressures w/ preserved CO.1/29, co-ox dropped to 43% and milrinone added. Diuresed ~60 lb. Able to wean off milrinone w/ stable co-ox 63%. Started on GDMT and tolerated well. cMRI showed severe biventricular dysfunction c/w NICM. Discharge weight 183 lbs.  Saw HF Pharmacist on 03/23/20. Entresto was increased to 97-103 mg twice a day and corlanor was increased to 7.5 mg twice a day. He started higher dose of entresto on 3/21 and took both doses and had some dizziness. He took one more dose on 3/22 but then stopped due to dizziness.     Today he returns for HF follow up. Last visit entresto was restarted and coreg was added.Overall feeling fine. Denies SOB/PND/Orthopnea. Mild SOB with steps. Able to walk a mile. Appetite ok. No fever or chills. Weight at home 188-193 pounds. Taking all medications. 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   Cardiac Studies: cMRI (2/22): severe biventricular dysfunction and NICM. Moderate to large effusion. No tamponade.  R/LHC 02/13/20: Ost LAD to Prox LAD lesion is 20% stenosed, Ost Cx to Prox Cx lesion is 20% stenosed. Findings: Ao = 110/84 (95) LV = 106/33 RA =  20 RV = 59/29 PA = 62/20 (43) PCW = 38 Fick cardiac output/index = 5.1/2.3 SVR = 1186 PVR = 1.0 WU FA sat = 96% PA sat = 64%, 68% SVC sat = 66% PAPi = 2.1  Assessment: 1. Minimal nonobstructive CAD 2. 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 Markedly elevated filling pressures with preserved cardiac output   Echo (02/07/20): EF 20-25%, global  hypokinesis, grade III dd, severe RV dysfunction, mod LAE, moderate pericardial effusion  ROS: All systems negative except as listed in HPI, PMH and Problem List.  SH:  Social History   Socioeconomic History  . Marital status: Married    Spouse name: Not on file  . Number of children: Not on file  . Years of education: Not on file  . 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 Smoking status: Never Smoker  . Smokeless tobacco: Never Used  Vaping Use  . Vaping Use: Never used  Substance and Sexual Activity  . Alcohol use: Yes    Comment: 1 gallon of vodka lasts 2 weeks  . Drug use: Never  . Sexual activity: Not on file  Other Topics Concern  . Not on file  Social History Narrative  . Not on file   Social Determinants of Health   Financial Resource Strain: Not on file  Food Insecurity: Not on file  Transportation Needs: Not on file  Physical Activity: Not on file  Stress: Not on file  Social Connections: Not on file  Intimate Partner Violence: Not on file   FH:  Family History  Problem Relation Age of Onset  . Hypertension Mother   . Heart disease Father     Past Medical History:  Diagnosis Date  . Acute CHF (congestive heart failure) (HCC) 02/06/2020  . Asthma   . Pericardial effusion   . Systolic  HF (heart failure) (HCC)     Current Outpatient Medications  Medication Sig Dispense Refill  . carvedilol (COREG) 6.25 MG tablet Take 0.5 tablets (3.125 mg total) by mouth 2 (two) times daily. 180 tablet 3  . dapagliflozin propanediol (FARXIGA) 10 MG TABS tablet Take 1 tablet (10 mg total) by mouth daily before breakfast. 30 tablet 5  . digoxin (LANOXIN) 0.125 MG tablet Take 1 tablet (0.125 mg total) by mouth daily. 30 tablet 5  . furosemide (LASIX) 40 MG tablet Take 1 tablet (40 mg total) by mouth daily as needed. For weight 193 lbs or greater 30 tablet 5  . ivabradine (CORLANOR) 7.5  MG TABS tablet Take 1 tablet (7.5 mg total) by mouth 2 (two) times daily with a meal. 60 tablet 11  . sacubitril-valsartan (ENTRESTO) 97-103 MG Take 1 tablet by mouth 2 (two) times daily. 180 tablet 3  . spironolactone (ALDACTONE) 25 MG tablet Take 1 tablet (25 mg total) by mouth daily. 30 tablet 5   No current facility-administered medications for this encounter.    Vitals:   05/17/20 0932  BP: 122/84  Pulse: 65  SpO2: 98%  Weight: 92.9 kg (204 lb 12.8 oz)   Wt Readings from Last 3 Encounters:  05/17/20 92.9 kg (204 lb 12.8 oz)  04/08/20 89.1 kg (196 lb 6.4 oz)  03/23/20 87.6 kg (193 lb 3.2 oz)    PHYSICAL EXAM: General:  Well appearing. No resp difficulty HEENT: normal Neck: supple. no JVD. Carotids 2+ bilat; no bruits. No lymphadenopathy or thryomegaly appreciated. Cor: PMI nondisplaced. Regular rate & rhythm. No rubs, gallops or murmurs. Lungs: clear Abdomen: soft, nontender, nondistended. No hepatosplenomegaly. No bruits or masses. Good bowel sounds. Extremities: no cyanosis, clubbing, rash, edema Neuro: alert & orientedx3, cranial nerves grossly intact. moves all 4 extremities w/o difficulty. Affect pleasant    ASSESSMENT & PLAN: 1. Chronic systolic HF with severe biventricular dysfunction due to NICM - Echo EF 20% severe RV dysfunction moderate pericardial effusion, suspect ETOH related stopped drinking 3 months ago.  - Cath 02/13/20 with minimal CAD and massively volume overload. - cMRI EF 17% RV Severe dysfunction, ? Acute myocarditis possible sarcoid. Wall thinning.   - NYHA II. - Continue lasix as needed forweight 193 or greater.  - Continue  entresto 97-103 mg twice a day - Continue  coreg 3.125 mg twice a day.   -Continue spironolactone 25 mg daily -Cut back corlanor 3.75 mg twice a day.   Next visit maybe able to stop and increase coreg.  - Continue digoxin 0.125 mg daily. Dig level 02/25/20 0.6.  - Continue spiro 25 mg daily  - Continue Farxiga 10 mg daily. -  check BMET  - Repeat ECHO next visit. If EF remains < 35% will consider referral to EP for ICD, discussed.   2. Tachycardia -Resolved.    3. Moderate-Large pericardial effusion - No tamponade.  - Sizeable effusion on cMRI. No tamponade.  - Repeat ECHO next visit.    4. ETOH abuse -Rarely drinking alchol.   5. HTN  Stable.   Repeat ECHO next visit. Discussed purpose for ECHO. Check BMET.  Follow up with Dr Gala Romney in 6 weeks with an ECHO.   Crissa Sowder NP-C   05/17/20

## 2020-05-17 ENCOUNTER — Ambulatory Visit (HOSPITAL_COMMUNITY)
Admission: RE | Admit: 2020-05-17 | Discharge: 2020-05-17 | Disposition: A | Discharge status: Home / Self Care | Payer: Managed Care, Other (non HMO) | Source: Ambulatory Visit | Attending: Adult Health | Admitting: Adult Health

## 2020-05-17 ENCOUNTER — Other Ambulatory Visit: Payer: Self-pay

## 2020-05-17 ENCOUNTER — Encounter (HOSPITAL_COMMUNITY): Payer: Self-pay

## 2020-05-17 VITALS — BP 122/84 | HR 65 | Wt 204.8 lb

## 2020-05-17 DIAGNOSIS — Z7984 Long term (current) use of oral hypoglycemic drugs: Secondary | ICD-10-CM | POA: Insufficient documentation

## 2020-05-17 DIAGNOSIS — I3139 Other pericardial effusion (noninflammatory): Secondary | ICD-10-CM

## 2020-05-17 DIAGNOSIS — R000 Tachycardia, unspecified: Secondary | ICD-10-CM | POA: Diagnosis not present

## 2020-05-17 DIAGNOSIS — I313 Pericardial effusion (noninflammatory): Secondary | ICD-10-CM | POA: Diagnosis not present

## 2020-05-17 DIAGNOSIS — Z79899 Other long term (current) drug therapy: Secondary | ICD-10-CM | POA: Insufficient documentation

## 2020-05-17 DIAGNOSIS — R0602 Shortness of breath: Secondary | ICD-10-CM | POA: Diagnosis not present

## 2020-05-17 DIAGNOSIS — F1010 Alcohol abuse, uncomplicated: Secondary | ICD-10-CM | POA: Diagnosis not present

## 2020-05-17 DIAGNOSIS — Z8249 Family history of ischemic heart disease and other diseases of the circulatory system: Secondary | ICD-10-CM | POA: Diagnosis not present

## 2020-05-17 DIAGNOSIS — I5022 Chronic systolic (congestive) heart failure: Secondary | ICD-10-CM

## 2020-05-17 DIAGNOSIS — I2510 Atherosclerotic heart disease of native coronary artery without angina pectoris: Secondary | ICD-10-CM | POA: Diagnosis not present

## 2020-05-17 DIAGNOSIS — I428 Other cardiomyopathies: Secondary | ICD-10-CM | POA: Diagnosis not present

## 2020-05-17 DIAGNOSIS — I110 Hypertensive heart disease with heart failure: Secondary | ICD-10-CM | POA: Insufficient documentation

## 2020-05-17 DIAGNOSIS — I10 Essential (primary) hypertension: Secondary | ICD-10-CM

## 2020-05-17 LAB — BASIC METABOLIC PANEL
Anion gap: 9 (ref 5–15)
BUN: 9 mg/dL (ref 6–20)
CO2: 28 mmol/L (ref 22–32)
Calcium: 9.4 mg/dL (ref 8.9–10.3)
Chloride: 101 mmol/L (ref 98–111)
Creatinine, Ser: 1.03 mg/dL (ref 0.61–1.24)
GFR, Estimated: >60 mL/min (ref >60)
Glucose, Bld: 98 mg/dL (ref 70–99)
Potassium: 3.5 mmol/L (ref 3.5–5.1)
Sodium: 138 mmol/L (ref 135–145)

## 2020-05-17 MED ORDER — IVABRADINE HCL 7.5 MG PO TABS: 3.7500 mg | ORAL_TABLET | Freq: Two times a day (BID) | ORAL | 11 refills | Status: DC

## 2020-05-17 NOTE — Patient Instructions (Addendum)
DECREASE Corlanor to 1/2 tab twice a day  Labs today We will only contact you if something comes back abnormal or we need to make some changes. Otherwise no news is good news!  Your physician has requested that you have an echocardiogram on the day of your next appoinmtnet. Echocardiography is a painless test that uses sound waves to create images of your heart. It provides your doctor with information about the size and shape of your heart and how well your heart's chambers and valves are working. This procedure takes approximately one hour. There are no restrictions for this procedure.  Your physician recommends that you schedule a follow-up appointment in: follow up with Dr Gala Romney at already scheduled appointment on June 24th at 11am.   Please call office at [phone] option 2 if you have any questions or concerns.   At the Advanced Heart Failure Clinic, you and your health needs are our priority. As part of our continuing mission to provide you with exceptional heart care, we have created designated Provider Care Teams. These Care Teams include your primary Cardiologist (physician) and Advanced Practice Providers (APPs- Physician Assistants and Nurse Practitioners) who all work together to provide you with the care you need, when you need it.   You may see any of the following providers on your designated Care Team at your next follow up: Marland Kitchen Dr Arvilla Meres . Dr Marca Ancona . Dr Thornell Mule . Tonye Becket, NP . Robbie Lis, PA . Shanda Bumps [place],NP . Karle Plumber, PharmD   Please be sure to bring in all your medications bottles to every appointment.

## 2020-05-26 LAB — BASIC METABOLIC PANEL
BUN/Creatinine Ratio: 8 — ABNORMAL LOW (ref 9–20)
BUN: 9 mg/dL (ref 6–24)
CO2: 24 mmol/L (ref 20–29)
Calcium: 9.3 mg/dL (ref 8.7–10.2)
Chloride: 102 mmol/L (ref 96–106)
Creatinine, Ser: 1.08 mg/dL (ref 0.76–1.27)
Glucose: 92 mg/dL (ref 65–99)
Potassium: 3.5 mmol/L (ref 3.5–5.2)
Sodium: 140 mmol/L (ref 134–144)
eGFR: 83 mL/min/{1.73_m2} (ref >59)

## 2020-05-26 NOTE — Progress Notes (Signed)
Metabolic panel looks great

## 2020-05-27 LAB — BRAIN NATRIURETIC PEPTIDE: BNP: 347.4 pg/mL — ABNORMAL HIGH (ref 0.0–100.0)

## 2020-05-27 NOTE — Progress Notes (Signed)
BNP is elevated at 347. This is one of several indicators of fluid status. We will discuss this tomorrow, so no need to call.

## 2020-05-28 ENCOUNTER — Encounter: Payer: Self-pay | Admitting: Nurse Practitioner

## 2020-05-28 ENCOUNTER — Other Ambulatory Visit: Payer: Self-pay

## 2020-05-28 ENCOUNTER — Ambulatory Visit (INDEPENDENT_AMBULATORY_CARE_PROVIDER_SITE_OTHER): Payer: Managed Care, Other (non HMO) | Admitting: Nurse Practitioner

## 2020-05-28 DIAGNOSIS — I5022 Chronic systolic (congestive) heart failure: Secondary | ICD-10-CM | POA: Diagnosis not present

## 2020-05-28 NOTE — Progress Notes (Signed)
Established Patient Office Visit  Subjective:  Patient ID: Craig Hanson, male    DOB: Aug 17, 1970  Age: 50 y.o. MRN: [id number]  CC:  Chief Complaint  Patient presents with  . Follow-up    Go over labs     HPI [name] presents for lab follow-up. He denies leg swelling.  He states that he is feeling much better. His appetite has come back, and he states he is eating well now. He isn't drinking alcohol any more, and takes lasix as needed if he has leg swelling. He states he doesn't have to take lasix often.  He will see Dr. Haroldine Laws and have echo at the end of June.  Wt Readings from Last 3 Encounters:  05/28/20 206 lb (93.4 kg)  05/17/20 204 lb 12.8 oz (92.9 kg)  04/08/20 196 lb 6.4 oz (89.1 kg)     Past Medical History:  Diagnosis Date  . Acute CHF (congestive heart failure) ([place]) 02/06/2020  . Asthma   . Pericardial effusion   . Systolic HF (heart failure) [facility])     Past Surgical History:  Procedure Laterality Date  . No prior surgery    . RIGHT/LEFT HEART CATH AND CORONARY ANGIOGRAPHY N/A 02/13/2020   Procedure: RIGHT/LEFT HEART CATH AND CORONARY ANGIOGRAPHY;  Surgeon: Jolaine Artist, MD;  Location: [facility];  Service: Cardiovascular;  Laterality: N/A;    Family History  Problem Relation Age of Onset  . Hypertension Mother   . Heart disease Father     Social History   Socioeconomic History  . Marital status: Married    Spouse name: Not on file  . Number of children: Not on file  . Years of education: Not on file  . 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 Smoking status: Never Smoker  . Smokeless tobacco: Never Used  Vaping Use  . Vaping Use: Never used  Substance and Sexual Activity  . Alcohol use: Yes    Comment: 1 gallon of vodka lasts 2 weeks  . Drug use: Never  . Sexual activity: Not on file  Other Topics Concern  . Not on file   Social History Narrative  . Not on file   Social Determinants of Health   Financial Resource Strain: Not on file  Food Insecurity: Not on file  Transportation Needs: Not on file  Physical Activity: Not on file  Stress: Not on file  Social Connections: Not on file  Intimate Partner Violence: Not on file    Outpatient Medications Prior to Visit  Medication Sig Dispense Refill  . carvedilol (COREG) 6.25 MG tablet Take 0.5 tablets (3.125 mg total) by mouth 2 (two) times daily. 180 tablet 3  . dapagliflozin propanediol (FARXIGA) 10 MG TABS tablet Take 1 tablet (10 mg total) by mouth daily before breakfast. 30 tablet 5  . digoxin (LANOXIN) 0.125 MG tablet Take 1 tablet (0.125 mg total) by mouth daily. 30 tablet 5  . furosemide (LASIX) 40 MG tablet Take 1 tablet (40 mg total) by mouth daily as needed. For weight 193 lbs or greater 30 tablet 5  . ivabradine (CORLANOR) 7.5 MG TABS tablet Take 0.5 tablets (3.75 mg total) by mouth 2 (two) times daily with a meal. 30 tablet 11  . sacubitril-valsartan (ENTRESTO) 97-103 MG Take 1 tablet by mouth 2 (two) times daily. 180 tablet 3  . spironolactone (ALDACTONE) 25 MG tablet Take  1 tablet (25 mg total) by mouth daily. 30 tablet 5   No facility-administered medications prior to visit.    No Known Allergies  ROS Review of Systems  Constitutional: Negative.   Respiratory: Negative.   Cardiovascular: Negative.   Musculoskeletal: Negative.   Psychiatric/Behavioral: Negative.       Objective:    Physical Exam Constitutional:      Appearance: Normal appearance.  Cardiovascular:     Rate and Rhythm: Normal rate and regular rhythm.     Pulses: Normal pulses.     Heart sounds: Normal heart sounds.  Pulmonary:     Effort: Pulmonary effort is normal.     Breath sounds: Normal breath sounds.  Musculoskeletal:        General: Normal range of motion.  Neurological:     Mental Status: He is alert.  Psychiatric:        Mood and Affect: Mood  normal.        Behavior: Behavior normal.        Thought Content: Thought content normal.        Judgment: Judgment normal.     BP (!) 144/87   Pulse 68   Temp 98.1 F (36.7 C)   Resp 20   Ht $R'5\' 8"'jF$  (1.727 m)   Wt 206 lb (93.4 kg)   SpO2 98%   BMI 31.32 kg/m  Wt Readings from Last 3 Encounters:  05/28/20 206 lb (93.4 kg)  05/17/20 204 lb 12.8 oz (92.9 kg)  04/08/20 196 lb 6.4 oz (89.1 kg)     Health Maintenance Due  Topic Date Due  . COLONOSCOPY (Pts 45-39yrs Insurance coverage will need to be confirmed)  Never done  . COVID-19 Vaccine (3 - Booster for Moderna series) 04/17/2020    There are no preventive care reminders to display for this patient.  Lab Results  Component Value Date   TSH 3.364 02/10/2020   Lab Results  Component Value Date   WBC 9.0 02/19/2020   HGB 13.9 02/19/2020   HCT 42.8 02/19/2020   MCV 92.4 02/19/2020   PLT 261 02/19/2020   Lab Results  Component Value Date   NA 140 05/25/2020   K 3.5 05/25/2020   CO2 24 05/25/2020   GLUCOSE 92 05/25/2020   BUN 9 05/25/2020   CREATININE 1.08 05/25/2020   BILITOT 1.3 (H) 02/08/2020   ALKPHOS 120 02/08/2020   AST 48 (H) 02/08/2020   ALT 43 02/08/2020   PROT 6.2 (L) 02/08/2020   ALBUMIN 2.9 (L) 02/08/2020   CALCIUM 9.3 05/25/2020   ANIONGAP 9 05/17/2020   EGFR 83 05/25/2020   Lab Results  Component Value Date   CHOL 150 01/22/2020   Lab Results  Component Value Date   HDL 38 (L) 01/22/2020   Lab Results  Component Value Date   LDLCALC 96 01/22/2020   Lab Results  Component Value Date   TRIG 84 01/22/2020   No results found for: CHOLHDL No results found for: HGBA1C    Assessment & Plan:   Problem List Items Addressed This Visit      Cardiovascular and Mediastinum   Chronic systolic heart failure ([place])    Wt Readings from Last 3 Encounters:  05/28/20 206 lb (93.4 kg)  05/17/20 204 lb 12.8 oz (92.9 kg)  04/08/20 196 lb 6.4 oz (89.1 kg)  -his discharge weight was 183  pounds -he states that he stopped drinking, and his appetite has come back -no leg swelling today or pulmonary  crackles; he looks great and has a marked improvement from the last time I saw him       Relevant Orders   CBC with Differential/Platelet   CMP14+EGFR   Lipid Panel With LDL/HDL Ratio      No orders of the defined types were placed in this encounter.   Follow-up: Return in about 4 months (around 09/28/2020) for Lab follow-up (CHF).    Noreene Larsson, NP

## 2020-05-28 NOTE — Assessment & Plan Note (Signed)
Wt Readings from Last 3 Encounters:  05/28/20 206 lb (93.4 kg)  05/17/20 204 lb 12.8 oz (92.9 kg)  04/08/20 196 lb 6.4 oz (89.1 kg)  -his discharge weight was 183 pounds -he states that he stopped drinking, and his appetite has come back -no leg swelling today or pulmonary crackles; he looks great and has a marked improvement from the last time I saw him

## 2020-05-28 NOTE — Patient Instructions (Signed)
Please have fasting labs drawn 2-3 days prior to your appointment so we can discuss the results during your office visit.  

## 2020-07-09 ENCOUNTER — Ambulatory Visit (HOSPITAL_BASED_OUTPATIENT_CLINIC_OR_DEPARTMENT_OTHER)
Admission: RE | Admit: 2020-07-09 | Discharge: 2020-07-09 | Disposition: A | Discharge status: Home / Self Care | Payer: Managed Care, Other (non HMO) | Source: Ambulatory Visit | Attending: Internal Medicine | Admitting: Internal Medicine

## 2020-07-09 ENCOUNTER — Ambulatory Visit (HOSPITAL_COMMUNITY)
Admission: RE | Admit: 2020-07-09 | Discharge: 2020-07-09 | Disposition: A | Discharge status: Home / Self Care | Payer: Managed Care, Other (non HMO) | Source: Ambulatory Visit | Attending: Internal Medicine | Admitting: Internal Medicine

## 2020-07-09 ENCOUNTER — Other Ambulatory Visit: Payer: Self-pay

## 2020-07-09 VITALS — BP 150/82 | HR 62 | Ht 68.0 in | Wt 214.4 lb

## 2020-07-09 DIAGNOSIS — Z79899 Other long term (current) drug therapy: Secondary | ICD-10-CM | POA: Insufficient documentation

## 2020-07-09 DIAGNOSIS — I10 Essential (primary) hypertension: Secondary | ICD-10-CM

## 2020-07-09 DIAGNOSIS — I428 Other cardiomyopathies: Secondary | ICD-10-CM | POA: Insufficient documentation

## 2020-07-09 DIAGNOSIS — Z8249 Family history of ischemic heart disease and other diseases of the circulatory system: Secondary | ICD-10-CM | POA: Insufficient documentation

## 2020-07-09 DIAGNOSIS — I3139 Other pericardial effusion (noninflammatory): Secondary | ICD-10-CM

## 2020-07-09 DIAGNOSIS — I5022 Chronic systolic (congestive) heart failure: Secondary | ICD-10-CM

## 2020-07-09 DIAGNOSIS — I313 Pericardial effusion (noninflammatory): Secondary | ICD-10-CM | POA: Diagnosis not present

## 2020-07-09 DIAGNOSIS — I340 Nonrheumatic mitral (valve) insufficiency: Secondary | ICD-10-CM | POA: Insufficient documentation

## 2020-07-09 DIAGNOSIS — I110 Hypertensive heart disease with heart failure: Secondary | ICD-10-CM | POA: Insufficient documentation

## 2020-07-09 DIAGNOSIS — I2510 Atherosclerotic heart disease of native coronary artery without angina pectoris: Secondary | ICD-10-CM | POA: Diagnosis not present

## 2020-07-09 LAB — CBC
HCT: 47.2 % (ref 39.0–52.0)
Hemoglobin: 16.2 g/dL (ref 13.0–17.0)
MCH: 32.1 pg (ref 26.0–34.0)
MCHC: 34.3 g/dL (ref 30.0–36.0)
MCV: 93.5 fL (ref 80.0–100.0)
Platelets: 243 10*3/uL (ref 150–400)
RBC: 5.05 MIL/uL (ref 4.22–5.81)
RDW: 13.2 % (ref 11.5–15.5)
WBC: 6.3 10*3/uL (ref 4.0–10.5)
nRBC: 0 % (ref 0.0–0.2)

## 2020-07-09 LAB — BASIC METABOLIC PANEL
Anion gap: 5 (ref 5–15)
BUN: 10 mg/dL (ref 6–20)
CO2: 30 mmol/L (ref 22–32)
Calcium: 9.5 mg/dL (ref 8.9–10.3)
Chloride: 101 mmol/L (ref 98–111)
Creatinine, Ser: 1.08 mg/dL (ref 0.61–1.24)
GFR, Estimated: >60 mL/min (ref >60)
Glucose, Bld: 102 mg/dL — ABNORMAL HIGH (ref 70–99)
Potassium: 4 mmol/L (ref 3.5–5.1)
Sodium: 136 mmol/L (ref 135–145)

## 2020-07-09 LAB — ECHOCARDIOGRAM COMPLETE
Area-P 1/2: 10.25 cm2
S' Lateral: 5.4 cm
Single Plane A4C EF: 38.4 %

## 2020-07-09 LAB — BRAIN NATRIURETIC PEPTIDE: B Natriuretic Peptide: 154.9 pg/mL — ABNORMAL HIGH (ref 0.0–100.0)

## 2020-07-09 MED ORDER — CARVEDILOL 6.25 MG PO TABS: 6.2500 mg | ORAL_TABLET | Freq: Two times a day (BID) | ORAL | 3 refills | Status: DC

## 2020-07-09 NOTE — Progress Notes (Signed)
Advanced Heart Failure Clinic Note    PCP: Bjorn Pippin, NP Primary Cardilogist: Dr. Diona Browner HF Cardiologist: Dr. Gala Romney  HPI: Craig Hanson is a 50 y.o. male with a hx of EtOH, chronic systolic HF, and pericardial effusion.  I   Admitted 02/06/20 with acute heart failure to [facility]. Transferred to  [facility] for further management and R/LHC which showed minimal nonobstructive CAD, severe NICM EF <20% and markedly elevated filling pressures w/ preserved CO.1/29, co-ox dropped to 43% and milrinone added. Diuresed ~60 lb. Able to wean off milrinone w/ stable co-ox 63%. Started on GDMT and tolerated well. cMRI showed severe biventricular dysfunction c/w NICM. Discharge weight 183 lbs.  Saw HF Pharmacist on 03/23/20. Entresto was increased to 97-103 mg twice a day and corlanor was increased to 7.5 mg twice a day. He started higher dose of entresto on 3/21 and took both doses and had some dizziness. He took one more dose on 3/22 but then stopped due to dizziness.     Last visit doing well, corlanor decreased   Feeling well since last visit.  Doesn't check bp at home.  Weight is up but he attributes this to bulking up and working out.  Weighs himself at home.  Denies dyspnea on exertion.  Taking lasix daily since weight has been over 206lbs for about one month now.   No trouble with exertion can go up and down steps with ease, working out.    Cardiac Studies: cMRI (2/22): severe biventricular dysfunction and NICM. Moderate to large effusion. No tamponade.  R/LHC 02/13/20: Ost LAD to Prox LAD lesion is 20% stenosed, Ost Cx to Prox Cx lesion is 20% stenosed. Findings:  Ao = 110/84 (95) LV = 106/33 RA =  20 RV = 59/29 PA = 62/20 (43) PCW = 38 Fick cardiac output/index = 5.1/2.3 SVR = 1186 PVR = 1.0 WU FA sat = 96% PA sat = 64%, 68% SVC sat = 66% PAPi = 2.1   Assessment: 1. Minimal nonobstructive CAD 2. 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 Markedly elevated filling pressures with preserved cardiac output   Echo  (02/07/20): EF 20-25%, global hypokinesis, grade III dd, severe RV dysfunction, mod LAE, moderate pericardial effusion  ROS: All systems negative except as listed in HPI, PMH and Problem List.  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  Tobacco Use   Smoking status: Never   Smokeless tobacco: Never  Vaping Use   Vaping Use: Never used  Substance and Sexual Activity   Alcohol use: Yes    Comment: 1 gallon of vodka lasts 2 weeks   Drug use: Never   Sexual activity: Not on file  Other Topics Concern   Not on file  Social History Narrative   Not on file   Social Determinants of Health   Financial Resource Strain: Not on file  Food Insecurity: Not on file  Transportation Needs: Not on file  Physical Activity: Not on file  Stress: Not on file  Social Connections: Not on file  Intimate Partner Violence: Not on file   FH:  Family History  Problem Relation Age of Onset   Hypertension Mother    Heart disease Father     Past Medical  History:  Diagnosis Date   Acute CHF (congestive heart failure) ([facility]) 02/06/2020   Asthma    Pericardial effusion    Systolic HF (heart failure) ([facility])     Current Outpatient Medications  Medication Sig Dispense Refill   carvedilol (COREG) 6.25 MG tablet Take 0.5 tablets (3.125 mg total) by mouth 2 (two) times daily. 180 tablet 3   dapagliflozin propanediol (FARXIGA) 10 MG TABS tablet Take 1 tablet (10 mg total) by mouth daily before breakfast. 30 tablet 5   digoxin (LANOXIN) 0.125 MG tablet Take 1 tablet (0.125 mg total) by mouth daily. 30 tablet 5   furosemide (LASIX) 40 MG tablet Take 1 tablet (40 mg total) by mouth daily as needed. For weight 193 lbs or greater 30 tablet 5   ivabradine (CORLANOR) 7.5 MG TABS  tablet Take 0.5 tablets (3.75 mg total) by mouth 2 (two) times daily with a meal. 30 tablet 11   sacubitril-valsartan (ENTRESTO) 97-103 MG Take 1 tablet by mouth 2 (two) times daily. 180 tablet 3   spironolactone (ALDACTONE) 25 MG tablet Take 1 tablet (25 mg total) by mouth daily. 30 tablet 5   No current facility-administered medications for this encounter.    Vitals:   07/09/20 0929  Weight: 97.3 kg (214 lb 6.4 oz)  Height: 5\' 8"  (1.727 m)   Wt Readings from Last 3 Encounters:  07/09/20 97.3 kg (214 lb 6.4 oz)  05/28/20 93.4 kg (206 lb)  05/17/20 92.9 kg (204 lb 12.8 oz)    PHYSICAL EXAM: General:  Well appearing. No resp difficulty HEENT: normal Neck: supple. no JVD. Carotids 2+ bilat; no bruits. No lymphadenopathy or thryomegaly appreciated. Cor: PMI nondisplaced. Regular rate & rhythm. No rubs, gallops or murmurs. Lungs: clear Abdomen: soft, nontender, nondistended. No hepatosplenomegaly. No bruits or masses. Good bowel sounds. Extremities: no cyanosis, clubbing, rash, edema Neuro: alert & orientedx3, cranial nerves grossly intact. moves all 4 extremities w/o difficulty. Affect pleasant    ASSESSMENT & PLAN: 1. Chronic systolic HF with severe biventricular dysfunction due to NICM - 01/2020 Echo EF 20% severe RV dysfunction moderate pericardial effusion, suspect ETOH related stopped drinking 3 months ago.  - Cath 02/13/20 with minimal CAD and massively volume overload. - cMRI EF 17% RV Severe dysfunction, ? Acute myocarditis possible sarcoid. Wall thinning.   - NYHA II. Euvolemic to slightly dry on exam, REDS 39% which is discordant with physical exam and completely collapsible IVC on echo - Repeat ECHO today 07/09/20 with EF 25-30% - Currently taking lasix 40mg  daily for weight over 206lbs, has been taking for a month now.  He is euvolemic will switch to PRN for acute weight gain and edema - Continue Farxiga 10 mg daily. - Continue  entresto 97-103 mg twice a day  -Continue  spironolactone 25 mg daily - d/c corlanor, increase coreg to 6.25mg   - d/c digoxin - check BMET   2. Moderate-Large pericardial effusion -very small on today's echo   3. ETOH abuse -Rarely drinking alchohol.   4. HTN  -Increase carvedilol as above       07/09/20  Patient seen and examined with the above-signed Advanced Practice Provider and/or Housestaff. I personally reviewed laboratory data, imaging studies and relevant notes. I independently examined the patient and formulated the important aspects of the plan. I have edited the note to reflect any of my changes or salient points. I have personally discussed the plan with the patient and/or family.  He is doing well. NYHA  II. Volume status looks good. Echo today EF 30-35% smal pericardial effusion.   General:  Well appearing. No resp difficulty HEENT: normal Neck: supple. no JVD. Carotids 2+ bilat; no bruits. No lymphadenopathy or thryomegaly appreciated. Cor: PMI nondisplaced. Regular rate & rhythm. No rubs, gallops or murmurs. Lungs: clear Abdomen: soft, nontender, nondistended. No hepatosplenomegaly. No bruits or masses. Good bowel sounds. Extremities: no cyanosis, clubbing, rash, edema Neuro: alert & orientedx3, cranial nerves grossly intact. moves all 4 extremities w/o difficulty. Affect pleasant  As above, NYHA II. On good meds. Will increase carvedilol. Role of ICD discussed. He would like to proceed. Refer to EP.   Arvilla Meres, MD  11:08 PM

## 2020-07-09 NOTE — Progress Notes (Signed)
  Echocardiogram 2D Echocardiogram has been performed.  Craig Hanson 07/09/2020, 9:31 AM

## 2020-07-09 NOTE — Progress Notes (Signed)
ReDS Vest / Clip - 07/09/20 1000       ReDS Vest / Clip   Station Marker D    Ruler Value 34    ReDS Value Range Moderate volume overload    ReDS Actual Value 39

## 2020-07-09 NOTE — Patient Instructions (Signed)
Increase Carvedilol to 6.25 mg Twice daily   Stop Digoxin  Stop Corlanor  Labs done today, we will call you for abnormal results  You have been referred to EP to discuss possibly getting a defibrillator, their office will call you for an appointment  Your physician recommends that you schedule a follow-up appointment in: 4 months  Do the following things EVERYDAY: Weigh yourself in the morning before breakfast. Write it down and keep it in a log. Take your medicines as prescribed Eat low salt foods--Limit salt (sodium) to 2000 mg per day.  Stay as active as you can everyday Limit all fluids for the day to less than 2 liters  If you have any questions or concerns before your next appointment please send [place] a message through [place] or call our office at [phone].    TO LEAVE A MESSAGE FOR THE NURSE SELECT OPTION 2, PLEASE LEAVE A MESSAGE INCLUDING: YOUR NAME DATE OF BIRTH CALL BACK NUMBER REASON FOR CALL**this is important as we prioritize the call backs  YOU WILL RECEIVE A CALL BACK THE SAME DAY AS LONG AS YOU CALL BEFORE 4:00 PM  At the [facility], you and your health needs are our priority. As part of our continuing mission to provide you with exceptional heart care, we have created designated Provider Care Teams. These Care Teams include your primary Cardiologist (physician) and Advanced Practice Providers (APPs- Physician Assistants and Nurse Practitioners) who all work together to provide you with the care you need, when you need it.   You may see any of the following providers on your designated Care Team at your next follow up: Dr Arvilla Meres Dr Marca Ancona Dr Brandon Melnick, NP Robbie Lis, [place] Mikki Santee Karle Plumber, PharmD   Please be sure to bring in all your medications bottles to every appointment.

## 2020-08-06 ENCOUNTER — Other Ambulatory Visit: Payer: Self-pay | Admitting: Cardiology

## 2020-08-20 ENCOUNTER — Telehealth (HOSPITAL_COMMUNITY): Payer: Self-pay | Admitting: *Deleted

## 2020-08-20 NOTE — Telephone Encounter (Signed)
Wendy case Production designer, theatre/television/film with disability left vm stating she needs updated restrictions and limitations and duration. She requests a call back [phone] ext [phone].   Routed to [employer]

## 2020-08-23 ENCOUNTER — Institutional Professional Consult (permissible substitution): Payer: Managed Care, Other (non HMO) | Admitting: Internal Medicine

## 2020-08-23 DIAGNOSIS — I5022 Chronic systolic (congestive) heart failure: Secondary | ICD-10-CM

## 2020-08-26 ENCOUNTER — Encounter (HOSPITAL_COMMUNITY): Payer: Self-pay | Admitting: *Deleted

## 2020-08-26 NOTE — Progress Notes (Signed)
Did not be patient ID: Craig Hanson, male   DOB: [birth year], 50 y.o.   MRN: [id number]      ELECTROPHYSIOLOGY CONSULT NOTE  Patient ID: Craig Hanson, MRN: [id number], DOB/AGE: [birth year] 50 y.o. Admit date: (Not on file) Date of Consult: 08/27/2020  Primary Physician: Heather Roberts, NP Primary Cardiologist: DB     Craig Hanson is a 50 y.o. male who is being seen today for the evaluation of ICD at the request of DB.    HPI Craig Hanson is a 50 y.o. male referred for consideration of an ICD for primary prevention in the setting of nonischemic cardiomyopathy with a strong history of antecedent alcohol use.  Admitted initially 1/22.  At that point he was orthopneic, unable to walk 10-15 feet, severely edematous.  Diuresed 60+ pounds.  Aggressively treated with guideline directed medical therapy with which she has been compliant.  No further alcohol.  No smoking.  Today, the patient denies chest pain, shortness of breath, nocturnal dyspnea, orthopnea or peripheral edema.  There have been no palpitations, lightheadedness or syncope.Marland Kitchen  DATE TEST EF%   1/22 Echo 20-25%   1/22 Echo  20-25%    1/22 [facility]  No obstructive CAD  2/22 cMRI  17% LGE+ basal/septal Midwall Increases T1 cw edema ? myocarditis  6/22 Echo 25-35%    Date Cr K Hgb  6/22  1.08 4.0 16.2            Past Medical History:  Diagnosis Date   Acute CHF (congestive heart failure) ([facility]) 02/06/2020   Asthma    Pericardial effusion    Systolic HF (heart failure) ([facility])       Surgical History:  Past Surgical History:  Procedure Laterality Date   No prior surgery     RIGHT/LEFT HEART CATH AND CORONARY ANGIOGRAPHY N/A 02/13/2020   Procedure: RIGHT/LEFT HEART CATH AND CORONARY ANGIOGRAPHY;  Surgeon: Dolores Patty, MD;  Location: [facility];  Service: Cardiovascular;  Laterality: N/A;     Home Meds: Current Meds  Medication Sig   carvedilol (COREG) 6.25 MG tablet Take 1 tablet (6.25 mg  total) by mouth 2 (two) times daily.   FARXIGA 10 MG TABS tablet TAKE 1 TABLET BY MOUTH EVERY DAY BEFORE BREAKFAST   furosemide (LASIX) 40 MG tablet Take 1 tablet (40 mg total) by mouth daily as needed. For weight 193 lbs or greater   sacubitril-valsartan (ENTRESTO) 97-103 MG Take 1 tablet by mouth 2 (two) times daily.   spironolactone (ALDACTONE) 25 MG tablet Take 1 tablet (25 mg total) by mouth daily.    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  Tobacco Use   Smoking status: Never   Smokeless tobacco: Never  Vaping Use   Vaping Use: Never used  Substance and Sexual Activity   Alcohol use: Yes    Comment: 1 gallon of vodka lasts 2 weeks   Drug use: Never   Sexual activity: Not on file  Other Topics Concern   Not on file  Social History Narrative   Not on file   Social Determinants of Health   Financial Resource Strain: Not on file  Food Insecurity: Not  on file  Transportation Needs: Not on file  Physical Activity: Not on file  Stress: Not on file  Social Connections: Not on file  Intimate Partner Violence: Not on file     Family History  Problem Relation Age of Onset   Hypertension Mother    Heart disease Father      ROS:  Please see the history of present illness.     All other systems reviewed and negative.   Physical Exam: BP 139/88   Pulse 62   Ht 5\' 8"  (1.727 m)   Wt 215 lb 3.2 oz (97.6 kg)   SpO2 99%   BMI 32.72 kg/m  Alert and oriented in no acute distress HENT- normal Eyes- EOMI, without scleral icterus Skin- warm and dry; without rashes LN-neg Neck- supple without thyromegaly, JVP-flat, carotids brisk and full without bruits Back-without CVAT or kyphosis Lungs-clear to  auscultation CV-Regular rate and rhythm, nl S1 and S2, no murmurs gallops or rubs, S4-absent Abd-soft with active bowel sounds; no midline pulsation or hepatomegaly Pulses-intact femoral and distal MKS-without gross deformity Neuro- Ax O, CN3-12 intact, grossly normal motor and sensory function Affect engaging       Labs: Cardiac Enzymes No results for input(s): CKTOTAL, CKMB, TROPONINI in the last 72 hours. CBC Lab Results  Component Value Date   WBC 6.3 07/09/2020   HGB 16.2 07/09/2020   HCT 47.2 07/09/2020   MCV 93.5 07/09/2020   PLT 243 07/09/2020   PROTIME: No results for input(s): LABPROT, INR in the last 72 hours. Chemistry No results for input(s): NA, K, CL, CO2, BUN, CREATININE, CALCIUM, PROT, BILITOT, ALKPHOS, ALT, AST, GLUCOSE in the last 168 hours.  Invalid input(s): LABALBU Lipids Lab Results  Component Value Date   CHOL 150 01/22/2020   HDL 38 (L) 01/22/2020   LDLCALC 96 01/22/2020   TRIG 84 01/22/2020   BNP No results found for: PROBNP Thyroid Function Tests: No results for input(s): TSH, T4TOTAL, T3FREE, THYROIDAB in the last 72 hours.  Invalid input(s): FREET3 Miscellaneous No results found for: DDIMER  Radiology/Studies:  No results found.  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  Moreover, his symptoms previously class for now class I/II without any identifiable limitations by history.  Prior to ICD implantation it would be reasonable to objectively measure this and will discuss with Dr. 2/22 the role of CPX.  In the event that he were class I, an ICD would not be indicated, with class II would be appropriate  especially given his young age.  I wonder also what role the vaccine may have played in triggering his cardiomyopathy given the temporal relationship and the myocardial edema suggestive of myocarditis.  I will need to do some reading about this;l the fact that there was LGE i.e. scar which suggest some persistent cardiomyopathic risk.

## 2020-08-26 NOTE — Progress Notes (Signed)
Pt's "Attending PHysician's Statement" completed and signed by Dr Gala Romney. Form faxed to [employer] at [phone]

## 2020-08-27 ENCOUNTER — Encounter: Payer: Self-pay | Admitting: Internal Medicine

## 2020-08-27 ENCOUNTER — Other Ambulatory Visit: Payer: Self-pay

## 2020-08-27 ENCOUNTER — Ambulatory Visit: Payer: Managed Care, Other (non HMO) | Admitting: Internal Medicine

## 2020-08-27 VITALS — BP 139/88 | HR 62 | Ht 68.0 in | Wt 215.2 lb

## 2020-08-27 DIAGNOSIS — I428 Other cardiomyopathies: Secondary | ICD-10-CM

## 2020-08-27 NOTE — Patient Instructions (Signed)
Medication Instructions:  Your physician recommends that you continue on your current medications as directed. Please refer to the Current Medication list given to you today.  Labwork: None ordered.  Testing/Procedures: None ordered.  Follow-Up: Your physician recommends that you schedule a follow-up appointment in:   As Needed with Dr. Klein  Any Other Special Instructions Will Be Listed Below (If Applicable).     If you need a refill on your cardiac medications before your next appointment, please call your pharmacy.  

## 2020-09-07 ENCOUNTER — Telehealth: Payer: Self-pay

## 2020-09-07 NOTE — Telephone Encounter (Signed)
FMLA forms         Noted   Copied   Sleeved  

## 2020-09-29 ENCOUNTER — Other Ambulatory Visit: Payer: Self-pay

## 2020-09-29 ENCOUNTER — Ambulatory Visit (INDEPENDENT_AMBULATORY_CARE_PROVIDER_SITE_OTHER): Payer: Self-pay | Admitting: Nurse Practitioner

## 2020-09-29 ENCOUNTER — Encounter: Payer: Self-pay | Admitting: Nurse Practitioner

## 2020-09-29 VITALS — BP 125/74 | HR 73 | Temp 98.2°F | Ht 68.0 in | Wt 217.0 lb

## 2020-09-29 DIAGNOSIS — F1010 Alcohol abuse, uncomplicated: Secondary | ICD-10-CM

## 2020-09-29 DIAGNOSIS — I5022 Chronic systolic (congestive) heart failure: Secondary | ICD-10-CM

## 2020-09-29 DIAGNOSIS — R7301 Impaired fasting glucose: Secondary | ICD-10-CM | POA: Diagnosis not present

## 2020-09-29 NOTE — Assessment & Plan Note (Signed)
-  stopped drinking with onset of heart issues -encouraged to continue this

## 2020-09-29 NOTE — Patient Instructions (Signed)
Please have labs drawn today.  We will meet up in a few months to review labs. Please have fasting labs drawn 2-3 days prior to your appointment so we can discuss the results during your office visit.

## 2020-09-29 NOTE — Assessment & Plan Note (Signed)
-  had June echo with Bensimhon, LVEF up to 25-30% -continue medications per cardiology -he is taking lasix PRN if his weight gets to around 220#

## 2020-09-29 NOTE — Assessment & Plan Note (Signed)
-   check A1c

## 2020-09-29 NOTE — Progress Notes (Signed)
Acute Office Visit  Subjective:    Patient ID: Craig Hanson, male    DOB: 1970-11-26, 50 y.o.   MRN: [id number]  Chief Complaint  Patient presents with   Follow-up    HPI Patient is in today for lab follow-up.  He had recent echo with Dr. Haroldine Laws, and LVEF was 25-30%, improved from < 20%. He has been taking his medicines as prescribed, and he takes lasix as needed if he weighs around 220#.  Denies SOB or SOB with exertion.  He is walking, mowing, and doing push-ups for exercise.  Past Medical History:  Diagnosis Date   Acute CHF (congestive heart failure) ([place]) 02/06/2020   Asthma    Pericardial effusion    Systolic HF (heart failure) ([place])     Past Surgical History:  Procedure Laterality Date   No prior surgery     RIGHT/LEFT HEART CATH AND CORONARY ANGIOGRAPHY N/A 02/13/2020   Procedure: RIGHT/LEFT HEART CATH AND CORONARY ANGIOGRAPHY;  Surgeon: Jolaine Artist, MD;  Location: [facility];  Service: Cardiovascular;  Laterality: N/A;    Family History  Problem Relation Age of Onset   Hypertension Mother    Heart disease Father     Social History   Socioeconomic History   Marital status: Married    Spouse name: Not on file   Number of children: Not on file   Years of education: Not on file   Highest education level: Not on file  Occupational History    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  Tobacco Use   Smoking status: Never   Smokeless tobacco: Never  Vaping Use   Vaping Use: Never used  Substance and Sexual Activity   Alcohol use: Yes    Comment: 1 gallon of vodka lasts 2 weeks   Drug use: Never   Sexual activity: Not on file  Other Topics Concern   Not on file  Social History Narrative   Not on file   Social Determinants of Health   Financial Resource Strain: Not on file  Food Insecurity: Not on file  Transportation Needs: Not on file  Physical Activity: Not on file  Stress: Not on file  Social Connections: Not on  file  Intimate Partner Violence: Not on file    Outpatient Medications Prior to Visit  Medication Sig Dispense Refill   carvedilol (COREG) 6.25 MG tablet Take 1 tablet (6.25 mg total) by mouth 2 (two) times daily. 180 tablet 3   FARXIGA 10 MG TABS tablet TAKE 1 TABLET BY MOUTH EVERY DAY BEFORE BREAKFAST 30 tablet 4   sacubitril-valsartan (ENTRESTO) 97-103 MG Take 1 tablet by mouth 2 (two) times daily. 180 tablet 3   spironolactone (ALDACTONE) 25 MG tablet Take 1 tablet (25 mg total) by mouth daily. 30 tablet 5   furosemide (LASIX) 40 MG tablet Take 1 tablet (40 mg total) by mouth daily as needed. For weight 193 lbs or greater (Patient not taking: Reported on 09/29/2020) 30 tablet 5   No facility-administered medications prior to visit.    No Known Allergies  Review of Systems  Constitutional: Negative.   Respiratory: Negative.    Cardiovascular: Negative.   Musculoskeletal: Negative.   Psychiatric/Behavioral: Negative.        Objective:    Physical Exam Constitutional:      Appearance: Normal appearance.  Cardiovascular:     Rate and Rhythm: Normal rate and regular rhythm.     Pulses: Normal pulses.     Heart sounds:  Normal heart sounds.  Pulmonary:     Effort: Pulmonary effort is normal.     Breath sounds: Normal breath sounds.  Musculoskeletal:        General: Normal range of motion.  Neurological:     Mental Status: He is alert.  Psychiatric:        Mood and Affect: Mood normal.        Behavior: Behavior normal.        Thought Content: Thought content normal.        Judgment: Judgment normal.    BP 125/74 (BP Location: Left Arm, Patient Position: Sitting, Cuff Size: Large)   Pulse 73   Temp 98.2 F (36.8 C) (Oral)   Ht $R'5\' 8"'Ag$  (1.727 m)   Wt 217 lb (98.4 kg)   SpO2 98%   BMI 32.99 kg/m  Wt Readings from Last 3 Encounters:  09/29/20 217 lb (98.4 kg)  08/27/20 215 lb 3.2 oz (97.6 kg)  07/09/20 214 lb 6.4 oz (97.3 kg)    Health Maintenance Due  Topic Date  Due   Pneumococcal Vaccine 36-37 Years old (1 - PCV) Never done   COLONOSCOPY (Pts 45-76yrs Insurance coverage will need to be confirmed)  Never done   Zoster Vaccines- Shingrix (1 of 2) Never done    There are no preventive care reminders to display for this patient.   Lab Results  Component Value Date   TSH 3.364 02/10/2020   Lab Results  Component Value Date   WBC 6.3 07/09/2020   HGB 16.2 07/09/2020   HCT 47.2 07/09/2020   MCV 93.5 07/09/2020   PLT 243 07/09/2020   Lab Results  Component Value Date   NA 136 07/09/2020   K 4.0 07/09/2020   CO2 30 07/09/2020   GLUCOSE 102 (H) 07/09/2020   BUN 10 07/09/2020   CREATININE 1.08 07/09/2020   BILITOT 1.3 (H) 02/08/2020   ALKPHOS 120 02/08/2020   AST 48 (H) 02/08/2020   ALT 43 02/08/2020   PROT 6.2 (L) 02/08/2020   ALBUMIN 2.9 (L) 02/08/2020   CALCIUM 9.5 07/09/2020   ANIONGAP 5 07/09/2020   EGFR 83 05/25/2020   Lab Results  Component Value Date   CHOL 150 01/22/2020   Lab Results  Component Value Date   HDL 38 (L) 01/22/2020   Lab Results  Component Value Date   LDLCALC 96 01/22/2020   Lab Results  Component Value Date   TRIG 84 01/22/2020   No results found for: CHOLHDL No results found for: HGBA1C     Assessment & Plan:   Problem List Items Addressed This Visit       Cardiovascular and Mediastinum   Chronic systolic heart failure ([place]) - Primary    -had June echo with Bensimhon, LVEF up to 25-30% -continue medications per cardiology -he is taking lasix PRN if his weight gets to around 220#      Relevant Orders   CMP14+EGFR   CBC with Differential/Platelet   Lipid Panel With LDL/HDL Ratio   CBC with Differential/Platelet   CMP14+EGFR   Lipid Panel With LDL/HDL Ratio     Endocrine   IFG (impaired fasting glucose)    -check A1c      Relevant Orders   Hemoglobin A1c   Hemoglobin A1c     Other   Alcohol abuse    -stopped drinking with onset of heart issues -encouraged to continue  this        No orders of the defined types  were placed in this encounter.    Noreene Larsson, NP

## 2020-09-30 ENCOUNTER — Other Ambulatory Visit: Payer: Self-pay | Admitting: Nurse Practitioner

## 2020-09-30 DIAGNOSIS — E782 Mixed hyperlipidemia: Secondary | ICD-10-CM

## 2020-09-30 LAB — LIPID PANEL WITH LDL/HDL RATIO
Cholesterol, Total: 304 mg/dL — ABNORMAL HIGH (ref 100–199)
HDL: 47 mg/dL (ref >39)
LDL Chol Calc (NIH): 210 mg/dL — ABNORMAL HIGH (ref 0–99)
LDL/HDL Ratio: 4.5 ratio — ABNORMAL HIGH (ref 0.0–3.6)
Triglycerides: 239 mg/dL — ABNORMAL HIGH (ref 0–149)
VLDL Cholesterol Cal: 47 mg/dL — ABNORMAL HIGH (ref 5–40)

## 2020-09-30 LAB — CMP14+EGFR
ALT: 24 IU/L (ref 0–44)
AST: 24 IU/L (ref 0–40)
Albumin/Globulin Ratio: 1.7 (ref 1.2–2.2)
Albumin: 4.5 g/dL (ref 3.8–4.9)
Alkaline Phosphatase: 61 IU/L (ref 44–121)
BUN/Creatinine Ratio: 7 — ABNORMAL LOW (ref 9–20)
BUN: 8 mg/dL (ref 6–24)
Bilirubin Total: 0.8 mg/dL (ref 0.0–1.2)
CO2: 25 mmol/L (ref 20–29)
Calcium: 9.7 mg/dL (ref 8.7–10.2)
Chloride: 98 mmol/L (ref 96–106)
Creatinine, Ser: 1.11 mg/dL (ref 0.76–1.27)
Globulin, Total: 2.7 g/dL (ref 1.5–4.5)
Glucose: 103 mg/dL — ABNORMAL HIGH (ref 65–99)
Potassium: 4.3 mmol/L (ref 3.5–5.2)
Sodium: 137 mmol/L (ref 134–144)
Total Protein: 7.2 g/dL (ref 6.0–8.5)
eGFR: 80 mL/min/{1.73_m2} (ref >59)

## 2020-09-30 LAB — CBC WITH DIFFERENTIAL/PLATELET
Basophils Absolute: 0 10*3/uL (ref 0.0–0.2)
Basos: 0 %
EOS (ABSOLUTE): 0.1 10*3/uL (ref 0.0–0.4)
Eos: 1 %
Hematocrit: 44.6 % (ref 37.5–51.0)
Hemoglobin: 15.5 g/dL (ref 13.0–17.7)
Immature Grans (Abs): 0 10*3/uL (ref 0.0–0.1)
Immature Granulocytes: 0 %
Lymphocytes Absolute: 1.5 10*3/uL (ref 0.7–3.1)
Lymphs: 24 %
MCH: 32.4 pg (ref 26.6–33.0)
MCHC: 34.8 g/dL (ref 31.5–35.7)
MCV: 93 fL (ref 79–97)
Monocytes Absolute: 0.6 10*3/uL (ref 0.1–0.9)
Monocytes: 10 %
Neutrophils Absolute: 4.1 10*3/uL (ref 1.4–7.0)
Neutrophils: 65 %
Platelets: 226 10*3/uL (ref 150–450)
RBC: 4.79 x10E6/uL (ref 4.14–5.80)
RDW: 12.2 % (ref 11.6–15.4)
WBC: 6.3 10*3/uL (ref 3.4–10.8)

## 2020-09-30 LAB — HEMOGLOBIN A1C
Est. average glucose Bld gHb Est-mCnc: 105 mg/dL
Hgb A1c MFr Bld: 5.3 % (ref 4.8–5.6)

## 2020-09-30 MED ORDER — ROSUVASTATIN CALCIUM 40 MG PO TABS: 40.0000 mg | ORAL_TABLET | Freq: Every day | ORAL | 3 refills | Status: DC

## 2020-09-30 NOTE — Progress Notes (Signed)
I also added A1c to his labs for slightly elevated fasting glucose.

## 2020-09-30 NOTE — Progress Notes (Signed)
His cholesterol is significantly elevated. I sent in rosuvastatin for him. Please make sure he has a lab f/u in 3 months.

## 2020-10-01 ENCOUNTER — Ambulatory Visit: Payer: Managed Care, Other (non HMO) | Admitting: Nurse Practitioner

## 2020-10-14 DIAGNOSIS — Z0279 Encounter for issue of other medical certificate: Secondary | ICD-10-CM

## 2020-10-18 NOTE — Telephone Encounter (Signed)
Contacted patient, faxed forms to [facility] .  Patient will pick a copy.

## 2020-11-07 NOTE — Progress Notes (Addendum)
Advanced Heart Failure Clinic Note  PCP: Bjorn Pippin, NP Primary Cardilogist: Dr. Diona Browner HF Cardiologist: Dr. Gala Romney  HPI: Craig Hanson is a 50 y.o. male with a hx of EtOH, chronic systolic HF, and pericardial effusion.     Admitted 02/06/20 with acute heart failure to [facility]. Transferred to  [facility] for further management and R/LHC which showed minimal nonobstructive CAD, severe NICM EF <20% and markedly elevated filling pressures w/ preserved CO.1/29, co-ox dropped to 43% and milrinone added. Diuresed ~60 lb. Able to wean off milrinone w/ stable co-ox 63%. Started on GDMT and tolerated well. cMRI showed severe biventricular dysfunction c/w NICM. Discharge weight 183 lbs.  Saw HF Pharmacist on 03/23/20. Entresto increased to goal dose and corlanor was increased to 7.5 mg bid. He started higher dose of entresto on 3/21 and took both doses and had some dizziness. He took one more dose on 3/22 but then stopped due to dizziness.     Echo 6/22 showed EF 35-30%, carvedilol increased, dig stopped with NYHA II symptoms and referred to EP for ICD.  Saw Dr. Graciela Husbands 8/22, no role for ICD with Class I symptoms.  Today he returns for HF follow up. Overall feeling fine. He gets SOB going up and down stairs and has some dizziness with standing up quickly. Denies CP, palpitations, edema, or PND/Orthopnea. Appetite ok. No fever or chills. Weight at home 215-217 pounds. Taking all medications. Drinking 1-2 ETOH drinks every couple of days. Has not used PRN lasix.  Cardiac Studies: Echo (6/22): 25-30%, severe LV dysfunction, Grade I DD, RV ok, small pericardial effusion.  cMRI (2/22): severe biventricular dysfunction and NICM. Moderate to large effusion. No tamponade.  R/LHC 02/13/20: Ost LAD to Prox LAD lesion is 20% stenosed, Ost Cx to Prox Cx lesion is 20% stenosed. Findings:  Ao = 110/84 (95) LV = 106/33 RA =  20 RV = 59/29 PA = 62/20 (43) PCW = 38 Fick cardiac output/index = 5.1/2.3 SVR =  1186 PVR = 1.0 WU FA sat = 96% PA sat = 64%, 68% SVC sat = 66% PAPi = 2.1   Assessment: 1. Minimal nonobstructive CAD 2. 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 Markedly elevated filling pressures with preserved cardiac output   Echo (02/07/20): EF 20-25%, global hypokinesis, grade III dd, severe RV dysfunction, mod LAE, moderate pericardial effusion  ROS: All systems negative except as listed in HPI, PMH and Problem List.  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  Tobacco Use   Smoking status: Never   Smokeless tobacco: Never  Vaping Use   Vaping Use: Never used  Substance and Sexual Activity   Alcohol use: Yes    Comment: 1 gallon of vodka lasts 2 weeks   Drug use: Never   Sexual activity: Not on file  Other Topics Concern   Not on file  Social History Narrative   Not on file   Social Determinants of Health   Financial Resource Strain: Not on file  Food Insecurity: Not on file  Transportation Needs: Not on file  Physical Activity: Not on file  Stress: Not on file  Social Connections: Not on file  Intimate Partner  Violence: Not on file   FH:  Family History  Problem Relation Age of Onset   Hypertension Mother    Heart disease Father    Past Medical History:  Diagnosis Date   Acute CHF (congestive heart failure) ([facility]) 02/06/2020   Asthma    Pericardial effusion    Systolic HF (heart failure) ([facility])    Current Outpatient Medications  Medication Sig Dispense Refill   carvedilol (COREG) 6.25 MG tablet Take 1 tablet (6.25 mg total) by mouth 2 (two) times daily. 180 tablet 3   FARXIGA 10 MG TABS tablet TAKE 1 TABLET BY MOUTH EVERY DAY BEFORE BREAKFAST 30 tablet 4   furosemide (LASIX) 40 MG tablet Take 1 tablet (40 mg total) by mouth daily as  needed. For weight 193 lbs or greater 30 tablet 5   rosuvastatin (CRESTOR) 40 MG tablet Take 1 tablet (40 mg total) by mouth daily. 90 tablet 3   sacubitril-valsartan (ENTRESTO) 97-103 MG Take 1 tablet by mouth 2 (two) times daily. 180 tablet 3   spironolactone (ALDACTONE) 25 MG tablet Take 1 tablet (25 mg total) by mouth daily. 30 tablet 5   No current facility-administered medications for this encounter.   BP 108/70   Pulse 83   Wt 97.9 kg (215 lb 12.8 oz)   SpO2 98%   BMI 32.81 kg/m   Wt Readings from Last 3 Encounters:  11/08/20 97.9 kg (215 lb 12.8 oz)  09/29/20 98.4 kg (217 lb)  08/27/20 97.6 kg (215 lb 3.2 oz)   PHYSICAL EXAM: General:  NAD. No resp difficulty HEENT: Normal Neck: Supple. No JVD. Carotids 2+ bilat; no bruits. No lymphadenopathy or thryomegaly appreciated. Cor: PMI nondisplaced. Regular rate & rhythm. No rubs, gallops or murmurs. Lungs: Clear Abdomen: Soft, nontender, nondistended. No hepatosplenomegaly. No bruits or masses. Good bowel sounds. Extremities: No cyanosis, clubbing, rash, edema Neuro: Alert & oriented x 3, cranial nerves grossly intact. Moves all 4 extremities w/o difficulty. Affect pleasant.  ReDs: 40%  ASSESSMENT & PLAN: 1. Chronic systolic HF with severe biventricular dysfunction due to NICM - 01/2020 Echo EF 20% severe RV dysfunction moderate pericardial effusion, suspect ETOH related stopped drinking 3 months ago.  - Cath 02/13/20 with minimal CAD and massively volume overload. - cMRI EF 17% RV Severe dysfunction, ? Acute myocarditis possible sarcoid. Wall thinning.   - Echo 07/09/20 with EF 25-30% - NYHA II. Euvolemic today, REDS 40% which is discordant with physical exam; he has muscular upper body. - Keep lasix 40 mg PRN for acute weight gain and edema. - Continue Farxiga 10 mg daily. - Continue Entresto 97-103 mg bid. - Continue spironolactone 25 mg daily. - Continue Coreg 6.25 mg bid. - Off digoxin. - Will update echo - BMET and  BNP today.  2. H/O pericardial effusion - Very small on echo 6/22.   3. ETOH abuse - Drinking a couple drinks every other day. - Discussed cutting back/quitting.  4. HTN  - Stable. - Continue current medications.  5. HLD - Continue Crestor 40. LDL 210 (9/22). - Lipids followed by PCP.  Follow up with Dr. Gala Romney + echo in 4 months.  Anderson Malta [facility]  FNP 11/08/20

## 2020-11-08 ENCOUNTER — Ambulatory Visit (HOSPITAL_COMMUNITY)
Admission: RE | Admit: 2020-11-08 | Discharge: 2020-11-08 | Disposition: A | Discharge status: Home / Self Care | Payer: 59 | Source: Ambulatory Visit | Attending: Family Medicine | Admitting: Family Medicine

## 2020-11-08 ENCOUNTER — Encounter (HOSPITAL_COMMUNITY): Payer: Self-pay

## 2020-11-08 ENCOUNTER — Other Ambulatory Visit: Payer: Self-pay

## 2020-11-08 VITALS — BP 108/70 | HR 83 | Wt 215.8 lb

## 2020-11-08 DIAGNOSIS — Z79899 Other long term (current) drug therapy: Secondary | ICD-10-CM | POA: Diagnosis not present

## 2020-11-08 DIAGNOSIS — E785 Hyperlipidemia, unspecified: Secondary | ICD-10-CM | POA: Diagnosis not present

## 2020-11-08 DIAGNOSIS — I5022 Chronic systolic (congestive) heart failure: Secondary | ICD-10-CM | POA: Diagnosis not present

## 2020-11-08 DIAGNOSIS — I10 Essential (primary) hypertension: Secondary | ICD-10-CM | POA: Diagnosis not present

## 2020-11-08 DIAGNOSIS — Z7984 Long term (current) use of oral hypoglycemic drugs: Secondary | ICD-10-CM | POA: Diagnosis not present

## 2020-11-08 DIAGNOSIS — F1010 Alcohol abuse, uncomplicated: Secondary | ICD-10-CM | POA: Diagnosis not present

## 2020-11-08 DIAGNOSIS — I110 Hypertensive heart disease with heart failure: Secondary | ICD-10-CM | POA: Insufficient documentation

## 2020-11-08 DIAGNOSIS — I428 Other cardiomyopathies: Secondary | ICD-10-CM | POA: Insufficient documentation

## 2020-11-08 DIAGNOSIS — Z8249 Family history of ischemic heart disease and other diseases of the circulatory system: Secondary | ICD-10-CM | POA: Diagnosis not present

## 2020-11-08 DIAGNOSIS — I3139 Other pericardial effusion (noninflammatory): Secondary | ICD-10-CM | POA: Diagnosis not present

## 2020-11-08 DIAGNOSIS — I2510 Atherosclerotic heart disease of native coronary artery without angina pectoris: Secondary | ICD-10-CM | POA: Insufficient documentation

## 2020-11-08 LAB — BASIC METABOLIC PANEL
Anion gap: 9 (ref 5–15)
BUN: 7 mg/dL (ref 6–20)
CO2: 27 mmol/L (ref 22–32)
Calcium: 9.2 mg/dL (ref 8.9–10.3)
Chloride: 101 mmol/L (ref 98–111)
Creatinine, Ser: 1.16 mg/dL (ref 0.61–1.24)
GFR, Estimated: >60 mL/min (ref >60)
Glucose, Bld: 103 mg/dL — ABNORMAL HIGH (ref 70–99)
Potassium: 3.7 mmol/L (ref 3.5–5.1)
Sodium: 137 mmol/L (ref 135–145)

## 2020-11-08 LAB — BRAIN NATRIURETIC PEPTIDE: B Natriuretic Peptide: 161.4 pg/mL — ABNORMAL HIGH (ref 0.0–100.0)

## 2020-11-08 NOTE — Patient Instructions (Addendum)
Labs were done today, if any labs are abnormal the clinic will call you  Your physician recommends that you schedule a follow-up appointment in: 4 months with echocardiogram   At the [facility], you and your health needs are our priority. As part of our continuing mission to provide you with exceptional heart care, we have created designated Provider Care Teams. These Care Teams include your primary Cardiologist (physician) and Advanced Practice Providers (APPs- Physician Assistants and Nurse Practitioners) who all work together to provide you with the care you need, when you need it.   You may see any of the following providers on your designated Care Team at your next follow up: Dr Arvilla Meres Dr Carron Curie, NP Robbie Lis, [place] [facility], [place] Karle Plumber, PharmD   Please be sure to bring in all your medications bottles to every appointment.    If you have any questions or concerns before your next appointment please send [place] a message through [place] or call our office at [phone].    TO LEAVE A MESSAGE FOR THE NURSE SELECT OPTION 2, PLEASE LEAVE A MESSAGE INCLUDING: YOUR NAME DATE OF BIRTH CALL BACK NUMBER REASON FOR CALL**this is important as we prioritize the call backs  YOU WILL RECEIVE A CALL BACK THE SAME DAY AS LONG AS YOU CALL BEFORE 4:00 PM

## 2020-11-08 NOTE — Progress Notes (Signed)
ReDS Vest / Clip - 11/08/20 0900       ReDS Vest / Clip   Station Marker C    Ruler Value 30    ReDS Value Range Moderate volume overload    ReDS Actual Value 40

## 2020-12-11 ENCOUNTER — Other Ambulatory Visit: Payer: Self-pay | Admitting: Cardiology

## 2021-01-06 ENCOUNTER — Other Ambulatory Visit: Payer: Self-pay | Admitting: Cardiology

## 2021-01-07 ENCOUNTER — Encounter (HOSPITAL_COMMUNITY): Payer: Self-pay | Admitting: *Deleted

## 2021-01-07 NOTE — Progress Notes (Signed)
Disability forms completed, signed by Dr Gala Romney, and faxed along with last OV to [facility] atten: Dirk Dress at [phone]

## 2021-03-02 ENCOUNTER — Encounter (HOSPITAL_COMMUNITY): Payer: Managed Care, Other (non HMO) | Admitting: Internal Medicine

## 2021-03-02 ENCOUNTER — Ambulatory Visit (HOSPITAL_COMMUNITY): Payer: Managed Care, Other (non HMO)

## 2021-03-22 ENCOUNTER — Encounter (HOSPITAL_COMMUNITY): Payer: Self-pay

## 2021-03-22 ENCOUNTER — Ambulatory Visit (HOSPITAL_COMMUNITY)
Admission: RE | Admit: 2021-03-22 | Discharge: 2021-03-22 | Disposition: A | Discharge status: Home / Self Care | Payer: 59 | Source: Ambulatory Visit | Attending: Family Medicine | Admitting: Family Medicine

## 2021-03-22 ENCOUNTER — Other Ambulatory Visit: Payer: Self-pay

## 2021-03-22 ENCOUNTER — Ambulatory Visit (HOSPITAL_BASED_OUTPATIENT_CLINIC_OR_DEPARTMENT_OTHER)
Admission: RE | Admit: 2021-03-22 | Discharge: 2021-03-22 | Disposition: A | Discharge status: Home / Self Care | Payer: 59 | Source: Ambulatory Visit | Attending: Family Medicine | Admitting: Family Medicine

## 2021-03-22 VITALS — BP 106/68 | HR 73 | Wt 229.4 lb

## 2021-03-22 DIAGNOSIS — I5022 Chronic systolic (congestive) heart failure: Secondary | ICD-10-CM

## 2021-03-22 DIAGNOSIS — E785 Hyperlipidemia, unspecified: Secondary | ICD-10-CM | POA: Diagnosis not present

## 2021-03-22 DIAGNOSIS — I10 Essential (primary) hypertension: Secondary | ICD-10-CM | POA: Diagnosis not present

## 2021-03-22 DIAGNOSIS — Z7984 Long term (current) use of oral hypoglycemic drugs: Secondary | ICD-10-CM | POA: Diagnosis not present

## 2021-03-22 DIAGNOSIS — I3139 Other pericardial effusion (noninflammatory): Secondary | ICD-10-CM

## 2021-03-22 DIAGNOSIS — I2510 Atherosclerotic heart disease of native coronary artery without angina pectoris: Secondary | ICD-10-CM | POA: Insufficient documentation

## 2021-03-22 DIAGNOSIS — I409 Acute myocarditis, unspecified: Secondary | ICD-10-CM | POA: Diagnosis not present

## 2021-03-22 DIAGNOSIS — I428 Other cardiomyopathies: Secondary | ICD-10-CM | POA: Insufficient documentation

## 2021-03-22 DIAGNOSIS — I110 Hypertensive heart disease with heart failure: Secondary | ICD-10-CM | POA: Insufficient documentation

## 2021-03-22 DIAGNOSIS — Z79899 Other long term (current) drug therapy: Secondary | ICD-10-CM | POA: Insufficient documentation

## 2021-03-22 DIAGNOSIS — Z7901 Long term (current) use of anticoagulants: Secondary | ICD-10-CM | POA: Diagnosis not present

## 2021-03-22 DIAGNOSIS — F1010 Alcohol abuse, uncomplicated: Secondary | ICD-10-CM | POA: Diagnosis not present

## 2021-03-22 DIAGNOSIS — R42 Dizziness and giddiness: Secondary | ICD-10-CM | POA: Diagnosis present

## 2021-03-22 LAB — ECHOCARDIOGRAM COMPLETE
AR max vel: 2.19 cm2
AV Area VTI: 1.95 cm2
AV Area mean vel: 2.07 cm2
AV Mean grad: 2 mmHg
AV Peak grad: 4.3 mmHg
Ao pk vel: 1.04 m/s
Calc EF: 48 %
S' Lateral: 5.1 cm
Single Plane A2C EF: 46.1 %
Single Plane A4C EF: 56.3 %

## 2021-03-22 LAB — LIPID PANEL
Cholesterol: 175 mg/dL (ref 0–200)
HDL: 50 mg/dL (ref >40)
LDL Cholesterol: 94 mg/dL (ref 0–99)
Total CHOL/HDL Ratio: 3.5 RATIO
Triglycerides: 154 mg/dL — ABNORMAL HIGH (ref <150)
VLDL: 31 mg/dL (ref 0–40)

## 2021-03-22 LAB — BASIC METABOLIC PANEL
Anion gap: 6 (ref 5–15)
BUN: 8 mg/dL (ref 6–20)
CO2: 28 mmol/L (ref 22–32)
Calcium: 9.6 mg/dL (ref 8.9–10.3)
Chloride: 102 mmol/L (ref 98–111)
Creatinine, Ser: 1.13 mg/dL (ref 0.61–1.24)
GFR, Estimated: >60 mL/min (ref >60)
Glucose, Bld: 108 mg/dL — ABNORMAL HIGH (ref 70–99)
Potassium: 4.2 mmol/L (ref 3.5–5.1)
Sodium: 136 mmol/L (ref 135–145)

## 2021-03-22 MED ORDER — PERFLUTREN LIPID MICROSPHERE
1.0000 mL | INTRAVENOUS | Status: DC | PRN
  Administered 2021-03-22: 2 mL via INTRAVENOUS
  Filled 2021-03-22: qty 10

## 2021-03-22 NOTE — Patient Instructions (Signed)
Thank you for coming in today ? ?Labs were done today, if any labs are abnormal the clinic will call you ? ?EKG was done today ? ?Continue Lasix 40 mg daily  ? ?Your physician recommends that you schedule a follow-up appointment in:  ?4 months with Dr. Gala Romney ? ?At the [facility], you and your health needs are our priority. As part of our continuing mission to provide you with exceptional heart care, we have created designated Provider Care Teams. These Care Teams include your primary Cardiologist (physician) and Advanced Practice Providers (APPs- Physician Assistants and Nurse Practitioners) who all work together to provide you with the care you need, when you need it.  ? ?You may see any of the following providers on your designated Care Team at your next follow up: ?Dr Arvilla Meres ?Dr Marca Ancona ?Tonye Becket, NP ?Robbie Lis, PA ?Jessica [place]ford,NP ?Anna Genre, PA ?Karle Plumber, PharmD ? ? ?Please be sure to bring in all your medications bottles to every appointment.  ? ?If you have any questions or concerns before your next appointment please send [place] a message through [place] or call our office at [phone].   ? ?TO LEAVE A MESSAGE FOR THE NURSE SELECT OPTION 2, PLEASE LEAVE A MESSAGE INCLUDING: ?YOUR NAME ?DATE OF BIRTH ?CALL BACK NUMBER ?REASON FOR CALL**this is important as we prioritize the call backs ? ?YOU WILL RECEIVE A CALL BACK THE SAME DAY AS LONG AS YOU CALL BEFORE 4:00 PM ? ?

## 2021-03-22 NOTE — Progress Notes (Signed)
?  Echocardiogram ?2D Echocardiogram with contrast has been performed. ? ?Craig Hanson ?03/22/2021, 10:21 AM ?

## 2021-03-22 NOTE — Progress Notes (Signed)
ReDS Vest / Clip - 03/22/21 1000   ? ?  ? ReDS Vest / Clip  ? Station Marker D   ? Ruler Value 39   ? ReDS Value Range Moderate volume overload   ? ReDS Actual Value 38   ? ?  ?  ? ?  ? ? ?

## 2021-03-22 NOTE — Progress Notes (Signed)
? ?        Advanced Heart Failure Clinic Note  ?PCP: Bjorn Pippin, NP ?Primary Cardilogist: Dr. Diona Browner ?HF Cardiologist: Dr. Gala Romney ? ?HPI: ?Craig Hanson is a 51 y.o. male with a hx of EtOH, chronic systolic HF, and pericardial effusion.   ?  ?Admitted 02/06/20 with acute heart failure to [facility]. Transferred to  [facility] for further management and R/LHC which showed minimal nonobstructive CAD, severe NICM EF <20% and markedly elevated filling pressures w/ preserved CO.1/29, co-ox dropped to 43% and milrinone added. Diuresed ~60 lb. Able to wean off milrinone w/ stable co-ox 63%. Started on GDMT and tolerated well. cMRI showed severe biventricular dysfunction c/w NICM. Discharge weight 183 lbs. ? ?Saw HF Pharmacist on 03/23/20. Entresto increased to goal dose and corlanor was increased to 7.5 mg bid. He started higher dose of entresto on 3/21 and took both doses and had some dizziness. He took one more dose on 3/22 but then stopped due to dizziness.    ? ?Echo 6/22 showed EF 35-30%, carvedilol increased, dig stopped with NYHA II symptoms and referred to EP for ICD. ? ?Saw Dr. Graciela Husbands 8/22, no role for ICD with Class I symptoms. ? ?Echo today 03/22/21, results pending. ? ?Today he returns for HF follow up. Overall feeling fine. Gets SOB and fatigued doing yardwork, and has to stop and rest then can continue. Has some dizziness if he stands up quickly but no falls. Denies abnormal bleeding, CP, edema, or PND/Orthopnea. Appetite ok. No fever or chills. Weight at home 225 pounds. Taking all medications. Drinking 1-2 ETOH drinks every couple of days. Has been taking Lasix 40 daily. Tries to do pushups to stay active, likes to work on cars.  ? ? ?Cardiac Studies: ?Echo (6/22): 25-30%, severe LV dysfunction, Grade I DD, RV ok, small pericardial effusion. ? ?cMRI (2/22): severe biventricular dysfunction and NICM. Moderate to large effusion. No tamponade. ? ?R/LHC 02/13/20: Ost LAD to Prox LAD lesion is 20% stenosed, Ost Cx to Prox Cx  lesion is 20% stenosed. ?Findings:  ?Ao = 110/84 (95) ?LV = 106/33 ?RA =  20 ?RV = 59/29 ?PA = 62/20 (43) ?PCW = 38 ?Fick cardiac output/index = 5.1/2.3 ?SVR = 1186 ?PVR = 1.0 WU ?FA sat = 96% ?PA sat = 64%, 68% ?SVC sat = 66% ?PAPi = 2.1   ?Assessment: ?1. Minimal nonobstructive CAD ?2. Severe NICM EF < 20% ?3. Markedly elevated filling pressures with preserved cardiac output  ? ?Echo (02/07/20): EF 20-25%, global hypokinesis, grade III dd, severe RV dysfunction, mod LAE, moderate pericardial effusion ? ?ROS: All systems negative except as listed in HPI, PMH and Problem List. ? ?SH:  ?Social History  ? ?Socioeconomic History  ? Marital status: Married  ?  Spouse name: Not on file  ? Number of children: Not on file  ? Years of education: Not on file  ? Highest education level: Not on file  ?Occupational History  ?  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 Smoking status: Never  ? Smokeless tobacco: Never  ?Vaping Use  ? Vaping Use: Never used  ?Substance and Sexual Activity  ? Alcohol use: Yes  ?  Comment: 1 gallon of vodka lasts 2 weeks  ? Drug use: Never  ? Sexual activity: Not on file  ?Other Topics Concern  ? Not on file  ?Social History Narrative  ? Not on file  ? ?Social Determinants of Health  ? ?Financial Resource Strain: Not on file  ?  Food Insecurity: Not on file  ?Transportation Needs: Not on file  ?Physical Activity: Not on file  ?Stress: Not on file  ?Social Connections: Not on file  ?Intimate Partner Violence: Not on file  ? ?FH:  ?Family History  ?Problem Relation Age of Onset  ? Hypertension Mother   ? Heart disease Father   ? ?Past Medical History:  ?Diagnosis Date  ? Acute CHF (congestive heart failure) ([facility]) 02/06/2020  ? Asthma   ? Pericardial effusion   ? Systolic HF (heart failure) ([facility])   ? ?Current Outpatient Medications  ?Medication Sig Dispense Refill  ? carvedilol (COREG) 6.25 MG tablet Take 1 tablet (6.25 mg total) by mouth 2 (two) times daily. 180 tablet 3  ? FARXIGA  10 MG TABS tablet TAKE 1 TABLET BY MOUTH EVERY DAY BEFORE BREAKFAST 30 tablet 4  ? furosemide (LASIX) 40 MG tablet Take 1 tablet (40 mg total) by mouth daily as needed. For weight 193 lbs or greater 30 tablet 5  ? rosuvastatin (CRESTOR) 40 MG tablet Take 1 tablet (40 mg total) by mouth daily. 90 tablet 3  ? sacubitril-valsartan (ENTRESTO) 97-103 MG Take 1 tablet by mouth 2 (two) times daily. 180 tablet 3  ? spironolactone (ALDACTONE) 25 MG tablet TAKE 1 TABLET BY MOUTH EVERY DAY 90 tablet 2  ? ?No current facility-administered medications for this encounter.  ? ?Facility-Administered Medications Ordered in Other Encounters  ?Medication Dose Route Frequency Provider Last Rate Last Admin  ? perflutren lipid microspheres (DEFINITY) IV suspension  1-10 mL Intravenous PRN Jacklynn Ganong, FNP   2 mL at 03/22/21 1020  ? ?BP 106/68   Pulse 73   Wt 104.1 kg (229 lb 6.4 oz)   SpO2 97%   BMI 34.88 kg/m?  ? ?Wt Readings from Last 3 Encounters:  ?03/22/21 104.1 kg (229 lb 6.4 oz)  ?11/08/20 97.9 kg (215 lb 12.8 oz)  ?09/29/20 98.4 kg (217 lb)  ? ?PHYSICAL EXAM: ?General:  NAD. No resp difficulty ?HEENT: Normal ?Neck: Supple. No JVD. Carotids 2+ bilat; no bruits. No lymphadenopathy or thryomegaly appreciated. ?Cor: PMI nondisplaced. Regular rate & rhythm. No rubs, gallops or murmurs. ?Lungs: Clear ?Abdomen: Soft, nontender, nondistended. No hepatosplenomegaly. No bruits or masses. Good bowel sounds. ?Extremities: No cyanosis, clubbing, rash, edema ?Neuro: Alert & oriented x 3, cranial nerves grossly intact. Moves all 4 extremities w/o difficulty. Affect pleasant. ? ?ReDs: 38% ? ?ECG: NSR LBBB qrs 148 msec (personally reviewed). ? ?ASSESSMENT & PLAN: ?1. Chronic systolic HF with severe biventricular dysfunction due to NICM ?- 01/2020 Echo EF 20% severe RV dysfunction moderate pericardial effusion, suspect ETOH related stopped drinking 3 months ago.  ?- Cath 02/13/20 with minimal CAD and massively volume overload. ?- cMRI EF  17% RV Severe dysfunction, ? Acute myocarditis possible sarcoid. Wall thinning.   ?- Echo 07/09/20 with EF 25-30% ?- Echo today 03/22/21, results pending. Await echo results, if EF down, consider CPX. We discussed rationale today. ?- NYHA II. Euvolemic today, REDS 38% which is discordant with physical exam; he has muscular upper body. ?- He has been taking Lasix 40 mg daily, OK to continue this.  ?- Continue Farxiga 10 mg daily. ?- Continue Entresto 97-103 mg bid. ?- Continue spironolactone 25 mg daily. ?- Continue Coreg 6.25 mg bid. ?- Off digoxin. ?- BMET today. ? ?2. H/O pericardial effusion ?- Very small on echo 6/22. ?  ?3. ETOH abuse ?- Drinking a couple drinks every other day. ?- Discussed cutting back/quitting. ? ?4. HTN  ?-  Stable. ?- Continue current medications. ? ?5. HLD ?- Continue Crestor 40. LDL 210 (9/22). ?- Lipids followed by PCP. ? ?Follow up in 3-4 months with Dr. Gala Romney. ? ?Jacklynn Ganong  FNP ?03/22/21 ? ?

## 2021-03-30 ENCOUNTER — Encounter: Payer: 59 | Admitting: Nurse Practitioner

## 2021-04-08 LAB — CMP14+EGFR
ALT: 23 IU/L (ref 0–44)
AST: 24 IU/L (ref 0–40)
Albumin/Globulin Ratio: 1.7 (ref 1.2–2.2)
Albumin: 4.7 g/dL (ref 3.8–4.9)
Alkaline Phosphatase: 69 IU/L (ref 44–121)
BUN/Creatinine Ratio: 11 (ref 9–20)
BUN: 14 mg/dL (ref 6–24)
Bilirubin Total: 0.5 mg/dL (ref 0.0–1.2)
CO2: 26 mmol/L (ref 20–29)
Calcium: 9.6 mg/dL (ref 8.7–10.2)
Chloride: 99 mmol/L (ref 96–106)
Creatinine, Ser: 1.31 mg/dL — ABNORMAL HIGH (ref 0.76–1.27)
Globulin, Total: 2.7 g/dL (ref 1.5–4.5)
Glucose: 99 mg/dL (ref 70–99)
Potassium: 4.2 mmol/L (ref 3.5–5.2)
Sodium: 138 mmol/L (ref 134–144)
Total Protein: 7.4 g/dL (ref 6.0–8.5)
eGFR: 65 mL/min/{1.73_m2} (ref >59)

## 2021-04-08 LAB — CBC WITH DIFFERENTIAL/PLATELET
Basophils Absolute: 0 10*3/uL (ref 0.0–0.2)
Basos: 0 %
EOS (ABSOLUTE): 0.1 10*3/uL (ref 0.0–0.4)
Eos: 1 %
Hematocrit: 42.7 % (ref 37.5–51.0)
Hemoglobin: 14.7 g/dL (ref 13.0–17.7)
Immature Grans (Abs): 0 10*3/uL (ref 0.0–0.1)
Immature Granulocytes: 0 %
Lymphocytes Absolute: 2.1 10*3/uL (ref 0.7–3.1)
Lymphs: 29 %
MCH: 31.5 pg (ref 26.6–33.0)
MCHC: 34.4 g/dL (ref 31.5–35.7)
MCV: 91 fL (ref 79–97)
Monocytes Absolute: 0.7 10*3/uL (ref 0.1–0.9)
Monocytes: 9 %
Neutrophils Absolute: 4.4 10*3/uL (ref 1.4–7.0)
Neutrophils: 61 %
Platelets: 207 10*3/uL (ref 150–450)
RBC: 4.67 x10E6/uL (ref 4.14–5.80)
RDW: 12.7 % (ref 11.6–15.4)
WBC: 7.2 10*3/uL (ref 3.4–10.8)

## 2021-04-08 LAB — HEMOGLOBIN A1C
Est. average glucose Bld gHb Est-mCnc: 111 mg/dL
Hgb A1c MFr Bld: 5.5 % (ref 4.8–5.6)

## 2021-04-08 LAB — LIPID PANEL WITH LDL/HDL RATIO
Cholesterol, Total: 182 mg/dL (ref 100–199)
HDL: 47 mg/dL (ref >39)
LDL Chol Calc (NIH): 100 mg/dL — ABNORMAL HIGH (ref 0–99)
LDL/HDL Ratio: 2.1 ratio (ref 0.0–3.6)
Triglycerides: 203 mg/dL — ABNORMAL HIGH (ref 0–149)
VLDL Cholesterol Cal: 35 mg/dL (ref 5–40)

## 2021-04-13 ENCOUNTER — Encounter: Payer: Self-pay | Admitting: Internal Medicine

## 2021-04-13 ENCOUNTER — Ambulatory Visit (INDEPENDENT_AMBULATORY_CARE_PROVIDER_SITE_OTHER): Payer: 59 | Admitting: Internal Medicine

## 2021-04-13 VITALS — BP 124/82 | HR 67 | Ht 67.5 in | Wt 234.4 lb

## 2021-04-13 DIAGNOSIS — M19041 Primary osteoarthritis, right hand: Secondary | ICD-10-CM | POA: Insufficient documentation

## 2021-04-13 DIAGNOSIS — Z0001 Encounter for general adult medical examination with abnormal findings: Secondary | ICD-10-CM | POA: Insufficient documentation

## 2021-04-13 DIAGNOSIS — Z23 Encounter for immunization: Secondary | ICD-10-CM | POA: Diagnosis not present

## 2021-04-13 DIAGNOSIS — Z1211 Encounter for screening for malignant neoplasm of colon: Secondary | ICD-10-CM | POA: Diagnosis not present

## 2021-04-13 DIAGNOSIS — I5022 Chronic systolic (congestive) heart failure: Secondary | ICD-10-CM

## 2021-04-13 DIAGNOSIS — M19042 Primary osteoarthritis, left hand: Secondary | ICD-10-CM

## 2021-04-13 NOTE — Progress Notes (Signed)
? ?Established Patient Office Visit ? ?Subjective:  ?Patient ID: Craig Hanson, male    DOB: 06-Aug-1970  Age: 51 y.o. MRN: [id number] ? ?CC:  ?Chief Complaint  ?Patient presents with  ? Annual Exam  ? Hand Pain  ?  Pt complains of some hand swelling.   ? ? ?HPI ?[name] is a 51 y.o. male with past medical history of HFrEF who presents for annual physical. ? ?He has been doing well overall.  Denies any recent  worsening of leg swelling or dyspnea.  Denies any chest pain, dizziness or palpitations. Followed by CHF clinic. ? ?He complains of bilateral hand swelling and pain, which is chronic and intermittent.  He attributes it to his previous job, where he had to lift heavy objects and move it around.  He has not taken any medications for it yet. ? ? ? ?Past Medical History:  ?Diagnosis Date  ? Acute CHF (congestive heart failure) ([place]) 02/06/2020  ? Asthma   ? Pericardial effusion   ? Systolic HF (heart failure) ([place])   ? ? ?Past Surgical History:  ?Procedure Laterality Date  ? No prior surgery    ? RIGHT/LEFT HEART CATH AND CORONARY ANGIOGRAPHY N/A 02/13/2020  ? Procedure: RIGHT/LEFT HEART CATH AND CORONARY ANGIOGRAPHY;  Surgeon: Jolaine Artist, MD;  Location: [facility];  Service: Cardiovascular;  Laterality: N/A;  ? ? ?Family History  ?Problem Relation Age of Onset  ? Hypertension Mother   ? Heart disease Father   ? ? ?Social History  ? ?Socioeconomic History  ? Marital status: Married  ?  Spouse name: Not on file  ? Number of children: Not on file  ? Years of education: Not on file  ? Highest education level: Not on file  ?Occupational History  ?  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 Smoking status: Never  ? Smokeless tobacco: Never  ?Vaping Use  ? Vaping Use: Never used  ?Substance and Sexual Activity  ? Alcohol use: Yes  ?  Comment: 1 gallon of vodka lasts 2 weeks  ? Drug use: Never  ? Sexual activity: Not on file  ?Other Topics Concern  ? Not on file  ?Social  History Narrative  ? Not on file  ? ?Social Determinants of Health  ? ?Financial Resource Strain: Not on file  ?Food Insecurity: Not on file  ?Transportation Needs: Not on file  ?Physical Activity: Not on file  ?Stress: Not on file  ?Social Connections: Not on file  ?Intimate Partner Violence: Not on file  ? ? ?Outpatient Medications Prior to Visit  ?Medication Sig Dispense Refill  ? carvedilol (COREG) 6.25 MG tablet Take 1 tablet (6.25 mg total) by mouth 2 (two) times daily. 180 tablet 3  ? FARXIGA 10 MG TABS tablet TAKE 1 TABLET BY MOUTH EVERY DAY BEFORE BREAKFAST 30 tablet 4  ? furosemide (LASIX) 40 MG tablet Take 1 tablet (40 mg total) by mouth daily as needed. For weight 193 lbs or greater 30 tablet 5  ? rosuvastatin (CRESTOR) 40 MG tablet Take 1 tablet (40 mg total) by mouth daily. 90 tablet 3  ? sacubitril-valsartan (ENTRESTO) 97-103 MG Take 1 tablet by mouth 2 (two) times daily. 180 tablet 3  ? spironolactone (ALDACTONE) 25 MG tablet TAKE 1 TABLET BY MOUTH EVERY DAY 90 tablet 2  ? ?No facility-administered medications prior to visit.  ? ? ?No Known Allergies ? ?ROS ?Review of Systems  ?Constitutional:  Negative for chills  and fever.  ?HENT:  Negative for congestion and sore throat.   ?Eyes:  Negative for pain and discharge.  ?Respiratory:  Negative for cough and shortness of breath.   ?Cardiovascular:  Negative for chest pain and palpitations.  ?Gastrointestinal:  Negative for constipation, diarrhea, nausea and vomiting.  ?Endocrine: Negative for polydipsia and polyuria.  ?Genitourinary:  Negative for dysuria and hematuria.  ?Musculoskeletal:  Positive for arthralgias (B/l wrist and hand joints with swelling). Negative for neck pain and neck stiffness.  ?Skin:  Negative for rash.  ?Neurological:  Negative for dizziness, weakness, numbness and headaches.  ?Psychiatric/Behavioral:  Negative for agitation and behavioral problems.   ? ?  ?Objective:  ?  ?Physical Exam ?Vitals reviewed.  ?Constitutional:   ?    General: He is not in acute distress. ?   Appearance: He is not diaphoretic.  ?HENT:  ?   Head: Normocephalic and atraumatic.  ?   Nose: Nose normal.  ?   Mouth/Throat:  ?   Mouth: Mucous membranes are moist.  ?Eyes:  ?   General: No scleral icterus. ?   Extraocular Movements: Extraocular movements intact.  ?Cardiovascular:  ?   Rate and Rhythm: Normal rate and regular rhythm.  ?   Heart sounds: No murmur heard. ?Pulmonary:  ?   Breath sounds: Normal breath sounds. No wheezing or rales.  ?Abdominal:  ?   Palpations: Abdomen is soft.  ?   Tenderness: There is no abdominal tenderness.  ?Musculoskeletal:  ?   Right hand: Swelling (PIP and DIP joints) present.  ?   Left hand: Swelling (PIP and DIP joints) present.  ?   Cervical back: Neck supple. No tenderness.  ?   Right lower leg: No edema.  ?   Left lower leg: No edema.  ?Skin: ?   General: Skin is warm.  ?   Findings: No rash.  ?Neurological:  ?   General: No focal deficit present.  ?   Mental Status: He is alert and oriented to person, place, and time.  ?   Cranial Nerves: No cranial nerve deficit.  ?   Sensory: No sensory deficit.  ?   Motor: No weakness.  ?Psychiatric:     ?   Mood and Affect: Mood normal.     ?   Behavior: Behavior normal.  ? ? ?BP 124/82 (BP Location: Left Arm, Patient Position: Sitting)   Pulse 67   Ht 5' 7.5" (1.715 m)   Wt 234 lb 6.4 oz (106.3 kg)   SpO2 98%   BMI 36.17 kg/m?  ?Wt Readings from Last 3 Encounters:  ?04/13/21 234 lb 6.4 oz (106.3 kg)  ?03/22/21 229 lb 6.4 oz (104.1 kg)  ?11/08/20 215 lb 12.8 oz (97.9 kg)  ? ? ?Lab Results  ?Component Value Date  ? TSH 3.364 02/10/2020  ? ?Lab Results  ?Component Value Date  ? WBC 7.2 04/07/2021  ? HGB 14.7 04/07/2021  ? HCT 42.7 04/07/2021  ? MCV 91 04/07/2021  ? PLT 207 04/07/2021  ? ?Lab Results  ?Component Value Date  ? NA 138 04/07/2021  ? K 4.2 04/07/2021  ? CO2 26 04/07/2021  ? GLUCOSE 99 04/07/2021  ? BUN 14 04/07/2021  ? CREATININE 1.31 (H) 04/07/2021  ? BILITOT 0.5 04/07/2021  ?  ALKPHOS 69 04/07/2021  ? AST 24 04/07/2021  ? ALT 23 04/07/2021  ? PROT 7.4 04/07/2021  ? ALBUMIN 4.7 04/07/2021  ? CALCIUM 9.6 04/07/2021  ? ANIONGAP 6 03/22/2021  ? EGFR  65 04/07/2021  ? ?Lab Results  ?Component Value Date  ? CHOL 182 04/07/2021  ? ?Lab Results  ?Component Value Date  ? HDL 47 04/07/2021  ? ?Lab Results  ?Component Value Date  ? LDLCALC 100 (H) 04/07/2021  ? ?Lab Results  ?Component Value Date  ? TRIG 203 (H) 04/07/2021  ? ?Lab Results  ?Component Value Date  ? CHOLHDL 3.5 03/22/2021  ? ?Lab Results  ?Component Value Date  ? HGBA1C 5.5 04/07/2021  ? ? ?  ?Assessment & Plan:  ? ?Problem List Items Addressed This Visit   ? ?  ? Cardiovascular and Mediastinum  ? Chronic systolic heart failure ([place])  ?  Followed by CHF clinic ?On Entresto, Farxiga, Coreg, spironolactone and as needed Lasix ?Appears to be euvolemic currently ?Last echo showed LVEF 30-35% ?  ?  ?  ? Musculoskeletal and Integument  ? Primary osteoarthritis of both hands  ?  Likely due to overuse injury ?Advised to use topical Voltaren gel ?Advised to avoid oral NSAIDs ?  ?  ?  ? Other  ? Encounter for general adult medical examination with abnormal findings - Primary  ?  Physical exam as documented. ?Fasting blood tests reviewed. ?Shingrix first dose and Tdap in the office today. ? ?  ?  ? ?Other Visit Diagnoses   ? ? Screening for colon cancer      ? Relevant Orders  ? Ambulatory referral to Gastroenterology  ? Immunization due      ? Relevant Orders  ? Tdap vaccine greater than or equal to 7yo IM (Completed)  ? Varicella-zoster vaccine IM (Completed)  ? ?  ? ? ?No orders of the defined types were placed in this encounter. ? ? ?Follow-up: Return in about 6 months (around 10/14/2021) for HFrEF and Shingrix #2.  ? ? ?Lindell Spar, MD ?

## 2021-04-13 NOTE — Patient Instructions (Signed)
Please continue taking medications as prescribed. ? ?Please use Voltaren gel for hand pain. Avoid oral NSAIDs such as Ibuprofen or Naproxen for now. Okay to take Tylenol for pain. ? ?Please continue to follow low salt diet and ambulate as tolerated. ?

## 2021-04-13 NOTE — Assessment & Plan Note (Signed)
Likely due to overuse injury Advised to use topical Voltaren gel Advised to avoid oral NSAIDs 

## 2021-04-13 NOTE — Assessment & Plan Note (Signed)
Physical exam as documented. ?Fasting blood tests reviewed. ?Shingrix first dose and Tdap in the office today. ? ?

## 2021-04-13 NOTE — Assessment & Plan Note (Signed)
Followed by CHF clinic On Entresto, Farxiga, Coreg, spironolactone and as needed Lasix Appears to be euvolemic currently Last echo showed LVEF 30-35% 

## 2021-04-21 ENCOUNTER — Encounter: Payer: Self-pay | Admitting: Internal Medicine

## 2021-04-26 ENCOUNTER — Other Ambulatory Visit (HOSPITAL_COMMUNITY): Payer: Self-pay | Admitting: Adult Health

## 2021-05-03 ENCOUNTER — Encounter (HOSPITAL_COMMUNITY): Payer: 59

## 2021-05-13 ENCOUNTER — Other Ambulatory Visit (HOSPITAL_COMMUNITY): Payer: Self-pay | Admitting: *Deleted

## 2021-05-13 ENCOUNTER — Ambulatory Visit (HOSPITAL_COMMUNITY): Payer: 59 | Attending: Internal Medicine

## 2021-05-13 DIAGNOSIS — I3139 Other pericardial effusion (noninflammatory): Secondary | ICD-10-CM | POA: Insufficient documentation

## 2021-05-13 DIAGNOSIS — I5022 Chronic systolic (congestive) heart failure: Secondary | ICD-10-CM

## 2021-05-13 DIAGNOSIS — I428 Other cardiomyopathies: Secondary | ICD-10-CM | POA: Diagnosis not present

## 2021-05-13 DIAGNOSIS — I110 Hypertensive heart disease with heart failure: Secondary | ICD-10-CM | POA: Diagnosis not present

## 2021-05-13 DIAGNOSIS — J45909 Unspecified asthma, uncomplicated: Secondary | ICD-10-CM | POA: Insufficient documentation

## 2021-05-13 DIAGNOSIS — I509 Heart failure, unspecified: Secondary | ICD-10-CM | POA: Diagnosis not present

## 2021-05-13 MED ORDER — SACUBITRIL-VALSARTAN 97-103 MG PO TABS: 1.0000 | ORAL_TABLET | Freq: Two times a day (BID) | ORAL | 3 refills | Status: DC

## 2021-06-10 ENCOUNTER — Other Ambulatory Visit: Payer: Self-pay

## 2021-06-10 MED ORDER — SPIRONOLACTONE 25 MG PO TABS: 25.0000 mg | ORAL_TABLET | Freq: Every day | ORAL | 3 refills | Status: DC

## 2021-06-10 NOTE — Telephone Encounter (Signed)
This is a CHF pt, Dr. Graciela Husbands does not see the pt but as needed. Please address

## 2021-06-21 ENCOUNTER — Encounter: Payer: Self-pay | Admitting: *Deleted

## 2021-06-23 ENCOUNTER — Ambulatory Visit: Payer: 59

## 2021-06-30 ENCOUNTER — Encounter: Payer: Self-pay | Admitting: *Deleted

## 2021-06-30 NOTE — Patient Instructions (Addendum)
Referring MD/PCP: Dr. Allena Katz  Procedure: Colonoscopy  Has patient had this procedure before?  no  If so, when, by whom and where?    Is there a family history of colon cancer?  no  Who?  What age when diagnosed?    Is patient diabetic? If yes, Type 1 or Type 2   no      Does patient have prosthetic heart valve or mechanical valve?  no  Do you have a pacemaker/defibrillator?  no  Has patient ever had endocarditis/atrial fibrillation? no  Does patient use oxygen? no  Has patient had joint replacement within last 12 months?  no  Is patient constipated or do they take laxatives? no  Does patient have a history of alcohol/drug use?  no  Have you had a stroke/heart attack last 6 mths? no  Do you take medicine for weight loss?  no  Is patient on blood thinner such as Coumadin, Plavix and/or Aspirin? no  Medications:   Current Outpatient Medications on File Prior to Visit  Medication Sig Dispense Refill   carvedilol (COREG) 6.25 MG tablet Take 1 tablet (6.25 mg total) by mouth 2 (two) times daily. 180 tablet 3   FARXIGA 10 MG TABS tablet TAKE 1 TABLET BY MOUTH EVERY DAY BEFORE BREAKFAST 30 tablet 4   rosuvastatin (CRESTOR) 40 MG tablet Take 1 tablet (40 mg total) by mouth daily. 90 tablet 3   spironolactone (ALDACTONE) 25 MG tablet Take 1 tablet (25 mg total) by mouth daily. 90 tablet 3   furosemide (LASIX) 40 MG tablet TAKE 1 TABLET (40 MG TOTAL) BY MOUTH DAILY AS NEEDED. FOR WEIGHT 193 LBS OR GREATER (Patient not taking: Reported on 06/30/2021) 90 tablet 1   sacubitril-valsartan (ENTRESTO) 97-103 MG Take 1 tablet by mouth 2 (two) times daily. (Patient not taking: Reported on 06/30/2021) 180 tablet 3   No current facility-administered medications on file prior to visit.      Allergies: No Known Allergies

## 2021-07-04 ENCOUNTER — Encounter (HOSPITAL_COMMUNITY): Payer: Self-pay | Admitting: Internal Medicine

## 2021-07-04 ENCOUNTER — Ambulatory Visit (HOSPITAL_COMMUNITY)
Admission: RE | Admit: 2021-07-04 | Discharge: 2021-07-04 | Disposition: A | Discharge status: Home / Self Care | Payer: 59 | Source: Ambulatory Visit | Attending: Internal Medicine | Admitting: Internal Medicine

## 2021-07-04 VITALS — BP 140/80 | HR 72 | Wt 224.4 lb

## 2021-07-04 DIAGNOSIS — I110 Hypertensive heart disease with heart failure: Secondary | ICD-10-CM | POA: Insufficient documentation

## 2021-07-04 DIAGNOSIS — F1010 Alcohol abuse, uncomplicated: Secondary | ICD-10-CM | POA: Insufficient documentation

## 2021-07-04 DIAGNOSIS — I10 Essential (primary) hypertension: Secondary | ICD-10-CM | POA: Diagnosis not present

## 2021-07-04 DIAGNOSIS — I428 Other cardiomyopathies: Secondary | ICD-10-CM | POA: Diagnosis not present

## 2021-07-04 DIAGNOSIS — Z7902 Long term (current) use of antithrombotics/antiplatelets: Secondary | ICD-10-CM | POA: Insufficient documentation

## 2021-07-04 DIAGNOSIS — Z79899 Other long term (current) drug therapy: Secondary | ICD-10-CM | POA: Diagnosis not present

## 2021-07-04 DIAGNOSIS — Z7984 Long term (current) use of oral hypoglycemic drugs: Secondary | ICD-10-CM | POA: Insufficient documentation

## 2021-07-04 DIAGNOSIS — I3139 Other pericardial effusion (noninflammatory): Secondary | ICD-10-CM | POA: Insufficient documentation

## 2021-07-04 DIAGNOSIS — I5022 Chronic systolic (congestive) heart failure: Secondary | ICD-10-CM | POA: Insufficient documentation

## 2021-07-04 DIAGNOSIS — R0600 Dyspnea, unspecified: Secondary | ICD-10-CM | POA: Insufficient documentation

## 2021-07-04 DIAGNOSIS — I5082 Biventricular heart failure: Secondary | ICD-10-CM | POA: Insufficient documentation

## 2021-07-04 LAB — BASIC METABOLIC PANEL WITH GFR
Anion gap: 10 (ref 5–15)
BUN: 9 mg/dL (ref 6–20)
CO2: 23 mmol/L (ref 22–32)
Calcium: 9.4 mg/dL (ref 8.9–10.3)
Chloride: 103 mmol/L (ref 98–111)
Creatinine, Ser: 1.04 mg/dL (ref 0.61–1.24)
GFR, Estimated: >60 mL/min
Glucose, Bld: 112 mg/dL — ABNORMAL HIGH (ref 70–99)
Potassium: 3.7 mmol/L (ref 3.5–5.1)
Sodium: 136 mmol/L (ref 135–145)

## 2021-07-04 LAB — BRAIN NATRIURETIC PEPTIDE: B Natriuretic Peptide: 158.1 pg/mL — ABNORMAL HIGH (ref 0.0–100.0)

## 2021-07-04 MED ORDER — ENTRESTO 97-103 MG PO TABS: 1.0000 | ORAL_TABLET | Freq: Two times a day (BID) | ORAL | 11 refills | Status: DC

## 2021-07-04 NOTE — Progress Notes (Signed)
Advanced [facility] Failure Clinic Note    PCP: Bjorn Pippin, NP Primary Cardilogist: Dr. Diona Browner HF Cardiologist: Dr. Gala Romney  HPI: Craig Hanson is a 51 y.o. male with a hx of EtOH abuse, chronic systolic HF, and pericardial effusion.    Admitted 02/06/20 with acute heart failure. R/LHC which showed minimal nonobstructive CAD, severe NICM EF <20% and markedly elevated filling pressures w/ preserved CO.1/29, co-ox dropped to 43% and milrinone added. Diuresed ~60 lb. cMRI showed severe biventricular dysfunction c/w NICM. Discharge weight 183 lbs.  Here for f/u. Doing well. Mild DOE. No edema, orthopnea or PND. Compliant with meds but has been out of Entresto for 1-2 months  Echo 3/23: EF 30-35% small effusion    Cardiac Studies: cMRI (2/22): severe biventricular dysfunction and NICM. Moderate to large effusion. No tamponade.  R/LHC 02/13/20: Ost LAD to Prox LAD lesion is 20% stenosed, Ost Cx to Prox Cx lesion is 20% stenosed. Findings:  Ao = 110/84 (95) LV = 106/33 RA =  20 RV = 59/29 PA = 62/20 (43) PCW = 38 Fick cardiac output/index = 5.1/2.3 SVR = 1186 PVR = 1.0 WU FA sat = 96% PA sat = 64%, 68% SVC sat = 66% PAPi = 2.1   Assessment: 1. Minimal nonobstructive CAD 2. 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 Markedly elevated filling pressures with preserved cardiac output   Echo (02/07/20): EF 20-25%, global hypokinesis, grade III dd, severe RV dysfunction, mod LAE, moderate pericardial effusion  ROS: All systems negative except as listed in HPI, PMH and Problem List.  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  Tobacco Use   Smoking status: Never   Smokeless tobacco: Never  Vaping Use   Vaping Use: Never used  Substance and Sexual Activity   Alcohol  use: Yes    Comment: 1 gallon of vodka lasts 2 weeks   Drug use: Never   Sexual activity: Not on file  Other Topics Concern   Not on file  Social History Narrative   Not on file   Social Determinants of Health   Financial Resource Strain: Not on file  Food Insecurity: Not on file  Transportation Needs: Not on file  Physical Activity: Not on file  Stress: Not on file  Social Connections: Not on file  Intimate Partner Violence: Not on file   FH:  Family History  Problem Relation Age of Onset   Hypertension Mother    Heart disease Father     Past Medical History:  Diagnosis Date   Acute CHF (congestive heart failure) ([facility]) 02/06/2020   Asthma    Pericardial effusion    Systolic HF (heart failure) ([facility])     Current Outpatient Medications  Medication Sig Dispense Refill   carvedilol (COREG) 6.25 MG tablet Take 1 tablet (6.25 mg total) by mouth 2 (two) times daily. 180 tablet 3   FARXIGA 10 MG TABS tablet TAKE 1 TABLET BY MOUTH EVERY DAY BEFORE BREAKFAST 30 tablet 4   furosemide (LASIX) 40 MG tablet TAKE 1 TABLET (40 MG TOTAL) BY MOUTH DAILY AS NEEDED. FOR WEIGHT 193 LBS OR GREATER 90 tablet 1   rosuvastatin (CRESTOR) 40 MG tablet Take 1 tablet (40 mg total) by mouth daily.  90 tablet 3   spironolactone (ALDACTONE) 25 MG tablet Take 1 tablet (25 mg total) by mouth daily. 90 tablet 3   No current facility-administered medications for this encounter.    Vitals:   07/04/21 1047  BP: 140/80  Pulse: 72  SpO2: 98%  Weight: 101.8 kg (224 lb 6.4 oz)   Wt Readings from Last 3 Encounters:  07/04/21 101.8 kg (224 lb 6.4 oz)  04/13/21 106.3 kg (234 lb 6.4 oz)  03/22/21 104.1 kg (229 lb 6.4 oz)    PHYSICAL EXAM: General:  Well appearing. No resp difficulty HEENT: normal Neck: supple. no JVD. Carotids 2+ bilat; no bruits. No lymphadenopathy or thryomegaly appreciated. Cor: PMI nondisplaced. Regular rate & rhythm. No rubs, gallops or murmurs. Lungs: clear Abdomen: obese soft,  nontender, nondistended. No hepatosplenomegaly. No bruits or masses. Good bowel sounds. Extremities: no cyanosis, clubbing, rash, edema Neuro: alert & orientedx3, cranial nerves grossly intact. moves all 4 extremities w/o difficulty. Affect pleasant   ASSESSMENT & PLAN:  1. Chronic systolic HF with severe biventricular dysfunction due to NICM - 01/2020 Echo EF 20% severe RV dysfunction moderate pericardial effusion, suspect ETOH related stopped drinking 3 months ago.  - Cath 02/13/20 with minimal CAD and massively volume overload. - cMRI 2/22 EF 17% RV Severe dysfunction, ? Acute myocarditis possible sarcoid. [place] thinning.   - Echo 3/23: EF 30-35% small effusion  - NYHA II. Volume status ok. Takes lasix prn - ECHO 07/09/20 with EF 25-30% - Continue Farxiga 10 mg daily. - Restart Entresto 97/103 bid  -Continue spironolactone 25 mg daily - Continue  coreg to 6.25mg   - Will repeat cMRI to reassess myocarditis vs sarcoid - Check labs  2. Moderate-Large pericardial effusion - Small on echo 3/23   3. ETOH abuse -Rarely drinking alchohol.   4. HTN  - Blood pressure a bit high. Restart Entresto.    Arvilla Meres MD  07/04/21

## 2021-07-04 NOTE — Patient Instructions (Signed)
Restart Entresto 97/103 Twice daily  Labs done today, your results will be available in MyChart, we will contact you for abnormal readings.  Your physician has requested that you have a cardiac MRI. Cardiac MRI uses a computer to create images of your heart as its beating, producing both still and moving pictures of your heart and major blood vessels. For further information please visit [email or web address]. Please follow the instruction sheet given to you today for more information. ONCE APPROVED BY INSURANCE YOU WILL BE CALLED TO SCHEDULE THE TEST.   Your physician recommends that you schedule a follow-up appointment in: 6 months ( December 2023)  **please call the office in September to arrange your follow up appointment.  If you have any questions or concerns before your next appointment please send [place] a message through [place] or call our office at [phone].    TO LEAVE A MESSAGE FOR THE NURSE SELECT OPTION 2, PLEASE LEAVE A MESSAGE INCLUDING: YOUR NAME DATE OF BIRTH CALL BACK NUMBER REASON FOR CALL**this is important as we prioritize the call backs  YOU WILL RECEIVE A CALL BACK THE SAME DAY AS LONG AS YOU CALL BEFORE 4:00 PM  At the [facility], you and your health needs are our priority. As part of our continuing mission to provide you with exceptional heart care, we have created designated Provider Care Teams. These Care Teams include your primary Cardiologist (physician) and Advanced Practice Providers (APPs- Physician Assistants and Nurse Practitioners) who all work together to provide you with the care you need, when you need it.   You may see any of the following providers on your designated Care Team at your next follow up: Dr Arvilla Meres Dr Carron Curie, NP Robbie Lis, [place] [facility] [place], [place] Karle Plumber, PharmD   Please be sure to bring in all your medications bottles to every appointment.

## 2021-07-04 NOTE — Addendum Note (Signed)
Encounter addended by: Linda Hedges, RN on: 07/04/2021 11:37 AM  Actions taken: Order list changed, Diagnosis association updated, Charge Capture section accepted, Clinical Note Signed

## 2021-07-15 NOTE — Progress Notes (Signed)
Significant cardiac history, seen in the CHF clinic by Dr. Arvilla Meres June 19, cardiac MRI pending.  Patient will need to have cardiac clearance prior to pursuing screening colonoscopy.  Once cardiac clearance obtained, would recommend virtual or in person office visit with [place].

## 2021-07-20 ENCOUNTER — Telehealth: Payer: Self-pay | Admitting: *Deleted

## 2021-07-20 NOTE — Telephone Encounter (Signed)
Tiffany Kocher, PA-C filed at 07/15/2021  2:48 PM  Status: Signed  Significant cardiac history, seen in the CHF clinic by Dr. Arvilla Meres June 19, cardiac MRI pending.   Patient will need to have cardiac clearance prior to pursuing screening colonoscopy.  Once cardiac clearance obtained, would recommend virtual or in person office visit with [place].    ------   Please advise Dr. Gala Romney regarding cardiac clearance for screening colonoscopy. Thanks!

## 2021-07-21 NOTE — Progress Notes (Signed)
Phone note sent and he was cleared. Sent to [facility] to schedule OV

## 2021-07-21 NOTE — Telephone Encounter (Signed)
Misty Stanley, please schedule as stated below. Thanks!

## 2021-08-23 NOTE — Progress Notes (Unsigned)
Primary Care Physician:  Anabel Halon, MD  Primary GI: Dr. Marletta Lor  Patient Location: Home   Provider Location: [facility] office   Reason for Visit: colonoscopy triage   Persons present on the virtual encounter, with roles: [name] - patient and Brooke Bonito, NP   Total time (minutes) spent on medical discussion: 13 minutes  Virtual Visit Encounter Note Visit is conducted virtually and was requested by patient.   I connected with Craig Hanson on 08/24/21 at  9:00 AM EDT by video and verified that I am speaking with the correct person using two identifiers.   I discussed the limitations, risks, security and privacy concerns of performing an evaluation and management service by video and the availability of in person appointments. I also discussed with the patient that there may be a patient responsible charge related to this service. The patient expressed understanding and agreed to proceed.  Chief Complaint  Patient presents with   Colonoscopy    No other issues.      History of Present Illness: Craig Hanson is a 51 y.o. male with history of asthma, and systolic heart failure who presents for video visit to discuss pursuing screening colonoscopy.   Patient recently seen by cardiology (Heart failure clinic) on 07/04/21. Patient had hosptial admission in January 2022 with acute heart failure and right heart cath that showed nonobstructive CAD, severe NICM with EF <20% and markedly elevated filling pressures. He was given diuretics and milrinone. In June he reported doing well with mild dyspnea on exertion. Denied edema, orthopnea. Echo 3/23 with EF 30-35% with small effusion.  Cardiac MRI scheduled for 09/12/21. He was placed back on entresto.   Per Dr. Gala Romney patient has been cleared to undergo colonoscopy as he repots preserved functional capacity.   Today: Alcohol - about 2-3 beers a day, 2-3 shots  a couple days a week.  Tobacco -no  Bowel habits - daily  soft bowel movements, no straining. No diarrhea.   Denies melena or BRBPR, abdominal pain, nausea, vomiting, unintentional weight loss, fatigue. Denies chest pain or shortness of breath. Does have occasional lower extremity edema however he has lasix that he takes if he gains a certain amount of weight.   Does have heartburn at times after he takes his medications, sometimes does have to take tums about once per week.   Reports he got sick 1 month after his COVID shot and developed all the heart issues.   Is not taking farxiga, no issues that he knows of with his blood sugars. Has been having stable blood pressures.      Past Medical History:  Diagnosis Date   Acute CHF (congestive heart failure) (HCC) 02/06/2020   Asthma    Pericardial effusion    Systolic HF (heart failure) (HCC)      Past Surgical History:  Procedure Laterality Date   No prior surgery     RIGHT/LEFT HEART CATH AND CORONARY ANGIOGRAPHY N/A 02/13/2020   Procedure: RIGHT/LEFT HEART CATH AND CORONARY ANGIOGRAPHY;  Surgeon: Dolores Patty, MD;  Location: [facility] INV[facility];  Service: Cardiovascular;  Laterality: N/A;     Current Meds  Medication Sig   carvedilol (COREG) 6.25 MG tablet Take 1 tablet (6.25 mg total) by mouth 2 (two) times daily.   furosemide (LASIX) 40 MG tablet TAKE 1 TABLET (40 MG TOTAL) BY MOUTH DAILY AS NEEDED. FOR WEIGHT 193 LBS OR GREATER   rosuvastatin (CRESTOR) 40 MG tablet Take 1 tablet (40 mg  total) by mouth daily.   sacubitril-valsartan (ENTRESTO) 97-103 MG Take 1 tablet by mouth 2 (two) times daily.   spironolactone (ALDACTONE) 25 MG tablet Take 1 tablet (25 mg total) by mouth daily.     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  Tobacco Use   Smoking status: Never   Smokeless tobacco: Never  Vaping Use   Vaping Use: Never used  Substance and Sexual Activity   Alcohol use: Yes    Alcohol/week: 14.0 - 21.0 standard drinks of alcohol    Types: 14 - 21 Cans of beer per week    Comment: 2-3 shots of liquor a few times a week   Drug use: Never   Sexual activity: Yes  Other Topics Concern   Not on file  Social History Narrative   Not on file   Social Determinants of Health   Financial Resource Strain: Not on file  Food Insecurity: Not on file  Transportation Needs: Not on file  Physical Activity: Not on file  Stress: Not on file  Social Connections: Not on file     Review of Systems: Gen: Denies fever, chills, anorexia. Denies fatigue, weakness, weight loss.  CV: Denies chest pain, palpitations, syncope, peripheral edema, and claudication. Resp: Denies dyspnea at rest, cough, wheezing, coughing up blood, and pleurisy. GI: see HPI Derm: Denies rash, itching, dry skin Psych: Denies depression, anxiety, memory loss, confusion. No homicidal or suicidal ideation.  Heme: Denies bruising, bleeding, and enlarged lymph nodes.  Observations/Objective: No distress. Alert and oriented. Pleasant. Well nourished. Normal mood and affect. Unable to perform complete physical exam due to video encounter.   Assessment:  Screening for colon cancer: Patient has never had colonoscopy before.  He denies any family history of colon cancer or colon polyps.  He has not expressed any upper or lower GI concerns other than occasional heartburn that is relieved with Tums once per week.  He has been stable from a cardiac/pulmonary standpoint per the patient.  He has had stable blood pressures, and denies any chest pain, shortness of breath.  He does have occasional lower extremity edema  for which he takes Lasix as needed.  No alarm symptoms present.  Dr. Gala Romney has already given cardiac clearance for the patient.  We will proceed with colonoscopy.  Plan: Proceed with colonoscopy by Dr. Marletta Lor  in near future: the risks, benefits, and alternatives have been discussed with the patient in detail. The patient states understanding and desires to proceed.  Written consent to be obtained.  ASA 3   Follow Up Instructions:  I discussed the assessment and treatment plan with the patient. The patient was provided an opportunity to ask questions and all were answered. The patient agreed with the plan and demonstrated an understanding of the instructions.   The patient was advised to call back or seek an in-person evaluation if the symptoms worsen or if the condition fails to improve as anticipated.    Brooke Bonito, MSN, FNP-BC,  AGACNP-BC [facility]

## 2021-08-24 ENCOUNTER — Encounter: Payer: Self-pay | Admitting: *Deleted

## 2021-08-24 ENCOUNTER — Encounter: Payer: Self-pay | Admitting: Gastroenterology

## 2021-08-24 ENCOUNTER — Telehealth (INDEPENDENT_AMBULATORY_CARE_PROVIDER_SITE_OTHER): Payer: 59 | Admitting: Gastroenterology

## 2021-08-24 ENCOUNTER — Telehealth: Payer: Self-pay | Admitting: Gastroenterology

## 2021-08-24 ENCOUNTER — Telehealth: Payer: Self-pay

## 2021-08-24 VITALS — Ht 67.5 in | Wt 225.0 lb

## 2021-08-24 DIAGNOSIS — Z1211 Encounter for screening for malignant neoplasm of colon: Secondary | ICD-10-CM | POA: Diagnosis not present

## 2021-08-24 MED ORDER — PEG 3350-KCL-NA BICARB-NACL 420 G PO SOLR: 4000.0000 mL | Freq: Once | ORAL | 0 refills | Status: AC

## 2021-08-24 NOTE — Telephone Encounter (Signed)
Colonoscopy with propofol with Dr. Marletta Lor.  ASA 3  Patient has already received cardiac clearance from Dr. Gala Romney.  Dx: Screening for colon cancer  Brooke Bonito, MSN, FNP-BC, AGACNP-BC [facility]

## 2021-08-24 NOTE — Patient Instructions (Signed)
Reschedule your colonoscopy with Dr. Marletta Lor in the near future!  Our scheduler will be contacting you to schedule this procedure.  She will provide you all instructions about your prep and your preop appointment.  It was a pleasure to meet you today!  You may follow-up with [place] as needed, further recommendations about timing of next colonoscopy will be determined after your procedure is performed.  I hope you enjoy the rest of your summer in the upcoming holidays!   I want to create trusting relationships with patients. If you receive a survey regarding your visit,  I greatly appreciate you taking time to fill this out on paper or through your MyChart. I value your feedback.  Brooke Bonito, MSN, FNP-BC, AGACNP-BC [facility]

## 2021-08-24 NOTE — Telephone Encounter (Signed)
Pt already scheduled

## 2021-08-24 NOTE — Telephone Encounter (Signed)
Craig Hanson, you are scheduled for a virtual visit with your provider today.  Just as we do with appointments in the office, we must obtain your consent to participate.  Your consent will be active for this visit and any virtual visit you may have with one of our providers in the next 365 days.  If you have a MyChart account, I can also send a copy of this consent to you electronically.  All virtual visits are billed to your insurance company just like a traditional visit in the office.  As this is a virtual visit, video technology does not allow for your provider to perform a traditional examination.  This may limit your provider's ability to fully assess your condition.  If your provider identifies any concerns that need to be evaluated in person or the need to arrange testing such as labs, EKG, etc, we will make arrangements to do so.  Although advances in technology are sophisticated, we cannot ensure that it will always work on either your end or our end.  If the connection with a video visit is poor, we may have to switch to a telephone visit.  With either a video or telephone visit, we are not always able to ensure that we have a secure connection.   I need to obtain your verbal consent now.   Are you willing to proceed with your visit today? Yes.

## 2021-09-01 ENCOUNTER — Telehealth (HOSPITAL_COMMUNITY): Payer: Self-pay | Admitting: *Deleted

## 2021-09-01 NOTE — Telephone Encounter (Signed)
[employer] Auth request faxed

## 2021-09-01 NOTE — Telephone Encounter (Signed)
[employer] auth approved auth # [id number]  Exp 12/02/21  Members plan terminates 09/15/21 regardless of approval timeframe

## 2021-09-09 ENCOUNTER — Telehealth (HOSPITAL_COMMUNITY): Payer: Self-pay | Admitting: Emergency Medicine

## 2021-09-09 NOTE — Telephone Encounter (Signed)
Reaching out to patient to offer assistance regarding upcoming cardiac imaging study; pt verbalizes understanding of appt date/time, parking situation and where to check in, pre-test NPO status and medications ordered, and verified current allergies; name and call back number provided for further questions should they arise Rockwell Alexandria RN Navigator Cardiac Imaging [facility] [phone] office [phone] cell   Arrive 330 w/c entrance Denies iv issues Denies claustro Denies metal implants

## 2021-09-12 ENCOUNTER — Other Ambulatory Visit (HOSPITAL_COMMUNITY): Payer: Self-pay | Admitting: Internal Medicine

## 2021-09-12 ENCOUNTER — Ambulatory Visit (HOSPITAL_COMMUNITY)
Admission: RE | Admit: 2021-09-12 | Discharge: 2021-09-12 | Disposition: A | Discharge status: Home / Self Care | Payer: 59 | Source: Ambulatory Visit | Attending: Internal Medicine | Admitting: Internal Medicine

## 2021-09-12 DIAGNOSIS — I5022 Chronic systolic (congestive) heart failure: Secondary | ICD-10-CM | POA: Diagnosis not present

## 2021-09-12 MED ORDER — GADOBUTROL 1 MMOL/ML IV SOLN
10.0000 mL | Freq: Once | INTRAVENOUS | Status: AC | PRN
  Administered 2021-09-12: 10 mL via INTRAVENOUS

## 2021-09-16 ENCOUNTER — Other Ambulatory Visit (HOSPITAL_COMMUNITY): Payer: Self-pay | Admitting: Internal Medicine

## 2021-09-20 ENCOUNTER — Telehealth: Payer: Self-pay | Admitting: *Deleted

## 2021-09-20 NOTE — Telephone Encounter (Signed)
Patient had a colonoscopy ASA 3 w/ Dr. Marletta Lor scheduled for 09/26/21 at 9 am. He called to reschedule the procedure until next month. Advised pt that providers schedule isn't open for next month at this time.Once get his schedule, we will call him to schedule his colonoscopy. Verbalized understanding.

## 2021-09-21 ENCOUNTER — Encounter: Payer: Self-pay | Admitting: *Deleted

## 2021-09-21 ENCOUNTER — Encounter (HOSPITAL_COMMUNITY): Payer: Self-pay

## 2021-09-21 NOTE — Telephone Encounter (Signed)
Called pt. Rescheduled to 10/16 at 8:30am. He states he already has his prep at home. Will mail new instructions/pre-op appt.

## 2021-09-26 ENCOUNTER — Ambulatory Visit (HOSPITAL_COMMUNITY): Admission: RE | Admit: 2021-09-26 | Payer: 59 | Source: Home / Self Care

## 2021-09-26 ENCOUNTER — Encounter (HOSPITAL_COMMUNITY): Admission: RE | Payer: Self-pay | Source: Home / Self Care

## 2021-09-26 SURGERY — COLONOSCOPY WITH PROPOFOL
Anesthesia: Monitor Anesthesia Care

## 2021-10-14 ENCOUNTER — Ambulatory Visit: Payer: 59 | Admitting: Internal Medicine

## 2021-10-25 NOTE — Patient Instructions (Addendum)
Craig Hanson  10/25/2021     @PREFPERIOPPHARMACY @   Your procedure is scheduled on  10/31/2021.   Report to [facility] at  0700  A.M.   Call this number if you have problems the morning of surgery:  [phone]   Remember:  Follow the diet and prep instructions given to you by the office.     Take these medicines the morning of surgery with A SIP OF WATER                                    carvedilol, entresto.     Do not wear jewelry, make-up or nail polish.  Do not wear lotions, powders, or perfumes, or deodorant.  Do not shave 48 hours prior to surgery.  Men may shave face and neck.  Do not bring valuables to the hospital.  [facility] is not responsible for any belongings or valuables.  Contacts, dentures or bridgework may not be worn into surgery.  Leave your suitcase in the car.  After surgery it may be brought to your room.  For patients admitted to the hospital, discharge time will be determined by your treatment team.  Patients discharged the day of surgery will not be allowed to drive home and must have someone with them for 24 hours.    Special instructions:   DO NOT smoke tobacco or vape for 24 hours before your procedure.  Please read over the following fact sheets that you were given. Anesthesia Post-op Instructions and Care and Recovery After Surgery           Colonoscopy, Adult, Care After The following information offers guidance on how to care for yourself after your procedure. Your health care provider may also give you more specific instructions. If you have problems or questions, contact your health care provider. What can I expect after the procedure? After the procedure, it is common to have: A small amount of blood in your stool for 24 hours after the procedure. Some gas. Mild cramping or bloating of your abdomen. Follow these instructions at home: Eating and drinking  Drink enough fluid to keep your urine pale yellow. Follow  instructions from your health care provider about eating or drinking restrictions. Resume your normal diet as told by your health care provider. Avoid heavy or fried foods that are hard to digest. Activity Rest as told by your health care provider. Avoid sitting for a long time without moving. Get up to take short walks every 1-2 hours. This is important to improve blood flow and breathing. Ask for help if you feel weak or unsteady. Return to your normal activities as told by your health care provider. Ask your health care provider what activities are safe for you. Managing cramping and bloating  Try walking around when you have cramps or feel bloated. If directed, apply heat to your abdomen as told by your health care provider. Use the heat source that your health care provider recommends, such as a moist heat pack or a heating pad. Place a towel between your skin and the heat source. Leave the heat on for 20-30 minutes. Remove the heat if your skin turns bright red. This is especially important if you are unable to feel pain, heat, or cold. You have a greater risk of getting burned. General instructions If you were given a sedative during the procedure, it can affect you for  several hours. Do not drive or operate machinery until your health care provider says that it is safe. For the first 24 hours after the procedure: Do not sign important documents. Do not drink alcohol. Do your regular daily activities at a slower pace than normal. Eat soft foods that are easy to digest. Take over-the-counter and prescription medicines only as told by your health care provider. Keep all follow-up visits. This is important. Contact a health care provider if: You have blood in your stool 2-3 days after the procedure. Get help right away if: You have more than a small spotting of blood in your stool. You have large blood clots in your stool. You have swelling of your abdomen. You have nausea or  vomiting. You have a fever. You have increasing pain in your abdomen that is not relieved with medicine. These symptoms may be an emergency. Get help right away. Call 911. Do not wait to see if the symptoms will go away. Do not drive yourself to the hospital. Summary After the procedure, it is common to have a small amount of blood in your stool. You may also have mild cramping and bloating of your abdomen. If you were given a sedative during the procedure, it can affect you for several hours. Do not drive or operate machinery until your health care provider says that it is safe. Get help right away if you have a lot of blood in your stool, nausea or vomiting, a fever, or increased pain in your abdomen. This information is not intended to replace advice given to you by your health care provider. Make sure you discuss any questions you have with your health care provider. Document Revised: 08/25/2020 Document Reviewed: 08/25/2020 Elsevier Patient Education  [facility] After This sheet gives you information about how to care for yourself after your procedure. Your health care provider may also give you more specific instructions. If you have problems or questions, contact your health care provider. What can I expect after the procedure? After the procedure, it is common to have: Tiredness. Forgetfulness about what happened after the procedure. Impaired judgment for important decisions. Nausea or vomiting. Some difficulty with balance. Follow these instructions at home: For the time period you were told by your health care provider:     Rest as needed. Do not participate in activities where you could fall or become injured. Do not drive or use machinery. Do not drink alcohol. Do not take sleeping pills or medicines that cause drowsiness. Do not make important decisions or sign legal documents. Do not take care of children on your own. Eating and  drinking Follow the diet that is recommended by your health care provider. Drink enough fluid to keep your urine pale yellow. If you vomit: Drink water, juice, or soup when you can drink without vomiting. Make sure you have little or no nausea before eating solid foods. General instructions Have a responsible adult stay with you for the time you are told. It is important to have someone help care for you until you are awake and alert. Take over-the-counter and prescription medicines only as told by your health care provider. If you have sleep apnea, surgery and certain medicines can increase your risk for breathing problems. Follow instructions from your health care provider about wearing your sleep device: Anytime you are sleeping, including during daytime naps. While taking prescription pain medicines, sleeping medicines, or medicines that make you drowsy. Avoid smoking. Keep all follow-up  visits as told by your health care provider. This is important. Contact a health care provider if: You keep feeling nauseous or you keep vomiting. You feel light-headed. You are still sleepy or having trouble with balance after 24 hours. You develop a rash. You have a fever. You have redness or swelling around the IV site. Get help right away if: You have trouble breathing. You have new-onset confusion at home. Summary For several hours after your procedure, you may feel tired. You may also be forgetful and have poor judgment. Have a responsible adult stay with you for the time you are told. It is important to have someone help care for you until you are awake and alert. Rest as told. Do not drive or operate machinery. Do not drink alcohol or take sleeping pills. Get help right away if you have trouble breathing, or if you suddenly become confused. This information is not intended to replace advice given to you by your health care provider. Make sure you discuss any questions you have with your  health care provider. Document Revised: 12/07/2020 Document Reviewed: 12/05/2018 Elsevier Patient Education  [place].

## 2021-10-27 ENCOUNTER — Other Ambulatory Visit: Payer: Self-pay

## 2021-10-27 ENCOUNTER — Encounter (HOSPITAL_COMMUNITY): Payer: Self-pay

## 2021-10-27 ENCOUNTER — Encounter (HOSPITAL_COMMUNITY)
Admission: RE | Admit: 2021-10-27 | Discharge: 2021-10-27 | Disposition: A | Discharge status: Home / Self Care | Payer: 59 | Source: Ambulatory Visit | Attending: Internal Medicine | Admitting: Internal Medicine

## 2021-10-27 VITALS — BP 154/98 | HR 87 | Temp 97.8°F | Resp 18 | Ht 67.5 in | Wt 225.1 lb

## 2021-10-27 DIAGNOSIS — Z79899 Other long term (current) drug therapy: Secondary | ICD-10-CM | POA: Diagnosis not present

## 2021-10-27 DIAGNOSIS — Z01812 Encounter for preprocedural laboratory examination: Secondary | ICD-10-CM | POA: Diagnosis not present

## 2021-10-27 HISTORY — DX: Gastro-esophageal reflux disease without esophagitis: K21.9

## 2021-10-27 LAB — BASIC METABOLIC PANEL
Anion gap: 7 (ref 5–15)
BUN: 7 mg/dL (ref 6–20)
CO2: 25 mmol/L (ref 22–32)
Calcium: 9 mg/dL (ref 8.9–10.3)
Chloride: 105 mmol/L (ref 98–111)
Creatinine, Ser: 1.09 mg/dL (ref 0.61–1.24)
GFR, Estimated: >60 mL/min (ref >60)
Glucose, Bld: 112 mg/dL — ABNORMAL HIGH (ref 70–99)
Potassium: 3.4 mmol/L — ABNORMAL LOW (ref 3.5–5.1)
Sodium: 137 mmol/L (ref 135–145)

## 2021-10-31 ENCOUNTER — Encounter (HOSPITAL_COMMUNITY)
Admission: RE | Disposition: A | Discharge status: Home / Self Care | Payer: Self-pay | Source: Home / Self Care | Attending: Internal Medicine

## 2021-10-31 ENCOUNTER — Encounter (HOSPITAL_COMMUNITY): Payer: Self-pay

## 2021-10-31 ENCOUNTER — Ambulatory Visit (HOSPITAL_COMMUNITY)
Admission: RE | Admit: 2021-10-31 | Discharge: 2021-10-31 | Disposition: A | Discharge status: Home / Self Care | Payer: 59 | Attending: Internal Medicine | Admitting: Internal Medicine

## 2021-10-31 ENCOUNTER — Ambulatory Visit (HOSPITAL_BASED_OUTPATIENT_CLINIC_OR_DEPARTMENT_OTHER): Payer: 59 | Admitting: Anesthesiology

## 2021-10-31 ENCOUNTER — Other Ambulatory Visit: Payer: Self-pay

## 2021-10-31 ENCOUNTER — Ambulatory Visit (HOSPITAL_COMMUNITY): Payer: 59 | Admitting: Anesthesiology

## 2021-10-31 DIAGNOSIS — K219 Gastro-esophageal reflux disease without esophagitis: Secondary | ICD-10-CM | POA: Diagnosis not present

## 2021-10-31 DIAGNOSIS — J45909 Unspecified asthma, uncomplicated: Secondary | ICD-10-CM

## 2021-10-31 DIAGNOSIS — Z1211 Encounter for screening for malignant neoplasm of colon: Secondary | ICD-10-CM | POA: Insufficient documentation

## 2021-10-31 DIAGNOSIS — I509 Heart failure, unspecified: Secondary | ICD-10-CM | POA: Insufficient documentation

## 2021-10-31 DIAGNOSIS — Z1212 Encounter for screening for malignant neoplasm of rectum: Secondary | ICD-10-CM

## 2021-10-31 HISTORY — PX: COLONOSCOPY WITH PROPOFOL: SHX5780

## 2021-10-31 SURGERY — COLONOSCOPY WITH PROPOFOL
Anesthesia: General

## 2021-10-31 MED ORDER — PROPOFOL 500 MG/50ML IV EMUL
INTRAVENOUS | Status: DC | PRN
  Administered 2021-10-31: 200 ug/kg/min via INTRAVENOUS

## 2021-10-31 MED ORDER — LACTATED RINGERS IV SOLN: INTRAVENOUS | Status: DC

## 2021-10-31 MED ORDER — PROPOFOL 10 MG/ML IV BOLUS
INTRAVENOUS | Status: DC | PRN
  Administered 2021-10-31: 100 mg via INTRAVENOUS

## 2021-10-31 MED ORDER — LIDOCAINE HCL (CARDIAC) PF 100 MG/5ML IV SOSY
PREFILLED_SYRINGE | INTRAVENOUS | Status: DC | PRN
  Administered 2021-10-31: 50 mg via INTRATRACHEAL

## 2021-10-31 NOTE — Op Note (Signed)
[facility] Patient Name: Craig Hanson Procedure Date: 10/31/2021 8:51 AM MRN: [id number] Date of Birth: 20-Mar-1970 Attending MD: Elon Alas. Abbey Chatters DO CSN: [id number] Age: 51 Admit Type: Outpatient Procedure:                Colonoscopy Indications:              Screening for colorectal malignant neoplasm Providers:                Elon Alas. Abbey Chatters, DO, Lambert Mody, Everardo Pacific Referring MD:              Medicines:                See the Anesthesia note for documentation of the                            administered medications Complications:            No immediate complications. Estimated Blood Loss:     Estimated blood loss: none. Procedure:                Pre-Anesthesia Assessment:                           - The anesthesia plan was to use monitored                            anesthesia care (MAC).                           After obtaining informed consent, the colonoscope                            was passed under direct vision. Throughout the                            procedure, the patient's blood pressure, pulse, and                            oxygen saturations were monitored continuously. The                            [id number]-HQ190L ([id number]) scope was introduced through                            the anus and advanced to the the cecum, identified                            by appendiceal orifice and ileocecal valve. The                            colonoscopy was performed without difficulty. The                            patient tolerated the procedure well. The quality  of the bowel preparation was evaluated using the                            BBPS [facility] Bowel Preparation Scale) with scores                            of: Right Colon = 3, Transverse Colon = 3 and Left                            Colon = 3 (entire mucosa seen well with no residual                            staining, small  fragments of stool or opaque                            liquid). The total BBPS score equals 9. Scope In: 9:01:15 AM Scope Out: 9:10:49 AM Scope Withdrawal Time: 0 hours 8 minutes 15 seconds  Total Procedure Duration: 0 hours 9 minutes 34 seconds  Findings:      The perianal and digital rectal examinations were normal.      The entire examined colon appeared normal. Impression:               - The entire examined colon is normal.                           - No specimens collected. Moderate Sedation:      Per Anesthesia Care Recommendation:           - Patient has a contact number available for                            emergencies. The signs and symptoms of potential                            delayed complications were discussed with the                            patient. Return to normal activities tomorrow.                            Written discharge instructions were provided to the                            patient.                           - Resume previous diet.                           - Continue present medications.                           - Repeat colonoscopy in 10 years for screening  purposes.                           - Return to GI clinic PRN. Procedure Code(s):        --- Professional ---                           O3606, Colorectal cancer screening; colonoscopy on                            individual not meeting criteria for high risk Diagnosis Code(s):        --- Professional ---                           Z12.11, Encounter for screening for malignant                            neoplasm of colon CPT copyright 2019 American Medical Association. All rights reserved. The codes documented in this report are preliminary and upon coder review may  be revised to meet current compliance requirements. Elon Alas. Abbey Chatters, DO [place] Abbey Chatters, DO 10/31/2021 9:15:28 AM This report has been signed electronically. Number of Addenda: 0

## 2021-10-31 NOTE — H&P (Signed)
Primary Care Physician:  Lindell Spar, MD Primary Gastroenterologist:  Dr. Abbey Chatters  Pre-Procedure History & Physical: HPI:  Craig Hanson is a 51 y.o. male is here for first ever colonoscopy for colon cancer screening purposes.  Patient denies any family history of colorectal cancer.  No melena or hematochezia.  No abdominal pain or unintentional weight loss.  No change in bowel habits.  Overall feels well from a GI standpoint.  Past Medical History:  Diagnosis Date   Acute CHF (congestive heart failure) ([place]) 02/06/2020   Asthma    GERD (gastroesophageal reflux disease)    Pericardial effusion    Systolic HF (heart failure) ([place])     Past Surgical History:  Procedure Laterality Date   No prior surgery     RIGHT/LEFT HEART CATH AND CORONARY ANGIOGRAPHY N/A 02/13/2020   Procedure: RIGHT/LEFT HEART CATH AND CORONARY ANGIOGRAPHY;  Surgeon: Jolaine Artist, MD;  Location: [facility];  Service: Cardiovascular;  Laterality: N/A;    Prior to Admission medications   Medication Sig Start Date End Date Taking? Authorizing Provider  carvedilol (COREG) 6.25 MG tablet TAKE 1 TABLET BY MOUTH TWICE A DAY 09/16/21  Yes Bensimhon, Shaune Pascal, MD  rosuvastatin (CRESTOR) 40 MG tablet Take 1 tablet (40 mg total) by mouth daily. 09/30/20  Yes Noreene Larsson, NP  sacubitril-valsartan (ENTRESTO) 97-103 MG Take 1 tablet by mouth 2 (two) times daily. 07/04/21  Yes Bensimhon, Shaune Pascal, MD  spironolactone (ALDACTONE) 25 MG tablet Take 1 tablet (25 mg total) by mouth daily. 06/10/21  Yes Bensimhon, Shaune Pascal, MD  furosemide (LASIX) 40 MG tablet TAKE 1 TABLET (40 MG TOTAL) BY MOUTH DAILY AS NEEDED. 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  Tobacco Use   Smoking status: Never   Smokeless tobacco: Never  Vaping Use   Vaping Use: Never used  Substance and Sexual Activity   Alcohol use: Yes    Alcohol/week: 14.0 - 21.0 standard drinks of alcohol    Types: 14 - 21 Cans of beer per week    Comment: 2-3 shots of liquor a few times a week   Drug use: Never   Sexual activity: Yes  Other Topics Concern   Not on file  Social History Narrative   Not on file   Social Determinants of Health   Financial Resource Strain: Not on file  Food Insecurity: Not on file  Transportation Needs: Not on file  Physical Activity: Not on file  Stress: Not on file  Social Connections: Not on file  Intimate Partner Violence: Not on file    Review of Systems: See HPI, otherwise negative ROS  Physical Exam: Vital signs in last 24 hours: Weight:  [102 kg] 102 kg (10/16 0803)   General:   Alert,  Well-developed, well-nourished, pleasant and cooperative in NAD Head:  Normocephalic and atraumatic. Eyes:  Sclera clear, no icterus.   Conjunctiva pink. Ears:  Normal auditory acuity. Nose:  No deformity, discharge,  or lesions. Mouth:  No deformity or lesions, dentition normal. Neck:  Supple; no masses or thyromegaly. Lungs:  Clear throughout to auscultation.   No wheezes, crackles, or rhonchi. No acute distress. Heart:  Regular rate and rhythm; no murmurs, clicks, rubs,  or gallops. Abdomen:  Soft, nontender and nondistended. No masses, hepatosplenomegaly or hernias noted. Normal bowel sounds, without guarding, and without rebound.   Msk:  Symmetrical without gross deformities. Normal posture. Extremities:  Without clubbing or  edema. Neurologic:  Alert and  oriented x4;  grossly normal neurologically. Skin:  Intact without significant lesions or rashes. Cervical Nodes:  No significant cervical adenopathy. Psych:  Alert and cooperative. Normal mood and affect.  Impression/Plan: Craig Hanson is here for a colonoscopy to be performed for colon cancer screening purposes.  The risks of the procedure including infection, bleed, or perforation as well as benefits, limitations, alternatives and imponderables have been reviewed with the patient. Questions have been answered. All parties agreeable.

## 2021-10-31 NOTE — Transfer of Care (Signed)
Immediate Anesthesia Transfer of Care Note  Patient: Craig Hanson  Procedure(s) Performed: COLONOSCOPY WITH PROPOFOL  Patient Location: Short Stay  Anesthesia Type:General  Level of Consciousness: awake, alert , oriented and patient cooperative  Airway & Oxygen Therapy: Patient Spontanous Breathing and Patient connected to face mask oxygen  Post-op Assessment: Report given to RN, Post -op Vital signs reviewed and stable and Patient moving all extremities  Post vital signs: Reviewed and stable  Last Vitals:  Vitals Value Taken Time  BP 98/66 10/31/21 0916  Temp 36.9 C 10/31/21 0916  Pulse 81 10/31/21 0916  Resp 16 10/31/21 0916  SpO2 98 % 10/31/21 0916    Last Pain:  Vitals:   10/31/21 0916  TempSrc: Oral  PainSc: 0-No pain         Complications: No notable events documented.

## 2021-10-31 NOTE — Anesthesia Preprocedure Evaluation (Addendum)
Anesthesia Evaluation  Patient identified by MRN, date of birth, ID band Patient awake    Reviewed: Allergy & Precautions, H&P , NPO status , Patient's Chart, lab work & pertinent test results, reviewed documented beta blocker date and time   Airway Mallampati: II  TM Distance: >3 FB Neck ROM: full    Dental no notable dental hx.    Pulmonary asthma ,    Pulmonary exam normal breath sounds clear to auscultation       Cardiovascular Exercise Tolerance: Good +CHF   Rhythm:regular Rate:Normal     Neuro/Psych negative neurological ROS  negative psych ROS   GI/Hepatic Neg liver ROS, GERD  Medicated,  Endo/Other  negative endocrine ROS  Renal/GU negative Renal ROS  negative genitourinary   Musculoskeletal   Abdominal   Peds  Hematology negative hematology ROS (+)   Anesthesia Other Findings 1. Left ventricular ejection fraction, by estimation, is 30 to 35%. The  left ventricle has moderately decreased function. The left ventricle  demonstrates regional wall motion abnormalities. Abnormal (paradoxical)  septal motion, consistent with left  bundle branch block   The left ventricular internal cavity size was mildly dilated. Left  ventricular diastolic parameters are indeterminate.  2. Right ventricular systolic function is normal. The right ventricular  size is normal. Tricuspid regurgitation signal is inadequate for assessing  PA pressure.  3. A small pericardial effusion is present.  4. The mitral valve is normal in structure. Trivial mitral valve  regurgitation.  5. The aortic valve was not well visualized. Aortic valve regurgitation  is not visualized.   Reproductive/Obstetrics negative OB ROS                            Anesthesia Physical Anesthesia Plan  ASA: 3  Anesthesia Plan: General   Post-op Pain Management:    Induction:   PONV Risk Score and Plan: Propofol  infusion  Airway Management Planned:   Additional Equipment:   Intra-op Plan:   Post-operative Plan:   Informed Consent: I have reviewed the patients History and Physical, chart, labs and discussed the procedure including the risks, benefits and alternatives for the proposed anesthesia with the patient or authorized representative who has indicated his/her understanding and acceptance.     Dental Advisory Given  Plan Discussed with: CRNA  Anesthesia Plan Comments:        Anesthesia Quick Evaluation

## 2021-10-31 NOTE — Anesthesia Postprocedure Evaluation (Signed)
Anesthesia Post Note  Patient: Craig Hanson  Procedure(s) Performed: COLONOSCOPY WITH PROPOFOL  Patient location during evaluation: Phase II Anesthesia Type: General Level of consciousness: awake Pain management: pain level controlled Vital Signs Assessment: post-procedure vital signs reviewed and stable Respiratory status: spontaneous breathing and respiratory function stable Cardiovascular status: blood pressure returned to baseline and stable Postop Assessment: no headache and no apparent nausea or vomiting Anesthetic complications: no Comments: Late entry   No notable events documented.   Last Vitals:  Vitals:   10/31/21 0916  BP: 98/66  Pulse: 81  Resp: 16  Temp: 36.9 C  SpO2: 98%    Last Pain:  Vitals:   10/31/21 0916  TempSrc: Oral  PainSc: 0-No pain                 Louann Sjogren

## 2021-10-31 NOTE — Discharge Instructions (Addendum)

## 2021-11-02 ENCOUNTER — Ambulatory Visit (INDEPENDENT_AMBULATORY_CARE_PROVIDER_SITE_OTHER): Payer: 59 | Admitting: Internal Medicine

## 2021-11-02 ENCOUNTER — Encounter: Payer: Self-pay | Admitting: Internal Medicine

## 2021-11-02 VITALS — BP 134/84 | HR 70 | Ht 67.0 in | Wt 240.0 lb

## 2021-11-02 DIAGNOSIS — E782 Mixed hyperlipidemia: Secondary | ICD-10-CM

## 2021-11-02 DIAGNOSIS — I5022 Chronic systolic (congestive) heart failure: Secondary | ICD-10-CM | POA: Diagnosis not present

## 2021-11-02 DIAGNOSIS — Z23 Encounter for immunization: Secondary | ICD-10-CM | POA: Diagnosis not present

## 2021-11-02 DIAGNOSIS — I10 Essential (primary) hypertension: Secondary | ICD-10-CM | POA: Insufficient documentation

## 2021-11-02 DIAGNOSIS — Z2821 Immunization not carried out because of patient refusal: Secondary | ICD-10-CM | POA: Diagnosis not present

## 2021-11-02 MED ORDER — FUROSEMIDE 40 MG PO TABS: 40.0000 mg | ORAL_TABLET | Freq: Every day | ORAL | 1 refills | Status: DC | PRN

## 2021-11-02 MED ORDER — ROSUVASTATIN CALCIUM 40 MG PO TABS: 40.0000 mg | ORAL_TABLET | Freq: Every day | ORAL | 3 refills | Status: DC

## 2021-11-02 NOTE — Progress Notes (Signed)
Established Patient Office Visit  Subjective:  Patient ID: Craig Hanson, male    DOB: 03-13-1970  Age: 51 y.o. MRN: [id number]  CC:  Chief Complaint  Patient presents with   Follow-up    F/u     HPI Craig Hanson is a 51 y.o. male with past medical history of HFrEF who presents for f/u of his chronic medical conditions.  He has been doing well overall.  Denies any recent  worsening of leg swelling or dyspnea.  Denies any chest pain, dizziness or palpitations. Followed by CHF clinic.    Past Medical History:  Diagnosis Date   Acute CHF (congestive heart failure) ([place]) 02/06/2020   Asthma    GERD (gastroesophageal reflux disease)    Pericardial effusion    Systolic HF (heart failure) ([place])     Past Surgical History:  Procedure Laterality Date   COLONOSCOPY WITH PROPOFOL N/A 10/31/2021   Procedure: COLONOSCOPY WITH PROPOFOL;  Surgeon: Eloise Harman, DO;  Location: [facility];  Service: Endoscopy;  Laterality: N/A;  8:30am, asa 3   No prior surgery     RIGHT/LEFT HEART CATH AND CORONARY ANGIOGRAPHY N/A 02/13/2020   Procedure: RIGHT/LEFT HEART CATH AND CORONARY ANGIOGRAPHY;  Surgeon: Jolaine Artist, MD;  Location: [facility];  Service: Cardiovascular;  Laterality: N/A;    Family History  Problem Relation Age of Onset   Hypertension Mother    Heart disease Father    Stroke Father    Colon cancer Neg Hx    Colonic polyp Neg Hx     Social History   Socioeconomic History   Marital status: Married    Spouse name: Not on file   Number of children: Not on file   Years of education: Not on file   Highest education level: Not on file  Occupational History    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  Tobacco Use   Smoking status: Never   Smokeless tobacco: Never  Vaping Use   Vaping Use: Never used  Substance and Sexual Activity   Alcohol use: Not Currently    Alcohol/week: 14.0 - 21.0 standard drinks of alcohol    Types: 14 - 21  Cans of beer per week    Comment: 2-3 shots of liquor a few times a week   Drug use: Never   Sexual activity: Yes  Other Topics Concern   Not on file  Social History Narrative   Not on file   Social Determinants of Health   Financial Resource Strain: Not on file  Food Insecurity: Not on file  Transportation Needs: Not on file  Physical Activity: Not on file  Stress: Not on file  Social Connections: Not on file  Intimate Partner Violence: Not on file    Outpatient Medications Prior to Visit  Medication Sig Dispense Refill   carvedilol (COREG) 6.25 MG tablet TAKE 1 TABLET BY MOUTH TWICE A DAY 180 tablet 3   sacubitril-valsartan (ENTRESTO) 97-103 MG Take 1 tablet by mouth 2 (two) times daily. 60 tablet 11   spironolactone (ALDACTONE) 25 MG tablet Take 1 tablet (25 mg total) by mouth daily. 90 tablet 3   rosuvastatin (CRESTOR) 40 MG tablet Take 1 tablet (40 mg total) by mouth daily. 90 tablet 3   furosemide (LASIX) 40 MG tablet TAKE 1 TABLET (40 MG TOTAL) BY MOUTH DAILY AS NEEDED. FOR WEIGHT 193 LBS OR GREATER (Patient not taking: Reported on 10/25/2021) 90 tablet 1   No facility-administered medications  prior to visit.    No Known Allergies  ROS Review of Systems  Constitutional:  Negative for chills and fever.  HENT:  Negative for congestion and sore throat.   Eyes:  Negative for pain and discharge.  Respiratory:  Negative for cough and shortness of breath.   Cardiovascular:  Negative for chest pain and palpitations.  Gastrointestinal:  Negative for constipation, diarrhea, nausea and vomiting.  Endocrine: Negative for polydipsia and polyuria.  Genitourinary:  Negative for dysuria and hematuria.  Musculoskeletal:  Positive for arthralgias (B/l wrist and hand joints with swelling). Negative for neck pain and neck stiffness.  Skin:  Negative for rash.  Neurological:  Negative for dizziness, weakness, numbness and headaches.  Psychiatric/Behavioral:  Negative for agitation and  behavioral problems.       Objective:    Physical Exam Vitals reviewed.  Constitutional:      General: He is not in acute distress.    Appearance: He is not diaphoretic.  HENT:     Head: Normocephalic and atraumatic.     Nose: Nose normal.     Mouth/Throat:     Mouth: Mucous membranes are moist.  Eyes:     General: No scleral icterus.    Extraocular Movements: Extraocular movements intact.  Cardiovascular:     Rate and Rhythm: Normal rate and regular rhythm.     Heart sounds: No murmur heard. Pulmonary:     Breath sounds: Normal breath sounds. No wheezing or rales.  Musculoskeletal:     Right hand: Swelling (PIP and DIP joints) present.     Left hand: Swelling (PIP and DIP joints) present.     Cervical back: Neck supple. No tenderness.     Right lower leg: No edema.     Left lower leg: No edema.  Skin:    General: Skin is warm.     Findings: No rash.  Neurological:     General: No focal deficit present.     Mental Status: He is alert and oriented to person, place, and time.     Cranial Nerves: No cranial nerve deficit.     Sensory: No sensory deficit.     Motor: No weakness.  Psychiatric:        Mood and Affect: Mood normal.        Behavior: Behavior normal.     BP 134/84 (BP Location: Right Arm, Cuff Size: Normal)   Pulse 70   Ht $R'5\' 7"'nh$  (1.702 m)   Wt 240 lb (108.9 kg)   SpO2 99%   BMI 37.59 kg/m  Wt Readings from Last 3 Encounters:  11/02/21 240 lb (108.9 kg)  10/31/21 224 lb 13.9 oz (102 kg)  10/27/21 225 lb 1.4 oz (102.1 kg)    Lab Results  Component Value Date   TSH 3.364 02/10/2020   Lab Results  Component Value Date   WBC 7.2 04/07/2021   HGB 14.7 04/07/2021   HCT 42.7 04/07/2021   MCV 91 04/07/2021   PLT 207 04/07/2021   Lab Results  Component Value Date   NA 139 11/02/2021   K 3.7 11/02/2021   CO2 24 11/02/2021   GLUCOSE 119 (H) 11/02/2021   BUN 7 11/02/2021   CREATININE 1.05 11/02/2021   BILITOT 0.5 04/07/2021   ALKPHOS 69  04/07/2021   AST 24 04/07/2021   ALT 23 04/07/2021   PROT 7.4 04/07/2021   ALBUMIN 4.7 04/07/2021   CALCIUM 9.4 11/02/2021   ANIONGAP 7 10/27/2021   EGFR 85 11/02/2021  Lab Results  Component Value Date   CHOL 163 11/02/2021   Lab Results  Component Value Date   HDL 45 11/02/2021   Lab Results  Component Value Date   LDLCALC 91 11/02/2021   Lab Results  Component Value Date   TRIG 153 (H) 11/02/2021   Lab Results  Component Value Date   CHOLHDL 3.6 11/02/2021   Lab Results  Component Value Date   HGBA1C 5.5 04/07/2021      Assessment & Plan:   Problem List Items Addressed This Visit       Cardiovascular and Mediastinum   Chronic systolic heart failure ([place]) - Primary    Followed by CHF clinic On Entresto, Farxiga, Coreg, spironolactone and as needed Lasix Appears to be euvolemic currently Last echo showed LVEF 30-35%      Relevant Medications   furosemide (LASIX) 40 MG tablet   rosuvastatin (CRESTOR) 40 MG tablet   Other Relevant Orders   Basic Metabolic Panel (BMET) (Completed)   Essential hypertension    BP Readings from Last 1 Encounters:  11/02/21 134/84  Well-controlled Counseled for compliance with the medications Advised DASH diet and moderate exercise/walking, at least 150 mins/week      Relevant Medications   furosemide (LASIX) 40 MG tablet   rosuvastatin (CRESTOR) 40 MG tablet     Other   Morbid obesity ([place])    BMI Readings from Last 1 Encounters:  11/02/21 37.59 kg/m  Associated with HFrEF, OA and HLD Advised to follow low-carb diet and perform moderate exercise at least 150 minutes/week      Refused influenza vaccine   Mixed hyperlipidemia    Needs to take Crestor regularly, refilled      Relevant Medications   furosemide (LASIX) 40 MG tablet   rosuvastatin (CRESTOR) 40 MG tablet   Other Relevant Orders   Lipid Profile (Completed)   Other Visit Diagnoses     Need for varicella vaccine       Relevant Orders    Varicella-zoster vaccine IM (Completed)       Meds ordered this encounter  Medications   furosemide (LASIX) 40 MG tablet    Sig: Take 1 tablet (40 mg total) by mouth daily as needed.    Dispense:  90 tablet    Refill:  1   rosuvastatin (CRESTOR) 40 MG tablet    Sig: Take 1 tablet (40 mg total) by mouth daily.    Dispense:  90 tablet    Refill:  3    Follow-up: Return in about 6 months (around 05/04/2022) for Annual physical.    Lindell Spar, MD

## 2021-11-02 NOTE — Patient Instructions (Signed)
Please continue taking medications as prescribed.  Please continue to follow low salt diet and perform moderate exercise/walking at least 150 mins/week. 

## 2021-11-03 LAB — BASIC METABOLIC PANEL
BUN/Creatinine Ratio: 7 — ABNORMAL LOW (ref 9–20)
BUN: 7 mg/dL (ref 6–24)
CO2: 24 mmol/L (ref 20–29)
Calcium: 9.4 mg/dL (ref 8.7–10.2)
Chloride: 101 mmol/L (ref 96–106)
Creatinine, Ser: 1.05 mg/dL (ref 0.76–1.27)
Glucose: 119 mg/dL — ABNORMAL HIGH (ref 70–99)
Potassium: 3.7 mmol/L (ref 3.5–5.2)
Sodium: 139 mmol/L (ref 134–144)
eGFR: 85 mL/min/{1.73_m2} (ref >59)

## 2021-11-03 LAB — LIPID PANEL
Chol/HDL Ratio: 3.6 ratio (ref 0.0–5.0)
Cholesterol, Total: 163 mg/dL (ref 100–199)
HDL: 45 mg/dL (ref >39)
LDL Chol Calc (NIH): 91 mg/dL (ref 0–99)
Triglycerides: 153 mg/dL — ABNORMAL HIGH (ref 0–149)
VLDL Cholesterol Cal: 27 mg/dL (ref 5–40)

## 2021-11-04 ENCOUNTER — Encounter (HOSPITAL_COMMUNITY): Payer: Self-pay | Admitting: Internal Medicine

## 2021-11-04 DIAGNOSIS — E782 Mixed hyperlipidemia: Secondary | ICD-10-CM | POA: Insufficient documentation

## 2021-11-04 NOTE — Assessment & Plan Note (Signed)
Followed by CHF clinic On Entresto, Farxiga, Coreg, spironolactone and as needed Lasix Appears to be euvolemic currently Last echo showed LVEF 30-35%

## 2021-11-04 NOTE — Assessment & Plan Note (Signed)
BP Readings from Last 1 Encounters:  11/02/21 134/84   Well-controlled Counseled for compliance with the medications Advised DASH diet and moderate exercise/walking, at least 150 mins/week

## 2021-11-04 NOTE — Addendum Note (Signed)
Addendum  created 11/04/21 0929 by Myna Bright, CRNA   Intraprocedure Staff edited

## 2021-11-04 NOTE — Assessment & Plan Note (Signed)
BMI Readings from Last 1 Encounters:  11/02/21 37.59 kg/m   Associated with HFrEF, OA and HLD Advised to follow low-carb diet and perform moderate exercise at least 150 minutes/week

## 2021-11-04 NOTE — Assessment & Plan Note (Signed)
Needs to take Crestor regularly, refilled 

## 2021-11-26 ENCOUNTER — Other Ambulatory Visit: Payer: Self-pay | Admitting: Internal Medicine

## 2022-01-10 ENCOUNTER — Telehealth: Payer: Self-pay | Admitting: Internal Medicine

## 2022-01-10 ENCOUNTER — Telehealth (HOSPITAL_COMMUNITY): Payer: Self-pay

## 2022-01-10 NOTE — Telephone Encounter (Signed)
Disability forms   Noted  Copied  Sleeved   In brown folder   Appt 01/24/22

## 2022-01-10 NOTE — Telephone Encounter (Signed)
Forms faxed on 01/06/22 at 4pm patient aware

## 2022-01-24 ENCOUNTER — Ambulatory Visit (INDEPENDENT_AMBULATORY_CARE_PROVIDER_SITE_OTHER): Payer: 59 | Admitting: Internal Medicine

## 2022-01-24 ENCOUNTER — Encounter: Payer: Self-pay | Admitting: Internal Medicine

## 2022-01-24 VITALS — BP 136/86 | HR 84 | Ht 67.0 in | Wt 234.8 lb

## 2022-01-24 DIAGNOSIS — I10 Essential (primary) hypertension: Secondary | ICD-10-CM | POA: Diagnosis not present

## 2022-01-24 DIAGNOSIS — I5022 Chronic systolic (congestive) heart failure: Secondary | ICD-10-CM

## 2022-01-24 DIAGNOSIS — M19041 Primary osteoarthritis, right hand: Secondary | ICD-10-CM | POA: Diagnosis not present

## 2022-01-24 DIAGNOSIS — Z0279 Encounter for issue of other medical certificate: Secondary | ICD-10-CM

## 2022-01-24 DIAGNOSIS — M19042 Primary osteoarthritis, left hand: Secondary | ICD-10-CM

## 2022-01-24 DIAGNOSIS — E782 Mixed hyperlipidemia: Secondary | ICD-10-CM

## 2022-01-24 NOTE — Patient Instructions (Signed)
Please continue taking medications as prescribed.  Please continue to follow low salt diet and ambulate as tolerated. 

## 2022-01-24 NOTE — Progress Notes (Signed)
Established Patient Office Visit  Subjective:  Patient ID: Craig Hanson, male    DOB: January 18, 1970  Age: 52 y.o. MRN: [id number]  CC:  Chief Complaint  Patient presents with   Disability Paperwork    Patient is needing disability paperwork filled out for his job    HPI Craig Hanson is a 52 y.o. male with past medical history of HFrEF who presents for f/u of his chronic medical conditions.  He has been stable overall.  He has exertional dyspnea and fatigue.  He has excessive sweating and nausea upon minimal exertion.  He has been advised to avoid lifting, carrying, pushing or pulling more than 15 lbs. Denies any recent worsening of leg swelling or dyspnea.  Denies any chest pain or palpitations. Followed by CHF clinic.    Past Medical History:  Diagnosis Date   Acute CHF (congestive heart failure) ([place]) 02/06/2020   Asthma    GERD (gastroesophageal reflux disease)    Pericardial effusion    Systolic HF (heart failure) ([place])     Past Surgical History:  Procedure Laterality Date   COLONOSCOPY WITH PROPOFOL N/A 10/31/2021   Procedure: COLONOSCOPY WITH PROPOFOL;  Surgeon: Eloise Harman, DO;  Location: [facility];  Service: Endoscopy;  Laterality: N/A;  8:30am, asa 3   No prior surgery     RIGHT/LEFT HEART CATH AND CORONARY ANGIOGRAPHY N/A 02/13/2020   Procedure: RIGHT/LEFT HEART CATH AND CORONARY ANGIOGRAPHY;  Surgeon: Jolaine Artist, MD;  Location: [place] [facility];  Service: Cardiovascular;  Laterality: N/A;    Family History  Problem Relation Age of Onset   Hypertension Mother    Heart disease Father    Stroke Father    Colon cancer Neg Hx    Colonic polyp Neg Hx     Social History   Socioeconomic History   Marital status: Married    Spouse name: Not on file   Number of children: Not on file   Years of education: Not on file   Highest education level: Not on file  Occupational History    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   Tobacco Use   Smoking status: Never   Smokeless tobacco: Never  Vaping Use   Vaping Use: Never used  Substance and Sexual Activity   Alcohol use: Not Currently    Alcohol/week: 14.0 - 21.0 standard drinks of alcohol    Types: 14 - 21 Cans of beer per week    Comment: 2-3 shots of liquor a few times a week   Drug use: Never   Sexual activity: Yes  Other Topics Concern   Not on file  Social History Narrative   Not on file   Social Determinants of Health   Financial Resource Strain: Not on file  Food Insecurity: Not on file  Transportation Needs: Not on file  Physical Activity: Not on file  Stress: Not on file  Social Connections: Not on file  Intimate Partner Violence: Not on file    Outpatient Medications Prior to Visit  Medication Sig Dispense Refill   carvedilol (COREG) 6.25 MG tablet TAKE 1 TABLET BY MOUTH TWICE A DAY 180 tablet 3   furosemide (LASIX) 40 MG tablet Take 1 tablet (40 mg total) by mouth daily as needed. 90 tablet 1   rosuvastatin (CRESTOR) 40 MG tablet Take 1 tablet (40 mg total) by mouth daily. 90 tablet 3   sacubitril-valsartan (ENTRESTO) 97-103 MG Take 1 tablet by mouth 2 (two) times daily. Forgan  tablet 11   spironolactone (ALDACTONE) 25 MG tablet Take 1 tablet (25 mg total) by mouth daily. 90 tablet 3   No facility-administered medications prior to visit.    No Known Allergies  ROS Review of Systems  Constitutional:  Negative for chills and fever.  HENT:  Negative for congestion and sore throat.   Eyes:  Negative for pain and discharge.  Respiratory:  Negative for cough and shortness of breath.   Cardiovascular:  Negative for chest pain and palpitations.  Gastrointestinal:  Negative for constipation, diarrhea, nausea and vomiting.  Endocrine: Negative for polydipsia and polyuria.  Genitourinary:  Negative for dysuria and hematuria.  Musculoskeletal:  Positive for arthralgias (B/l wrist and hand joints with swelling). Negative for neck pain and  neck stiffness.  Skin:  Negative for rash.  Neurological:  Negative for dizziness, weakness, numbness and headaches.  Psychiatric/Behavioral:  Negative for agitation and behavioral problems.       Objective:    Physical Exam Vitals reviewed.  Constitutional:      General: He is not in acute distress.    Appearance: He is not diaphoretic.  HENT:     Head: Normocephalic and atraumatic.     Nose: Nose normal.     Mouth/Throat:     Mouth: Mucous membranes are moist.  Eyes:     General: No scleral icterus.    Extraocular Movements: Extraocular movements intact.  Cardiovascular:     Rate and Rhythm: Normal rate and regular rhythm.     Heart sounds: No murmur heard. Pulmonary:     Breath sounds: Normal breath sounds. No wheezing or rales.  Musculoskeletal:     Right hand: Swelling (PIP and DIP joints) present.     Left hand: Swelling (PIP and DIP joints) present.     Cervical back: Neck supple. No tenderness.     Right lower leg: No edema.     Left lower leg: No edema.  Skin:    General: Skin is warm.     Findings: No rash.  Neurological:     General: No focal deficit present.     Mental Status: He is alert and oriented to person, place, and time.     Cranial Nerves: No cranial nerve deficit.     Sensory: No sensory deficit.     Motor: No weakness.  Psychiatric:        Mood and Affect: Mood normal.        Behavior: Behavior normal.     BP 136/86 (BP Location: Left Arm, Cuff Size: Normal)   Pulse 84   Ht 5\' 7"  (1.702 m)   Wt 234 lb 12.8 oz (106.5 kg)   SpO2 97%   BMI 36.77 kg/m  Wt Readings from Last 3 Encounters:  01/24/22 234 lb 12.8 oz (106.5 kg)  11/02/21 240 lb (108.9 kg)  10/31/21 224 lb 13.9 oz (102 kg)    Lab Results  Component Value Date   TSH 3.364 02/10/2020   Lab Results  Component Value Date   WBC 7.2 04/07/2021   HGB 14.7 04/07/2021   HCT 42.7 04/07/2021   MCV 91 04/07/2021   PLT 207 04/07/2021   Lab Results  Component Value Date   NA  139 11/02/2021   K 3.7 11/02/2021   CO2 24 11/02/2021   GLUCOSE 119 (H) 11/02/2021   BUN 7 11/02/2021   CREATININE 1.05 11/02/2021   BILITOT 0.5 04/07/2021   ALKPHOS 69 04/07/2021   AST 24 04/07/2021  ALT 23 04/07/2021   PROT 7.4 04/07/2021   ALBUMIN 4.7 04/07/2021   CALCIUM 9.4 11/02/2021   ANIONGAP 7 10/27/2021   EGFR 85 11/02/2021   Lab Results  Component Value Date   CHOL 163 11/02/2021   Lab Results  Component Value Date   HDL 45 11/02/2021   Lab Results  Component Value Date   LDLCALC 91 11/02/2021   Lab Results  Component Value Date   TRIG 153 (H) 11/02/2021   Lab Results  Component Value Date   CHOLHDL 3.6 11/02/2021   Lab Results  Component Value Date   HGBA1C 5.5 04/07/2021      Assessment & Plan:   Problem List Items Addressed This Visit       Cardiovascular and Mediastinum   Chronic systolic heart failure ([place]) - Primary    Followed by CHF clinic On Entresto, Coreg, spironolactone and as needed Lasix Appears to be euvolemic currently Last echo showed LVEF 30-35%  FMLA paperwork filled out and faxed today      Essential hypertension    BP Readings from Last 1 Encounters:  01/24/22 136/86  Well-controlled Counseled for compliance with the medications Advised DASH diet and moderate exercise/walking as tolerated        Musculoskeletal and Integument   Primary osteoarthritis of both hands    Likely due to overuse injury Advised to use topical Voltaren gel Advised to avoid oral NSAIDs        Other   Mixed hyperlipidemia    Needs to take Crestor regularly, refilled       No orders of the defined types were placed in this encounter.   Follow-up: Return if symptoms worsen or fail to improve.    Lindell Spar, MD

## 2022-01-24 NOTE — Assessment & Plan Note (Signed)
Needs to take Crestor regularly, refilled

## 2022-01-24 NOTE — Assessment & Plan Note (Signed)
Followed by CHF clinic On Entresto, Coreg, spironolactone and as needed Lasix Appears to be euvolemic currently Last echo showed LVEF 30-35%  FMLA paperwork filled out and faxed today

## 2022-01-24 NOTE — Assessment & Plan Note (Addendum)
BP Readings from Last 1 Encounters:  01/24/22 136/86   Well-controlled Counseled for compliance with the medications Advised DASH diet and moderate exercise/walking as tolerated

## 2022-01-24 NOTE — Assessment & Plan Note (Signed)
Likely due to overuse injury Advised to use topical Voltaren gel Advised to avoid oral NSAIDs

## 2022-02-06 NOTE — Telephone Encounter (Signed)
Patient picked up and paid for forms. 

## 2022-02-09 ENCOUNTER — Ambulatory Visit (HOSPITAL_COMMUNITY)
Admission: RE | Admit: 2022-02-09 | Discharge: 2022-02-09 | Disposition: A | Discharge status: Home / Self Care | Payer: 59 | Source: Ambulatory Visit | Attending: Internal Medicine | Admitting: Internal Medicine

## 2022-02-09 ENCOUNTER — Other Ambulatory Visit (HOSPITAL_COMMUNITY): Payer: Self-pay

## 2022-02-09 VITALS — BP 146/90 | HR 85 | Wt 238.2 lb

## 2022-02-09 DIAGNOSIS — I3139 Other pericardial effusion (noninflammatory): Secondary | ICD-10-CM | POA: Diagnosis not present

## 2022-02-09 DIAGNOSIS — I428 Other cardiomyopathies: Secondary | ICD-10-CM | POA: Insufficient documentation

## 2022-02-09 DIAGNOSIS — Z7984 Long term (current) use of oral hypoglycemic drugs: Secondary | ICD-10-CM | POA: Insufficient documentation

## 2022-02-09 DIAGNOSIS — I10 Essential (primary) hypertension: Secondary | ICD-10-CM

## 2022-02-09 DIAGNOSIS — R0789 Other chest pain: Secondary | ICD-10-CM | POA: Insufficient documentation

## 2022-02-09 DIAGNOSIS — Z79899 Other long term (current) drug therapy: Secondary | ICD-10-CM | POA: Insufficient documentation

## 2022-02-09 DIAGNOSIS — I2510 Atherosclerotic heart disease of native coronary artery without angina pectoris: Secondary | ICD-10-CM | POA: Diagnosis not present

## 2022-02-09 DIAGNOSIS — I110 Hypertensive heart disease with heart failure: Secondary | ICD-10-CM | POA: Insufficient documentation

## 2022-02-09 DIAGNOSIS — I5022 Chronic systolic (congestive) heart failure: Secondary | ICD-10-CM | POA: Diagnosis not present

## 2022-02-09 DIAGNOSIS — R69 Illness, unspecified: Secondary | ICD-10-CM | POA: Diagnosis not present

## 2022-02-09 DIAGNOSIS — F1010 Alcohol abuse, uncomplicated: Secondary | ICD-10-CM | POA: Diagnosis not present

## 2022-02-09 LAB — BASIC METABOLIC PANEL
Anion gap: 12 (ref 5–15)
BUN: 10 mg/dL (ref 6–20)
CO2: 25 mmol/L (ref 22–32)
Calcium: 9.6 mg/dL (ref 8.9–10.3)
Chloride: 100 mmol/L (ref 98–111)
Creatinine, Ser: 1.07 mg/dL (ref 0.61–1.24)
GFR, Estimated: >60 mL/min (ref >60)
Glucose, Bld: 115 mg/dL — ABNORMAL HIGH (ref 70–99)
Potassium: 3.9 mmol/L (ref 3.5–5.1)
Sodium: 137 mmol/L (ref 135–145)

## 2022-02-09 LAB — CBC
HCT: 40.7 % (ref 39.0–52.0)
Hemoglobin: 14.2 g/dL (ref 13.0–17.0)
MCH: 31.4 pg (ref 26.0–34.0)
MCHC: 34.9 g/dL (ref 30.0–36.0)
MCV: 90 fL (ref 80.0–100.0)
Platelets: 238 10*3/uL (ref 150–400)
RBC: 4.52 MIL/uL (ref 4.22–5.81)
RDW: 13 % (ref 11.5–15.5)
WBC: 7.6 10*3/uL (ref 4.0–10.5)
nRBC: 0 % (ref 0.0–0.2)

## 2022-02-09 LAB — BRAIN NATRIURETIC PEPTIDE: B Natriuretic Peptide: 96.9 pg/mL (ref 0.0–100.0)

## 2022-02-09 MED ORDER — CARVEDILOL 12.5 MG PO TABS: 12.5000 mg | ORAL_TABLET | Freq: Two times a day (BID) | ORAL | 3 refills | Status: DC

## 2022-02-09 MED ORDER — DAPAGLIFLOZIN PROPANEDIOL 10 MG PO TABS: 10.0000 mg | ORAL_TABLET | Freq: Every day | ORAL | 11 refills | Status: DC

## 2022-02-09 MED ORDER — FUROSEMIDE 40 MG PO TABS: 40.0000 mg | ORAL_TABLET | ORAL | 1 refills | Status: DC | PRN

## 2022-02-09 NOTE — Progress Notes (Signed)
Advanced Heart Failure Clinic Note    PCP: Demetrius Revel, NP Primary Cardilogist: Dr. Domenic Polite HF Cardiologist: Dr. Haroldine Laws  HPI: Mr Craig Hanson is a 52 y.o. male with a hx of EtOH abuse, chronic systolic HF, and pericardial effusion.    Admitted 02/06/20 with acute heart failure. R/LHC which showed minimal nonobstructive CAD, severe NICM EF <20% and markedly elevated filling pressures w/ preserved CO.1/29, co-ox dropped to 43% and milrinone added. Diuresed ~60 lb. cMRI showed severe biventricular dysfunction c/w NICM. Discharge weight 183 lbs.  Here for f/u. Reports full med compliance. Feels fine. Complaining of intermittent chest pain with exertion resolves with rest. Reports weight gain but believes it is because he eats too much. Stays active at home. SOB only with exertion, otherwise able to do everything without issues. Denies CP today.    Cardiac Studies: cMRI 8/23: EF 31%,  No definitive evidence for prior MI, myocarditis, or infiltrative disease.  Echo 3/23: EF 30-35% small effusion   cMRI (2/22): severe biventricular dysfunction and NICM. Moderate to large effusion. No tamponade.  R/LHC 02/13/20: Ost LAD to Prox LAD lesion is 20% stenosed, Ost Cx to Prox Cx lesion is 20% stenosed. Findings:  Ao = 110/84 (95) LV = 106/33 RA =  20 RV = 59/29 PA = 62/20 (43) PCW = 38 Fick cardiac output/index = 5.1/2.3 SVR = 1186 PVR = 1.0 WU FA sat = 96% PA sat = 64%, 68% SVC sat = 66% PAPi = 2.1   Assessment: 1. Minimal nonobstructive CAD 2. 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 Markedly elevated filling pressures with preserved cardiac output   Echo (02/07/20): EF 20-25%, global hypokinesis, grade III dd, severe RV dysfunction, mod LAE, moderate pericardial effusion  ROS: All systems negative except as listed in HPI, PMH and Problem List.  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  Tobacco Use   Smoking status: Never   Smokeless tobacco: Never  Vaping Use   Vaping Use: Never used  Substance and Sexual Activity   Alcohol use: Not Currently    Alcohol/week: 14.0 - 21.0 standard drinks of alcohol    Types: 14 - 21 Cans of beer per week    Comment: 2-3 shots of liquor a few times a week   Drug use: Never   Sexual activity: Yes  Other Topics Concern   Not on file  Social History Narrative   Not on file   Social Determinants of Health   Financial Resource Strain: Not on file  Food Insecurity: Not on file  Transportation Needs: Not on file  Physical Activity: Not on file  Stress: Not on file  Social Connections: Not on file  Intimate Partner Violence: Not on file   FH:  Family History  Problem Relation Age of Onset   Hypertension Mother    Heart disease Father    Stroke Father    Colon cancer Neg Hx    Colonic polyp Neg Hx     Past Medical History:  Diagnosis Date   Acute CHF (congestive heart failure) ([place]) 02/06/2020   Asthma    GERD (gastroesophageal reflux disease)    Pericardial effusion    Systolic HF (heart failure) ([place])  Current Outpatient Medications  Medication Sig Dispense Refill   carvedilol (COREG) 6.25 MG tablet TAKE 1 TABLET BY MOUTH TWICE A DAY 180 tablet 3   furosemide (LASIX) 40 MG tablet Take 1 tablet (40 mg total) by mouth daily as needed. 90 tablet 1   rosuvastatin (CRESTOR) 40 MG tablet Take 1 tablet (40 mg total) by mouth daily. 90 tablet 3   sacubitril-valsartan (ENTRESTO) 97-103 MG Take 1 tablet by mouth 2 (two) times daily. 60 tablet 11   spironolactone (ALDACTONE) 25 MG tablet Take 1 tablet (25 mg total) by mouth daily. 90 tablet 3   No current facility-administered medications for this encounter.    Vitals:   02/09/22 1011  BP: (!) 146/90  Pulse: 85  SpO2: 97%  Weight: 108  kg (238 lb 3.2 oz)   Wt Readings from Last 3 Encounters:  02/09/22 108 kg (238 lb 3.2 oz)  01/24/22 106.5 kg (234 lb 12.8 oz)  11/02/21 108.9 kg (240 lb)   EKG: NSR 91 bpm  PHYSICAL EXAM: General:  well appearing.  No respiratory difficulty HEENT: normal Neck: supple. No JVD. Carotids 2+ bilat; no bruits. No lymphadenopathy or thyromegaly appreciated. Cor: PMI nondisplaced. Regular rate & rhythm. No rubs, gallops or murmurs. Lungs: clear Abdomen: soft, nontender, nondistended. No hepatosplenomegaly. No bruits or masses. Good bowel sounds. Extremities: no cyanosis, clubbing, rash, edema  Neuro: alert & oriented x 3, cranial nerves grossly intact. moves all 4 extremities w/o difficulty. Affect pleasant.    ASSESSMENT & PLAN: 1. Chronic systolic HF with severe biventricular dysfunction due to NICM - 01/2020 Echo EF 20% severe RV dysfunction moderate pericardial effusion, suspect ETOH related stopped drinking 3 months ago.  - Cath 02/13/20 with minimal CAD and massively volume overload. - cMRI 2/22 EF 17% RV Severe dysfunction, ? Acute myocarditis possible sarcoid. Wall thinning.   - ECHO 07/09/20 with EF 25-30% - Echo 3/23: EF 30-35% small effusion  - cMRI EF 31%. No definitive evidence for prior MI, myocarditis, or infiltrative diseas - NYHA II. Volume status appears stable.  - Has been taking lasix daily, with restarting farxiga will switch back to PRN for weight >235 at home. - Will restart Farxiga 10 mg daily, unknown why it was stopped. - Continue Entresto 97/103 bid  -Continue spironolactone 25 mg daily - Increase coreg to 12.5 mg  - Check labs  2. Moderate-Large pericardial effusion. - Small on echo 3/23 - small to moderate 8/23   3. Intt chest pain - reports intt CP with exertion, resolves with rest - LHC 22' minimal CAD - EKG today NSR   4. HTN  - Blood pressure a bit high.  - consider adding bidil next  5. ETOH abuse - Still drinking ~3 beers a week, encouraged  to cut back  F/u with echo in 6 months. F/u in 1 month with pharmacy.   Earnie Larsson AGACNP-BC   02/09/22  Patient seen and examined with the above-signed Advanced Practice Provider and/or Housestaff. I personally reviewed laboratory data, imaging studies and relevant notes. I independently examined the patient and formulated the important aspects of the plan. I have edited the note to reflect any of my changes or salient points. I have personally discussed the plan with the patient and/or family.  Overall improved. NYHA II. Volume status ok. Recent cMRI EF 31%. Still drinking ETOH but said he has cut back.   General:  Well appearing. No resp difficulty HEENT: normal Neck: supple. no JVD. Carotids 2+ bilat;  no bruits. No lymphadenopathy or thryomegaly appreciated. Cor: PMI nondisplaced. Regular rate & rhythm. No rubs, gallops or murmurs. Lungs: clear Abdomen: obese soft, nontender, nondistended. No hepatosplenomegaly. No bruits or masses. Good bowel sounds. Extremities: no cyanosis, clubbing, rash, edema Neuro: alert & orientedx3, cranial nerves grossly intact. moves all 4 extremities w/o difficulty. Affect pleasant  Overall improved but EF < 35%. Restart Marcelline Deist. Increase carvedilol. F/u with PharmD in 1 month to recheck med adherence and add Bidil if needed for HTN.   We discussed ICD but he wants to hold off for now. Stressed need for complete ETOH cessation.   Arvilla Meres, MD  11:27 AM

## 2022-02-09 NOTE — Progress Notes (Signed)
Medication Samples have been provided to the patient.  Drug name: Wilder Glade       Strength: 10mg         Qty: 4 boxes  LOT: [id number]  Exp.Date: 09-15-24  Dosing instructions: take 1 tablet daily  The patient has been instructed regarding the correct time, dose, and frequency of taking this medication, including desired effects and most common side effects.   Craig Hanson M Aideen Fenster 11:28 AM 02/09/2022

## 2022-02-09 NOTE — Patient Instructions (Addendum)
INCREASE Carvedilol to 12.5mg  Twice daily  CHANGE Lasix to as needed for weight gain of 3lb in 24hours, 5lb in a week, swelling or shortness of breath   START Farxiga 10 mg daily.  Labs done today, your results will be available in MyChart, we will contact you for abnormal readings.  Your physician has requested that you have an echocardiogram. Echocardiography is a painless test that uses sound waves to create images of your heart. It provides your doctor with information about the size and shape of your heart and how well your heart's chambers and valves are working. This procedure takes approximately one hour. There are no restrictions for this procedure. Please do NOT wear cologne, perfume, aftershave, or lotions (deodorant is allowed). Please arrive 15 minutes prior to your appointment time.  Please follow up with our heart failure pharmacist in 1 month  Your physician recommends that you schedule a follow-up appointment in: 6 months with an echocardiogram ( July 2024)  ** please call the office in May to arrange your follow up appointment. **  If you have any questions or concerns before your next appointment please send [place] a message through [place] or call our office at [phone].    TO LEAVE A MESSAGE FOR THE NURSE SELECT OPTION 2, PLEASE LEAVE A MESSAGE INCLUDING: YOUR NAME DATE OF BIRTH CALL BACK NUMBER REASON FOR CALL**this is important as we prioritize the call backs  YOU WILL RECEIVE A CALL BACK THE SAME DAY AS LONG AS YOU CALL BEFORE 4:00 PM  At the [facility], you and your health needs are our priority. As part of our continuing mission to provide you with exceptional heart care, we have created designated Provider Care Teams. These Care Teams include your primary Cardiologist (physician) and Advanced Practice Providers (APPs- Physician Assistants and Nurse Practitioners) who all work together to provide you with the care you need, when you need it.    You may see any of the following providers on your designated Care Team at your next follow up: Dr Glori Bickers Dr Loralie Champagne Dr. Roxana Hires, NP Lyda Jester, [place] [facility] [place], [place] Forestine Na, NP Audry Riles, PharmD   Please be sure to bring in all your medications bottles to every appointment.

## 2022-02-13 ENCOUNTER — Other Ambulatory Visit (HOSPITAL_COMMUNITY): Payer: Self-pay

## 2022-02-22 ENCOUNTER — Other Ambulatory Visit (HOSPITAL_COMMUNITY): Payer: Self-pay

## 2022-02-23 ENCOUNTER — Other Ambulatory Visit (HOSPITAL_COMMUNITY): Payer: Self-pay

## 2022-02-27 ENCOUNTER — Telehealth (HOSPITAL_COMMUNITY): Payer: Self-pay

## 2022-02-27 ENCOUNTER — Other Ambulatory Visit (HOSPITAL_COMMUNITY): Payer: Self-pay

## 2022-02-27 ENCOUNTER — Other Ambulatory Visit (HOSPITAL_COMMUNITY): Payer: Self-pay | Admitting: *Deleted

## 2022-02-27 MED ORDER — EMPAGLIFLOZIN 10 MG PO TABS: 10.0000 mg | ORAL_TABLET | Freq: Every day | ORAL | 11 refills | Status: DC

## 2022-02-27 NOTE — Telephone Encounter (Signed)
Advanced Heart Failure Patient Advocate Encounter  Patient has updated insurance coverage, Wilder Glade is not covered by this plan.  Prior authorization is required for [place]. PA submitted and APPROVED on 02/22/22.  Key [id number] Effective: 02/22/22 - 02/23/23  Called patient to inform him of the change in medication, new rx for Vania Rea is being sent to patient preferred pharmacy.  Clista Bernhardt, CPhT Rx Patient Advocate Phone: [phone]

## 2022-03-01 IMAGING — DX DG CHEST 1V PORT
1 series · 1 of 1 positions shown · non-contrast
Comparison: None.

CLINICAL DATA: Asthma, dyspnea

EXAM:
PORTABLE CHEST 1 VIEW

[chest ap]
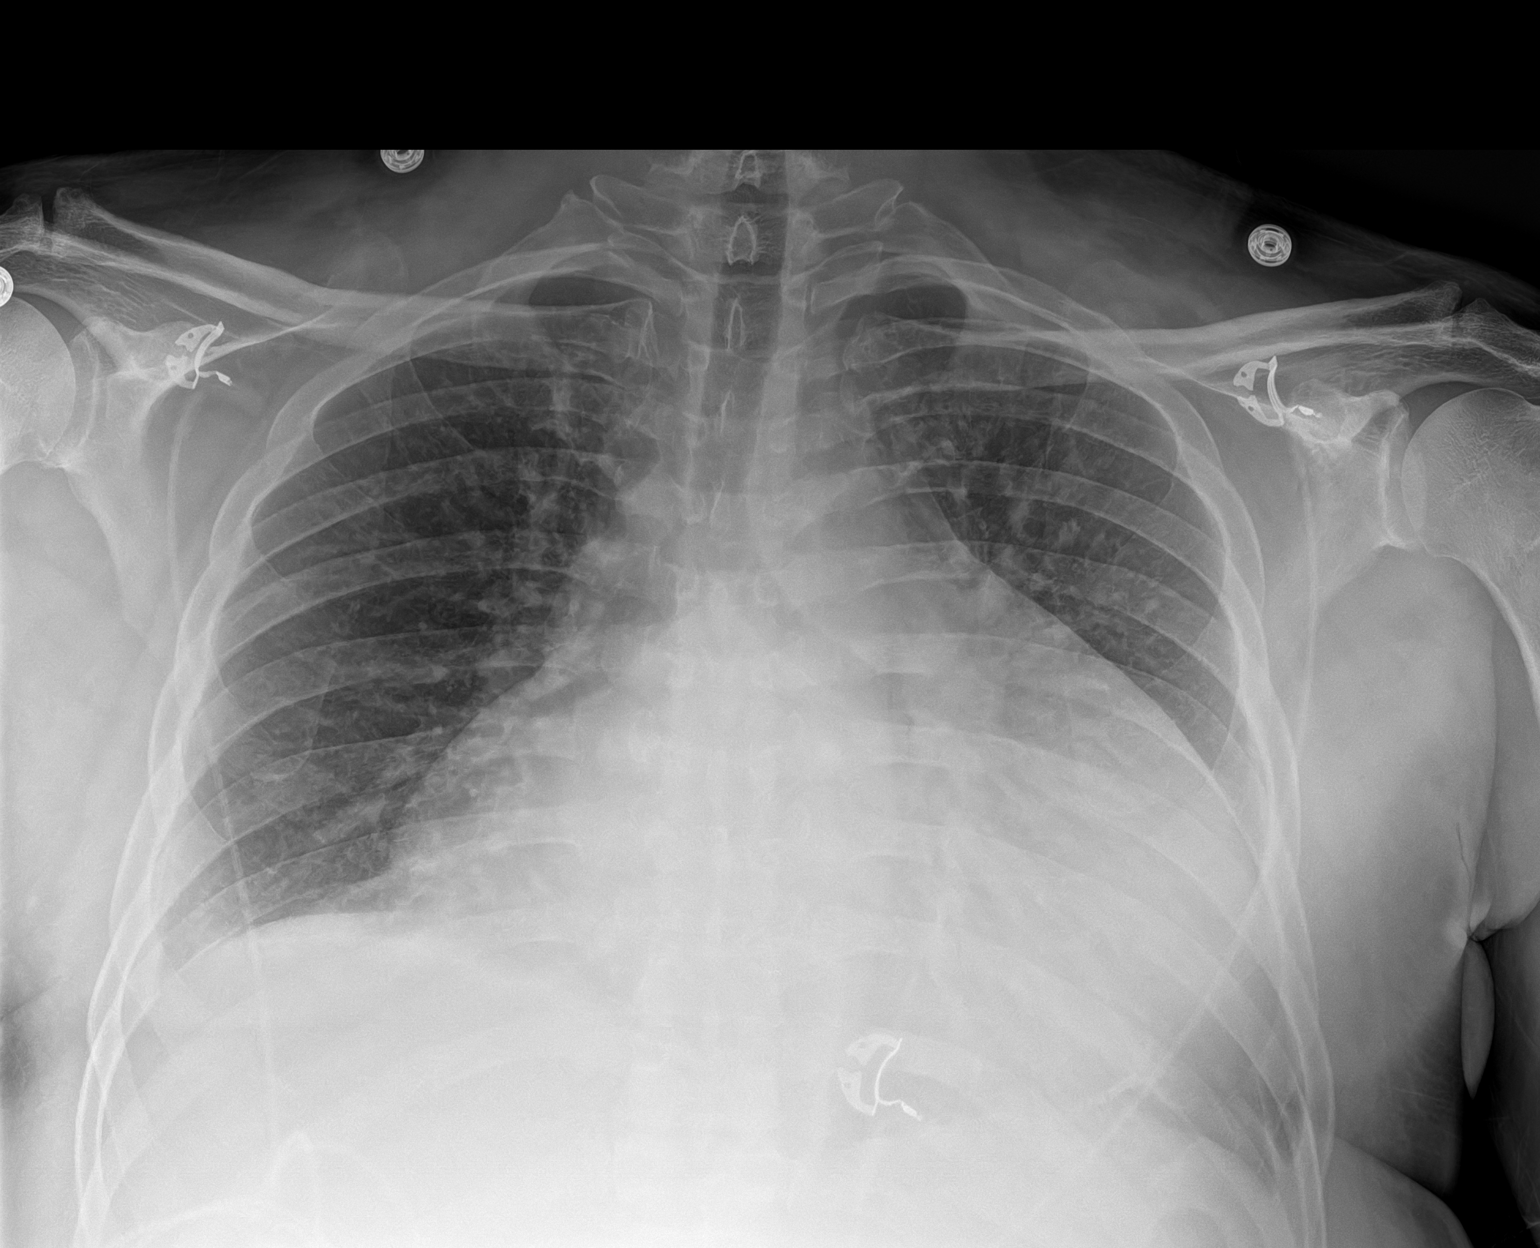

[1 of 1 positions shown; findings below may reference images not displayed]

FINDINGS: Lungs are clear. No pneumothorax or pleural effusion. Moderate
cardiomegaly is present. Pulmonary vascularity is normal. No acute
bone abnormality.
IMPRESSION: Moderate cardiomegaly.

## 2022-03-11 IMAGING — MR MR CARD MORPHOLOGY WO/W CM
45 of 48 series · 45 of 48 positions shown · IV contrast (Contrast agent)
Comparison: none

CLINICAL DATA: 49M with acute systolic heart failure

EXAM:
CARDIAC MRI
TECHNIQUE: The patient was scanned on a 1.5 Tesla Siemens magnet. A dedicated
cardiac coil was used. Functional imaging was done using Fiesta
sequences. [DATE], and 4 chamber views were done to assess for RWMA's.
Modified Casillas rule using a short axis stack was used to
calculate an ejection fraction on a dedicated work station using
Circle software. The patient received 10 cc of Gadavist. After 10
minutes inversion recovery sequences were used to assess for
infiltration and scar tissue.
CONTRAST:  10 cc  of Gadavist

[Series 4: [id number] · axial · 8.0mm · 1.56mm/px · 1 of 18 slices shown]
[im 1/18]
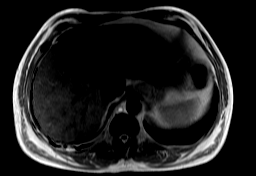

[Series 8: bSSFP · oblique · 8.0mm · 1.61mm/px · 1 of 25 slices shown (1 of 24)]
[im 1/25]
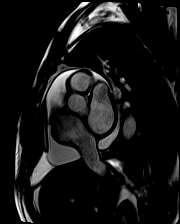

[Series 9: bSSFP · oblique · 8.0mm · 1.61mm/px · 1 of 25 slices shown (2 of 24)]
[im 1/25]
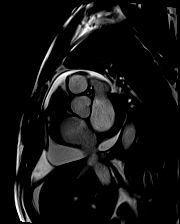

[Series 10: bSSFP · oblique · 8.0mm · 1.61mm/px · 1 of 25 slices shown (3 of 24)]
[im 1/25]
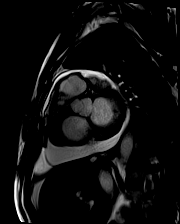

[Series 11: bSSFP · oblique · 8.0mm · 1.61mm/px · 1 of 25 slices shown (4 of 24)]
[im 1/25]
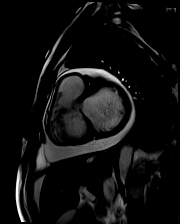

[Series 12: bSSFP · oblique · 8.0mm · 1.61mm/px · 1 of 25 slices shown (5 of 24)]
[im 1/25]
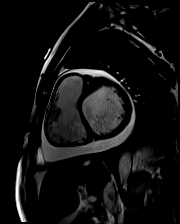

[Series 13: bSSFP · oblique · 8.0mm · 1.61mm/px · 1 of 25 slices shown (6 of 24)]
[im 1/25]
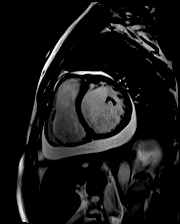

[Series 14: bSSFP · oblique · 8.0mm · 1.61mm/px · 1 of 25 slices shown (7 of 24)]
[im 1/25]
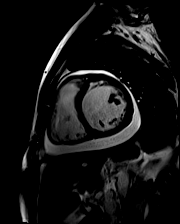

[Series 15: bSSFP · oblique · 8.0mm · 1.61mm/px · 1 of 25 slices shown (8 of 24)]
[im 1/25]
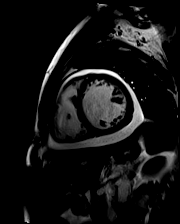

[Series 16: bSSFP · oblique · 8.0mm · 1.61mm/px · 1 of 25 slices shown (9 of 24)]
[im 1/25]
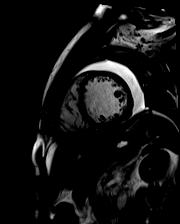

[Series 17: bSSFP · oblique · 8.0mm · 1.61mm/px · 1 of 25 slices shown (10 of 24)]
[im 1/25]
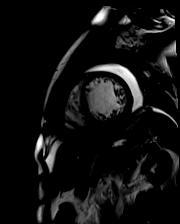

[Series 18: bSSFP · oblique · 8.0mm · 1.61mm/px · 1 of 25 slices shown (11 of 24)]
[im 1/25]
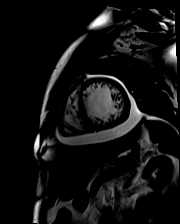

[Series 19: bSSFP · oblique · 8.0mm · 1.61mm/px · 1 of 25 slices shown (12 of 24)]
[im 1/25]
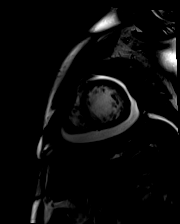

[Series 20: bSSFP · oblique · 8.0mm · 1.61mm/px · 1 of 25 slices shown (13 of 24)]
[im 1/25]
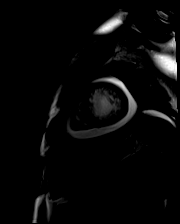

[Series 21: bSSFP · oblique · 8.0mm · 1.61mm/px · 1 of 25 slices shown (14 of 24)]
[im 1/25]
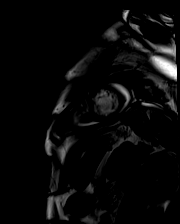

[Series 22: bSSFP · oblique · 8.0mm · 1.61mm/px · 1 of 25 slices shown (15 of 24)]
[im 1/25]
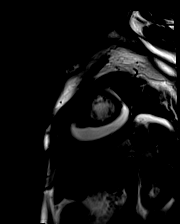

[Series 23: bSSFP · oblique · 8.0mm · 1.61mm/px · 1 of 25 slices shown (16 of 24)]
[im 1/25]
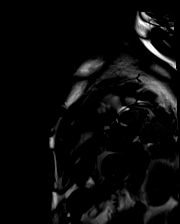

[Series 24: bSSFP · oblique · 8.0mm · 1.61mm/px · 1 of 25 slices shown (17 of 24)]
[im 1/25]
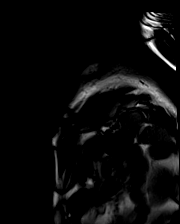

[Series 25: bSSFP · oblique · 8.0mm · 1.61mm/px · 1 of 25 slices shown (18 of 24)]
[im 1/25]
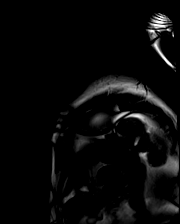

[Series 26: bSSFP · oblique · 8.0mm · 1.61mm/px · 1 of 25 slices shown (19 of 24)]
[im 1/25]
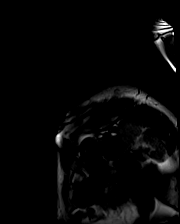

[Series 27: bSSFP · oblique · 8.0mm · 1.61mm/px · 1 of 25 slices shown (20 of 24)]
[im 1/25]
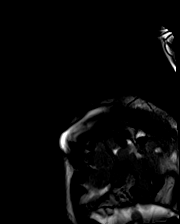

[Series 28: bSSFP · oblique · 6.0mm · 1.41mm/px · 1 of 25 slices shown (21 of 24)]
[im 1/25]
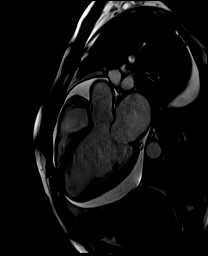

[Series 29: bSSFP · coronal · 6.0mm · 1.41mm/px · 1 of 25 slices shown (22 of 24)]
[im 1/25]
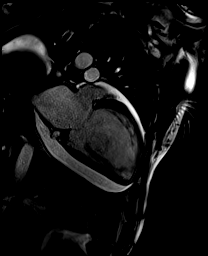

[Series 30: bSSFP · axial · 6.0mm · 1.41mm/px · 1 of 25 slices shown (23 of 24)]
[im 1/25]
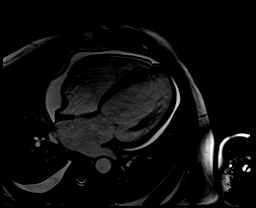

[Series 31: STIR · oblique · 8.0mm · 1.92mm/px · 1 of 18 slices shown]
[im 1/18]
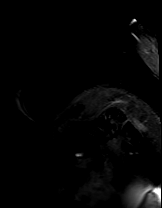

[Series 32: (id)_long_t1 · oblique · 8.0mm · 2.08mm/px · 1 of 24 slices shown]
[im 1/24]
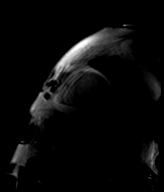

[Series 33: (id)_long_t1_moco · oblique · 8.0mm · 2.08mm/px · 1 of 24 slices shown]
[im 1/24]
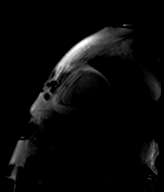

[Series 34: (id)_long_t1_moco_t1 · oblique · 8.0mm · 2.08mm/px · 1 of 6 slices shown]
[im 1/6]
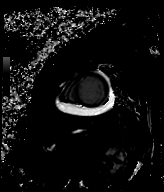

[Series 36: (id)_trufi · oblique · 8.0mm · 2.08mm/px · 1 of 9 slices shown]
[im 1/9]
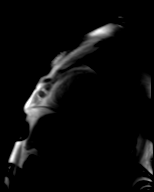

[Series 37: (id)_trufi_moco · oblique · 8.0mm · 2.08mm/px · 1 of 9 slices shown]
[im 1/9]
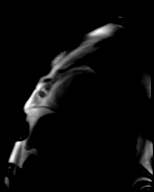

[Series 38: (id)_trufi_moco_t2 · oblique · 8.0mm · 2.08mm/px · 1 of 3 slices shown]
[im 1/3]
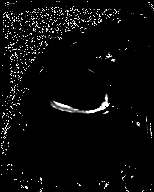

[Series 40: pre short axis · oblique · non-contrast · 8.0mm · 2.50mm/px · 1 of 10 slices shown (1 of 6)]
[im 1/10]
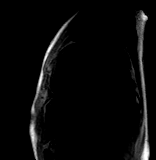

[Series 41: pre short axis · oblique · non-contrast · 8.0mm · 2.50mm/px · 1 of 10 slices shown (2 of 6)]
[im 1/10]
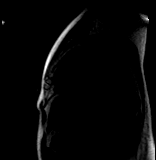

[Series 42: pre short axis · oblique · non-contrast · 8.0mm · 2.50mm/px · 1 of 10 slices shown (3 of 6)]
[im 1/10]
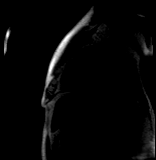

[Series 43: pre short axis · oblique · non-contrast · 8.0mm · 2.50mm/px · 1 of 10 slices shown (4 of 6)]
[im 1/10]
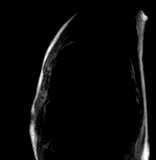

[Series 44: pre short axis · oblique · non-contrast · 8.0mm · 2.50mm/px · 1 of 10 slices shown (5 of 6)]
[im 1/10]
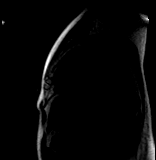

[Series 45: pre short axis · oblique · non-contrast · 8.0mm · 2.50mm/px · 1 of 10 slices shown (6 of 6)]
[im 1/10]
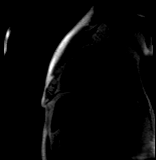

[Series 46: rest short axis · oblique · 8.0mm · 2.25mm/px · 1 of 80 slices shown (1 of 6)]
[im 1/80]
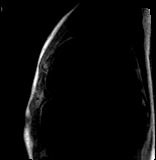

[Series 47: rest short axis · oblique · 8.0mm · 2.25mm/px · 1 of 80 slices shown (2 of 6)]
[im 1/80]
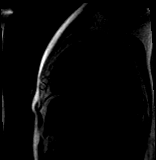

[Series 48: rest short axis · oblique · 8.0mm · 2.25mm/px · 1 of 80 slices shown (3 of 6)]
[im 1/80]
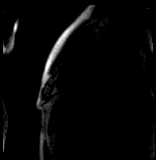

[Series 49: rest short axis · oblique · 8.0mm · 2.25mm/px · 1 of 80 slices shown (4 of 6)]
[im 1/80]
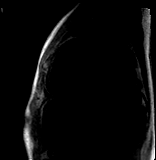

[Series 50: rest short axis · oblique · 8.0mm · 2.25mm/px · 1 of 80 slices shown (5 of 6)]
[im 1/80]
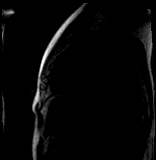

[Series 51: rest short axis · oblique · 8.0mm · 2.25mm/px · 1 of 80 slices shown (6 of 6)]
[im 1/80]
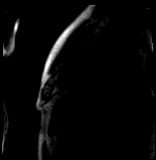

[Series 52: bSSFP · coronal · 6.0mm · 1.41mm/px · 1 of 25 slices shown (24 of 24)]
[im 1/25]
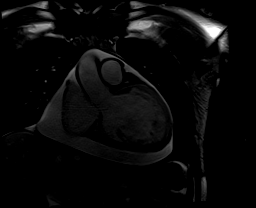

[Series 53: cine rvit · oblique · 6.0mm · 1.41mm/px · 1 of 25 slices shown]
[im 1/25]
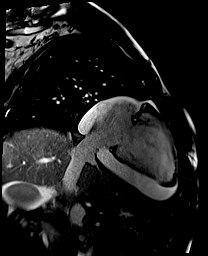

[45 of 48 positions shown; findings below may reference images not displayed]

FINDINGS: Left ventricle:

- Severe dilatation

- Severe systolic dysfunction

- Significant elevation in native T1 (4415 ms) and ECV (40%).
Regional elevation in T2, up to 59ms in mid anterolateral wall

- RV insertion site LGE

- Basal septal midwall LGE

LV EF: 17% (Normal 56-78%)

Absolute volumes:

LV EDV: 364mL (Normal 77-195 mL)

LV ESV: 303mL (Normal 19-72 mL)

LV SV: 61mL (Normal 51-133 mL)

CO: 6.7L/min (Normal 2.8-8.8 L/min)

Indexed volumes:

LV EDV: 176mL/sq-m (Normal 47-92 mL/sq-m)

LV ESV: 147mL/sq-m (Normal 13-30 mL/sq-m)

LV SV: 30mL/sq-m (Normal 32-62 mL/sq-m)

CI: 3.2L/min/sq-m (Normal 1.7-4.2 L/min/sq-m)

Right ventricle: Moderate dilatation.  Severe systolic dysfunction

RV EF:  24% (Normal 47-74%)

Absolute volumes:

RV EDV: 261mL (Normal 88-227 mL)

RV ESV: 198mL (Normal 23-103 mL)

RV SV: 63mL (Normal 52-138 mL)

CO: 6.9L/min (Normal 2.8-8.8 L/min)

Indexed volumes:

RV EDV: 126mL/sq-m (Normal 55-105 mL/sq-m)

RV ESV: 96mL/sq-m (Normal 15-43 mL/sq-m)

RV SV: 31mL/sq-m (Normal 32-64 mL/sq-m)

CI: 3.3L/min/sq-m (Normal 1.7-4.2 L/min/sq-m)

Left atrium: Moderate enlargement

Right atrium: Mild enlargement

Mitral valve: Mild to moderate regurgitation visually, was not
quantified

Aortic valve: Tricuspid.   No regurgitation

Tricuspid valve: Mild regurgitation

Pulmonic valve: No regurgitation

Aorta: Normal proximal ascending aorta

Pericardium: Large circumferential effusion measuring up to 20mm
adjacent to LV/RV inferior walls and LV lateral wall
IMPRESSION: 1.  Severe LV dilatation with severe systolic dysfunction (EF 17%).

2. Moderate RV dilatation with severe systolic dysfunction (EF 24%).

3. Basal septal midwall LGE, which is seen in nonischemic
cardiomyopathies and associated with worse prognosis

4. RV insertion site LGE, which is a nonspecific scar pattern often
seen in setting of elevated pulmonary pressures

5. Significant elevation in native T1 (4415 ms), ECV (40%), and T2
(59ms in mid anterolateral wall) suggesting myocardial edema. While
meets criteria for acute myocarditis, LV wall thinning suggests more
chronic process. Cardiac sarcoid is also on the differential, as can
cause myocardial edema, LGE pattern, and wall thinning. Would
consider cardiac PET scan to evaluate for active sarcoid.

6.  Large circumferential pericardial effusion measuring up to 20mm

## 2022-03-14 NOTE — Progress Notes (Incomplete)
***  In Progress***    Advanced Heart Failure Clinic Note   PCP: Dr. Posey Pronto Primary Cardilogist: Dr. Domenic Polite HF Cardiologist: Dr. Haroldine Laws  HPI:   Mr Rumley is a 52 y.o. male with a hx of EtOH abuse, chronic systolic HF, and pericardial effusion.    Admitted 02/06/20 with acute heart failure. 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 Diuresed ~60 lb. cMRI showed severe biventricular dysfunction c/w NICM. Discharge weight 183 lbs.   Last seen by CHF MD on 02/09/22. Reported full medication compliance. Felt fine, but complained of intermittent chest pain with exertion that resolved with rest. Reported weight gain but believed it is because he eats too much. Reported SOB only with exertion, otherwise was able to do everything without issues.  At that visit, Wilder Glade was added and carvedilol was increased. Samples for Wilder Glade were provided.  Today he returns to HF clinic for pharmacist medication titration. At last visit with MD ***.   Overall feeling ***. Dizziness, lightheadedness, fatigue:  Chest pain or palpitations:  How is your breathing?: *** SOB: Able to complete all ADLs. Activity level ***  Weight at home pounds. Takes furosemide/torsemide/bumex *** mg *** daily.  LEE PND/Orthopnea  Appetite *** Low-salt diet:   Physical Exam Cost/affordability of meds   HF Medications: Carvedilol 12.5 mg BID Entresto 87/103 mg BID Spironolactone 25 mg daily Jardiance 10 mg daily Furosemide 40 mg prn  Has the patient been experiencing any side effects to the medications prescribed?  {YES [employer]  Does the patient have any problems obtaining medications due to transportation or finances?   {YES NO:22349}  Understanding of regimen: {excellent/good/fair/poor:19665} Understanding of indications:  {excellent/good/fair/poor:19665} Potential of compliance: {excellent/good/fair/poor:19665} Patient understands to avoid NSAIDs. Patient understands to avoid decongestants.    Pertinent Lab Values (02/09/22): Serum creatinine 1.07, BUN 10, Potassium 3.9, Sodium 137, BNP 96.9   Vital Signs: Weight: *** (last clinic weight: 238 lbs) Blood pressure: ***  Heart rate: ***   Assessment/Plan: 1. Chronic systolic HF with severe biventricular dysfunction due to NICM - 01/2020 Echo EF 20% severe RV dysfunction moderate pericardial effusion, suspect ETOH related. Pt stopped drinking 10/2021. - Cath 02/13/20 with minimal CAD and massive volume overload. - cMRI 02/2020 with EF of 17% and severe RV dysfunction, ? Acute myocarditis possible sarcoid. Wall thinning.   - Echo 07/09/20 with EF 25-30% - Echo 03/2021: EF 30-35% small effusion  - cMRI 08/2021 with EF 31%. No definitive evidence for prior MI, myocarditis, or infiltrative disease - NYHA II. Euvolemic today. - Continue prn Lasix. - Continue carvedilol 12.5 mg BID - Continue Entresto 97/103 bid  -Continue spironolactone 25 mg daily - Figure out [place] vs Jardiance   2. Moderate-Large pericardial effusion. - Small on echo 03/2021 - Small to moderate 08/2021   3. Intt chest pain - Reports intermittent CP with exertion, resolves with rest - LHC 2022 with minimal CAD   4. HTN  - Blood pressure a bit high.  - Consider adding bidil next   5. ETOH abuse - Still drinking ~3 beers a week, encouraged to cut back   F/u with echo in 6 months. F/u in 1 month with pharmacy.   Follow up ***   Audry Riles, PharmD, BCPS, BCCP, CPP Heart Failure Clinic Pharmacist [phone]

## 2022-03-15 ENCOUNTER — Other Ambulatory Visit (HOSPITAL_COMMUNITY): Payer: Self-pay

## 2022-03-15 ENCOUNTER — Ambulatory Visit (HOSPITAL_COMMUNITY)
Admission: RE | Admit: 2022-03-15 | Discharge: 2022-03-15 | Disposition: A | Discharge status: Home / Self Care | Payer: 59 | Source: Ambulatory Visit | Attending: Internal Medicine | Admitting: Internal Medicine

## 2022-03-15 VITALS — BP 138/92 | HR 79

## 2022-03-15 DIAGNOSIS — I5022 Chronic systolic (congestive) heart failure: Secondary | ICD-10-CM

## 2022-03-15 DIAGNOSIS — R0789 Other chest pain: Secondary | ICD-10-CM | POA: Insufficient documentation

## 2022-03-15 DIAGNOSIS — Z79899 Other long term (current) drug therapy: Secondary | ICD-10-CM | POA: Diagnosis not present

## 2022-03-15 DIAGNOSIS — I428 Other cardiomyopathies: Secondary | ICD-10-CM | POA: Diagnosis not present

## 2022-03-15 DIAGNOSIS — I2510 Atherosclerotic heart disease of native coronary artery without angina pectoris: Secondary | ICD-10-CM | POA: Diagnosis not present

## 2022-03-15 DIAGNOSIS — I110 Hypertensive heart disease with heart failure: Secondary | ICD-10-CM | POA: Insufficient documentation

## 2022-03-15 DIAGNOSIS — Z7984 Long term (current) use of oral hypoglycemic drugs: Secondary | ICD-10-CM | POA: Diagnosis not present

## 2022-03-15 DIAGNOSIS — F1010 Alcohol abuse, uncomplicated: Secondary | ICD-10-CM | POA: Insufficient documentation

## 2022-03-15 LAB — BASIC METABOLIC PANEL
Anion gap: 11 (ref 5–15)
BUN: 8 mg/dL (ref 6–20)
CO2: 26 mmol/L (ref 22–32)
Calcium: 9.2 mg/dL (ref 8.9–10.3)
Chloride: 100 mmol/L (ref 98–111)
Creatinine, Ser: 1.16 mg/dL (ref 0.61–1.24)
GFR, Estimated: >60 mL/min (ref >60)
Glucose, Bld: 99 mg/dL (ref 70–99)
Potassium: 3.7 mmol/L (ref 3.5–5.1)
Sodium: 137 mmol/L (ref 135–145)

## 2022-03-15 MED ORDER — CARVEDILOL 25 MG PO TABS: 25.0000 mg | ORAL_TABLET | Freq: Two times a day (BID) | ORAL | 11 refills | Status: DC

## 2022-03-15 NOTE — Patient Instructions (Signed)
It was a pleasure seeing you today!  MEDICATIONS: -We are changing your medications today -Increase carvedilol to 25 mg twice daily -You can take 4 tablets of the 6.25 mg dose twice daily, 2 tablets of the 12.5 mg dose twice daily, or 1 tablet of the 25 mg dose twice daily. -Call if you have questions about your medications.  LABS: -We will call you if your labs need attention.  NEXT APPOINTMENT: Return to clinic in 3 months with APP.  In general, to take care of your heart failure: -Limit your fluid intake to 2 Liters (half-gallon) per day.   -Limit your salt intake to ideally 2-3 grams (2000-3000 mg) per day. -Weigh yourself daily and record, and bring that "weight diary" to your next appointment.  (Weight gain of 2-3 pounds in 1 day typically means fluid weight.) -The medications for your heart are to help your heart and help you live longer.   -Please contact us before stopping any of your heart medications.  Call the clinic at [phone] with questions or to reschedule future appointments.

## 2022-03-15 NOTE — Progress Notes (Signed)
Advanced Heart Failure Clinic Note   PCP: Dr. Posey Pronto Primary Cardilogist: Dr. Domenic Polite HF Cardiologist: Dr. Haroldine Laws  HPI:   Mr Craig Hanson is a 52 y.o. male with a hx of EtOH abuse, chronic systolic HF, and pericardial effusion.    Admitted 02/06/20 with acute heart failure. 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 Diuresed ~60 lb. cMRI showed severe biventricular dysfunction c/w NICM. Discharge weight 183 lbs.   Last seen by CHF MD on 02/09/22. Reported full medication compliance. Felt fine, but complained of intermittent chest pain with exertion that resolved with rest. Reported weight gain but believed it is because he eats too much. Reported SOB only with exertion, otherwise was able to do everything without issues.  At that visit, Wilder Glade was added and carvedilol was increased. Samples for Wilder Glade were provided. On 02/27/22, a prior authorization was approved for Jardiance because Wilder Glade was not preferred by insurance.  Today he returns to HF clinic for pharmacist medication titration. He is overall feeling good. Denies dizziness, lightheadedness, and fatigue. Denies chest pain or palpitations. His breathing has been much better; he still becomes SOB walking up stairs or long distances. He is able to complete all ADLs. Weight at home is 222-235 pounds. Takes furosemide 40 mg prn, averaging 4 times per week. Denies LEE, PND, and orthopnea. Appetite is normal. Does not adhere to low-salt diet.  HF Medications: Carvedilol 12.5 mg BID Entresto 97/103 mg BID Spironolactone 25 mg daily Jardiance 10 mg daily Furosemide 40 mg prn  Has the patient been experiencing any side effects to the medications prescribed?  No  Does the patient have any problems obtaining medications due to transportation or finances?   His copay for Jardiance was $140 dollars. A copay card  for both Jardiance and Delene Loll were provided. He has [employer].  Understanding of regimen: excellent Understanding of indications: excellent Potential of compliance: excellent Patient understands to avoid NSAIDs. Patient understands to avoid decongestants.    Pertinent Lab Values (02/09/22): Serum creatinine 1.07, BUN 10, Potassium 3.9, Sodium 137, BNP 96.9   Vital Signs: Weight: 236.4 lbs (last clinic weight: 238 lbs) Blood pressure: 138/92 mmHg  Heart rate: 79 bpm   Assessment/Plan: 1. Chronic systolic HF with severe biventricular dysfunction due to NICM - 01/2020 Echo EF 20% severe RV dysfunction moderate pericardial effusion, suspect ETOH related. Pt stopped drinking 10/2021. - Cath 02/13/20 with minimal CAD and massive volume overload. - cMRI 02/2020 with EF of 17% and severe RV dysfunction, ? Acute myocarditis possible sarcoid. Wall thinning.   - Echo 07/09/20 with EF 25-30% - Echo 03/2021: EF 30-35% small effusion  - cMRI 08/2021 with EF 31%. No definitive evidence for prior MI, myocarditis, or infiltrative disease - NYHA II. Euvolemic today. He took furosemide this morning. - Continue prn furosemide . - Increase carvedilol to 25 mg BID - Continue Entresto 97/103 bid  -Continue spironolactone 25 mg daily - Continue Jardiance 10 mg daily. Copay card provided - Can consider BiDil at APP visit if BP remains elevated. Patient prefers to increase his carvedilol today rather than start a new medication. Generic BiDil is $40 on his insurance, so it would be more affordable for him to start hydralazine and isosorbide mononitrate as separate components.    2. Moderate-Large pericardial effusion. - Small on echo 03/2021 - Small to moderate 08/2021   3. Intt chest pain - Reports  intermittent CP with exertion, resolves with rest - LHC 2022 with minimal CAD   4. HTN  - Blood pressure a bit high.  - Increase carvedilol as above. Consider adding Bidil next.   5. ETOH  abuse - Still drinking ~3 beers a week.   Follow up echo planned in ~6 months. F/u in 3 months with APP clinic for medication titration.   Audry Riles, PharmD, BCPS, BCCP, CPP Heart Failure Clinic Pharmacist [phone]

## 2022-04-21 ENCOUNTER — Encounter: Payer: Self-pay | Admitting: Internal Medicine

## 2022-04-21 ENCOUNTER — Ambulatory Visit (INDEPENDENT_AMBULATORY_CARE_PROVIDER_SITE_OTHER): Payer: 59 | Admitting: Internal Medicine

## 2022-04-21 VITALS — BP 134/84 | HR 68 | Ht 67.0 in | Wt 239.2 lb

## 2022-04-21 DIAGNOSIS — Z125 Encounter for screening for malignant neoplasm of prostate: Secondary | ICD-10-CM

## 2022-04-21 DIAGNOSIS — I10 Essential (primary) hypertension: Secondary | ICD-10-CM

## 2022-04-21 DIAGNOSIS — E782 Mixed hyperlipidemia: Secondary | ICD-10-CM

## 2022-04-21 DIAGNOSIS — Z0001 Encounter for general adult medical examination with abnormal findings: Secondary | ICD-10-CM | POA: Diagnosis not present

## 2022-04-21 DIAGNOSIS — I5022 Chronic systolic (congestive) heart failure: Secondary | ICD-10-CM | POA: Diagnosis not present

## 2022-04-21 DIAGNOSIS — R7301 Impaired fasting glucose: Secondary | ICD-10-CM

## 2022-04-21 DIAGNOSIS — M19041 Primary osteoarthritis, right hand: Secondary | ICD-10-CM | POA: Diagnosis not present

## 2022-04-21 DIAGNOSIS — M19042 Primary osteoarthritis, left hand: Secondary | ICD-10-CM | POA: Diagnosis not present

## 2022-04-21 DIAGNOSIS — Z23 Encounter for immunization: Secondary | ICD-10-CM | POA: Diagnosis not present

## 2022-04-21 DIAGNOSIS — E559 Vitamin D deficiency, unspecified: Secondary | ICD-10-CM

## 2022-04-21 NOTE — Assessment & Plan Note (Signed)
Followed by CHF clinic On Entresto, Coreg, Jardiance, spironolactone and as needed Lasix Appears to be euvolemic currently Last echo showed LVEF 30-35%

## 2022-04-21 NOTE — Patient Instructions (Signed)
Please continue to take medications as prescribed.  Please continue to follow low salt diet and perform moderate exercise/walking at least 150 mins/week. 

## 2022-04-21 NOTE — Assessment & Plan Note (Signed)
Needs to take Crestor regularly, refilled 

## 2022-04-21 NOTE — Assessment & Plan Note (Addendum)
Physical exam as documented. Fasting blood tests ordered. PCV20 vaccine today. 

## 2022-04-21 NOTE — Assessment & Plan Note (Signed)
Likely due to overuse injury Advised to use topical Voltaren gel Advised to avoid oral NSAIDs 

## 2022-04-21 NOTE — Assessment & Plan Note (Signed)
BP Readings from Last 1 Encounters:  04/21/22 134/84   Well-controlled Counseled for compliance with the medications Advised DASH diet and moderate exercise/walking as tolerated

## 2022-04-21 NOTE — Progress Notes (Signed)
Established Patient Office Visit  Subjective:  Patient ID: Craig Hanson, male    DOB: 08/07/1970  Age: 52 y.o. MRN: [id number]  CC:  Chief Complaint  Patient presents with   Annual Exam    HPI Craig Spikenthony D Conely is a 52 y.o. male with past medical history of HFrEF who presents for annual physical.  He has been doing well overall.  Denies any recent  worsening of leg swelling or dyspnea.  Denies any chest pain, dizziness or palpitations. Followed by CHF clinic.  He complains of bilateral hand swelling and pain, which is chronic and intermittent.  He attributes it to his previous job, where he had to lift heavy objects and move it around. He was advised to use Voltaren gel and take Tylenol as needed.    Past Medical History:  Diagnosis Date   Acute CHF (congestive heart failure) 02/06/2020   Asthma    GERD (gastroesophageal reflux disease)    Pericardial effusion    Systolic HF (heart failure)     Past Surgical History:  Procedure Laterality Date   COLONOSCOPY WITH PROPOFOL N/A 10/31/2021   Procedure: COLONOSCOPY WITH PROPOFOL;  Surgeon: Lanelle Balarver, Charles K, DO;  Location: [facility];  Service: Endoscopy;  Laterality: N/A;  8:30am, asa 3   No prior surgery     RIGHT/LEFT HEART CATH AND CORONARY ANGIOGRAPHY N/A 02/13/2020   Procedure: RIGHT/LEFT HEART CATH AND CORONARY ANGIOGRAPHY;  Surgeon: Dolores PattyBensimhon, Daniel R, MD;  Location: [facility];  Service: Cardiovascular;  Laterality: N/A;    Family History  Problem Relation Age of Onset   Hypertension Mother    Heart disease Father    Stroke Father    Colon cancer Neg Hx    Colonic polyp Neg Hx     Social History   Socioeconomic History   Marital status: Married    Spouse name: Not on file   Number of children: Not on file   Years of education: Not on file   Highest education level: Not on file  Occupational History    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  Tobacco Use   Smoking status: Never    Smokeless tobacco: Never  Vaping Use   Vaping Use: Never used  Substance and Sexual Activity   Alcohol use: Not Currently    Alcohol/week: 14.0 - 21.0 standard drinks of alcohol    Types: 14 - 21 Cans of beer per week    Comment: 2-3 shots of liquor a few times a week   Drug use: Never   Sexual activity: Yes  Other Topics Concern   Not on file  Social History Narrative   Not on file   Social Determinants of Health   Financial Resource Strain: Not on file  Food Insecurity: Not on file  Transportation Needs: Not on file  Physical Activity: Not on file  Stress: Not on file  Social Connections: Not on file  Intimate Partner Violence: Not on file    Outpatient Medications Prior to Visit  Medication Sig Dispense Refill   carvedilol (COREG) 25 MG tablet Take 1 tablet (25 mg total) by mouth 2 (two) times daily. 60 tablet 11   empagliflozin (JARDIANCE) 10 MG TABS tablet Take 1 tablet (10 mg total) by mouth daily before breakfast. 30 tablet 11   furosemide (LASIX) 40 MG tablet Take 1 tablet (40 mg total) by mouth as needed. For weight gain of 3lb in 24 hours or 5lb in a week 90 tablet 1  rosuvastatin (CRESTOR) 40 MG tablet Take 1 tablet (40 mg total) by mouth daily. 90 tablet 3   sacubitril-valsartan (ENTRESTO) 97-103 MG Take 1 tablet by mouth 2 (two) times daily. 60 tablet 11   spironolactone (ALDACTONE) 25 MG tablet Take 1 tablet (25 mg total) by mouth daily. 90 tablet 3   No facility-administered medications prior to visit.    No Known Allergies  ROS Review of Systems  Constitutional:  Negative for chills and fever.  HENT:  Negative for congestion and sore throat.   Eyes:  Negative for pain and discharge.  Respiratory:  Negative for cough and shortness of breath.   Cardiovascular:  Negative for chest pain and palpitations.  Gastrointestinal:  Negative for constipation, diarrhea, nausea and vomiting.  Endocrine: Negative for polydipsia and polyuria.  Genitourinary:  Negative  for dysuria and hematuria.  Musculoskeletal:  Positive for arthralgias (B/l wrist and hand joints with swelling). Negative for neck pain and neck stiffness.  Skin:  Negative for rash.  Neurological:  Negative for dizziness, weakness, numbness and headaches.  Psychiatric/Behavioral:  Negative for agitation and behavioral problems.       Objective:    Physical Exam Vitals reviewed.  Constitutional:      General: He is not in acute distress.    Appearance: He is not diaphoretic.  HENT:     Head: Normocephalic and atraumatic.     Nose: Nose normal.     Mouth/Throat:     Mouth: Mucous membranes are moist.  Eyes:     General: No scleral icterus.    Extraocular Movements: Extraocular movements intact.  Cardiovascular:     Rate and Rhythm: Normal rate and regular rhythm.     Heart sounds: No murmur heard. Pulmonary:     Breath sounds: Normal breath sounds. No wheezing or rales.  Abdominal:     Palpations: Abdomen is soft.     Tenderness: There is no abdominal tenderness.  Musculoskeletal:     Right hand: Swelling (PIP and DIP joints) present.     Left hand: Swelling (PIP and DIP joints) present.     Cervical back: Neck supple. No tenderness.     Right lower leg: No edema.     Left lower leg: No edema.  Skin:    General: Skin is warm.     Findings: No rash.  Neurological:     General: No focal deficit present.     Mental Status: He is alert and oriented to person, place, and time.     Cranial Nerves: No cranial nerve deficit.     Sensory: No sensory deficit.     Motor: No weakness.  Psychiatric:        Mood and Affect: Mood normal.        Behavior: Behavior normal.     BP 134/84 (BP Location: Left Arm, Patient Position: Sitting, Cuff Size: Large)   Pulse 68   Ht 5\' 7"  (1.702 m)   Wt 239 lb 3.2 oz (108.5 kg)   SpO2 98%   BMI 37.46 kg/m  Wt Readings from Last 3 Encounters:  04/21/22 239 lb 3.2 oz (108.5 kg)  02/09/22 238 lb 3.2 oz (108 kg)  01/24/22 234 lb 12.8 oz  (106.5 kg)    Lab Results  Component Value Date   TSH 3.364 02/10/2020   Lab Results  Component Value Date   WBC 7.6 02/09/2022   HGB 14.2 02/09/2022   HCT 40.7 02/09/2022   MCV 90.0 02/09/2022   PLT  238 02/09/2022   Lab Results  Component Value Date   NA 137 03/15/2022   K 3.7 03/15/2022   CO2 26 03/15/2022   GLUCOSE 99 03/15/2022   BUN 8 03/15/2022   CREATININE 1.16 03/15/2022   BILITOT 0.5 04/07/2021   ALKPHOS 69 04/07/2021   AST 24 04/07/2021   ALT 23 04/07/2021   PROT 7.4 04/07/2021   ALBUMIN 4.7 04/07/2021   CALCIUM 9.2 03/15/2022   ANIONGAP 11 03/15/2022   EGFR 85 11/02/2021   Lab Results  Component Value Date   CHOL 163 11/02/2021   Lab Results  Component Value Date   HDL 45 11/02/2021   Lab Results  Component Value Date   LDLCALC 91 11/02/2021   Lab Results  Component Value Date   TRIG 153 (H) 11/02/2021   Lab Results  Component Value Date   CHOLHDL 3.6 11/02/2021   Lab Results  Component Value Date   HGBA1C 5.5 04/07/2021      Assessment & Plan:   Problem List Items Addressed This Visit       Cardiovascular and Mediastinum   Chronic systolic heart failure    Followed by CHF clinic On Entresto, Coreg, Jardiance, spironolactone and as needed Lasix Appears to be euvolemic currently Last echo showed LVEF 30-35%      Relevant Orders   TSH   CMP14+EGFR   CBC with Differential/Platelet   Essential hypertension    BP Readings from Last 1 Encounters:  04/21/22 134/84  Well-controlled Counseled for compliance with the medications Advised DASH diet and moderate exercise/walking as tolerated        Endocrine   IFG (impaired fasting glucose)   Relevant Orders   Hemoglobin A1c     Musculoskeletal and Integument   Primary osteoarthritis of both hands    Likely due to overuse injury Advised to use topical Voltaren gel Advised to avoid oral NSAIDs        Other   Encounter for general adult medical examination with abnormal  findings - Primary    Physical exam as documented. Fasting blood tests ordered. PCV20 vaccine today.       Mixed hyperlipidemia    Needs to take Crestor regularly, refilled      Relevant Orders   Lipid panel   Other Visit Diagnoses     Prostate cancer screening       Relevant Orders   PSA   Vitamin D deficiency       Relevant Orders   VITAMIN D 25 Hydroxy (Vit-D Deficiency, Fractures)       No orders of the defined types were placed in this encounter.   Follow-up: Return in about 6 months (around 10/21/2022).    Anabel Halon, MD

## 2022-04-22 LAB — CBC WITH DIFFERENTIAL/PLATELET
Basophils Absolute: 0 10*3/uL (ref 0.0–0.2)
Basos: 0 %
EOS (ABSOLUTE): 0.1 10*3/uL (ref 0.0–0.4)
Eos: 1 %
Hematocrit: 41.6 % (ref 37.5–51.0)
Hemoglobin: 14.2 g/dL (ref 13.0–17.7)
Immature Grans (Abs): 0 10*3/uL (ref 0.0–0.1)
Immature Granulocytes: 0 %
Lymphocytes Absolute: 1.9 10*3/uL (ref 0.7–3.1)
Lymphs: 25 %
MCH: 31.6 pg (ref 26.6–33.0)
MCHC: 34.1 g/dL (ref 31.5–35.7)
MCV: 92 fL (ref 79–97)
Monocytes Absolute: 0.8 10*3/uL (ref 0.1–0.9)
Monocytes: 10 %
Neutrophils Absolute: 4.9 10*3/uL (ref 1.4–7.0)
Neutrophils: 64 %
Platelets: 214 10*3/uL (ref 150–450)
RBC: 4.5 x10E6/uL (ref 4.14–5.80)
RDW: 13 % (ref 11.6–15.4)
WBC: 7.7 10*3/uL (ref 3.4–10.8)

## 2022-04-22 LAB — CMP14+EGFR
ALT: 24 IU/L (ref 0–44)
AST: 23 IU/L (ref 0–40)
Albumin/Globulin Ratio: 1.6 (ref 1.2–2.2)
Albumin: 4.5 g/dL (ref 3.8–4.9)
Alkaline Phosphatase: 64 IU/L (ref 44–121)
BUN/Creatinine Ratio: 6 — ABNORMAL LOW (ref 9–20)
BUN: 7 mg/dL (ref 6–24)
Bilirubin Total: 0.3 mg/dL (ref 0.0–1.2)
CO2: 23 mmol/L (ref 20–29)
Calcium: 9.4 mg/dL (ref 8.7–10.2)
Chloride: 101 mmol/L (ref 96–106)
Creatinine, Ser: 1.11 mg/dL (ref 0.76–1.27)
Globulin, Total: 2.8 g/dL (ref 1.5–4.5)
Glucose: 97 mg/dL (ref 70–99)
Potassium: 3.9 mmol/L (ref 3.5–5.2)
Sodium: 138 mmol/L (ref 134–144)
Total Protein: 7.3 g/dL (ref 6.0–8.5)
eGFR: 79 mL/min/{1.73_m2} (ref >59)

## 2022-04-22 LAB — VITAMIN D 25 HYDROXY (VIT D DEFICIENCY, FRACTURES): Vit D, 25-Hydroxy: 13.5 ng/mL — ABNORMAL LOW (ref 30.0–100.0)

## 2022-04-22 LAB — LIPID PANEL
Chol/HDL Ratio: 3.3 ratio (ref 0.0–5.0)
Cholesterol, Total: 137 mg/dL (ref 100–199)
HDL: 41 mg/dL (ref >39)
LDL Chol Calc (NIH): 72 mg/dL (ref 0–99)
Triglycerides: 136 mg/dL (ref 0–149)
VLDL Cholesterol Cal: 24 mg/dL (ref 5–40)

## 2022-04-22 LAB — TSH: TSH: 2.56 u[IU]/mL (ref 0.450–4.500)

## 2022-04-22 LAB — PSA: Prostate Specific Ag, Serum: 0.3 ng/mL (ref 0.0–4.0)

## 2022-04-22 LAB — HEMOGLOBIN A1C
Est. average glucose Bld gHb Est-mCnc: 117 mg/dL
Hgb A1c MFr Bld: 5.7 % — ABNORMAL HIGH (ref 4.8–5.6)

## 2022-04-26 ENCOUNTER — Other Ambulatory Visit: Payer: Self-pay | Admitting: Internal Medicine

## 2022-04-26 DIAGNOSIS — E782 Mixed hyperlipidemia: Secondary | ICD-10-CM

## 2022-05-08 NOTE — Progress Notes (Signed)
[facility] Note   PCP: Bjorn Pippin, NP Primary Cardilogist: Dr. Diona Browner HF Cardiologist: Dr. Gala Romney  HPI: Mr Craig Hanson is a 52 y.o.male with a hx of EtOH abuse, chronic systolic HF, and pericardial effusion.    Admitted 02/06/20 with acute heart failure. R/LHC which showed minimal nonobstructive CAD, severe NICM EF <20% and markedly elevated filling pressures w/ preserved CO.1/29, co-ox dropped to 43% and milrinone added. Diuresed ~60 lb. cMRI showed severe biventricular dysfunction c/w NICM. Discharge weight 283 lbs.  cMRI (8/23) LVEF 31%, RVEF 42%  Follow up 1/24, patient wanted to hold off on ICD. Plan to push GDMT and repeat echo x 6 months.  Today he returns for HF follow up. Overall feeling fine. He is not short of breath with activity. Denies palpitations, CP, dizziness, edema, or PND/Orthopnea. Appetite ok. No fever or chills. Weight at home 233 pounds. Taking all medications. Stopped drinking beer, now drinks 1-2 shots on the weekend. Has not needed lasix. Not currently employed.   Cardiac Studies: cMRI (8/23): LVEF 31%, RVEF 42%, no definitive evidence for prior MI, myocarditis, or infiltrative disease.  Echo (3/23): EF 30-35% small effusion   cMRI (2/22): severe biventricular dysfunction and NICM. Moderate to large effusion. No tamponade.  R/LHC (02/13/20): Ost LAD to Prox LAD lesion is 20% stenosed, Ost Cx to Prox Cx lesion is 20% stenosed.  Ao = 110/84 (95) LV = 106/33 RA =  20 RV = 59/29 PA = 62/20 (43) PCW = 38 Fick cardiac output/index = 5.1/2.3 SVR = 1186 PVR = 1.0 WU FA sat = 96% PA sat = 64%, 68% SVC sat = 66% PAPi = 2.1   Assessment: 1. Minimal nonobstructive CAD 2. 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 Markedly elevated filling pressures with preserved cardiac output   Echo (02/07/20): EF 20-25%, global hypokinesis, grade III dd, severe RV dysfunction, mod LAE, moderate pericardial effusion  ROS: All systems negative except as listed in HPI,  PMH and Problem List.  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  Tobacco Use   Smoking status: Never   Smokeless tobacco: Never  Vaping Use   Vaping Use: Never used  Substance and Sexual Activity   Alcohol use: Not Currently    Alcohol/week: 14.0 - 21.0 standard drinks of alcohol    Types: 14 - 21 Cans of beer per week    Comment: 2-3 shots of liquor a few times a week   Drug use: Never   Sexual activity: Yes  Other Topics Concern   Not on file  Social History Narrative   Not on file   Social Determinants of Health   Financial Resource Strain: Not on file  Food Insecurity: Not on file  Transportation Needs: Not on file  Physical Activity: Not on file  Stress: Not on file  Social Connections: Not on file  Intimate Partner Violence: Not on file   FH:  Family History  Problem Relation Age of Onset   Hypertension Mother    Heart disease Father    Stroke Father    Colon cancer Neg Hx    Colonic polyp Neg Hx     Past Medical History:  Diagnosis Date   Acute CHF (  congestive heart failure) 02/06/2020   Asthma    GERD (gastroesophageal reflux disease)    Pericardial effusion    Systolic HF (heart failure)     Current Outpatient Medications  Medication Sig Dispense Refill   atorvastatin (LIPITOR) 80 MG tablet Take 1 tablet (80 mg total) by mouth daily. 90 tablet 3   carvedilol (COREG) 25 MG tablet Take 1 tablet (25 mg total) by mouth 2 (two) times daily. 60 tablet 11   empagliflozin (JARDIANCE) 10 MG TABS tablet Take 1 tablet (10 mg total) by mouth daily before breakfast. 30 tablet 11   furosemide (LASIX) 40 MG tablet Take 1 tablet (40 mg total) by mouth as needed. For weight gain of 3lb in 24 hours or 5lb in a week 90 tablet 1    sacubitril-valsartan (ENTRESTO) 97-103 MG Take 1 tablet by mouth 2 (two) times daily. 60 tablet 11   spironolactone (ALDACTONE) 25 MG tablet Take 1 tablet (25 mg total) by mouth daily. 90 tablet 3   No current facility-administered medications for this encounter.   BP 132/82   Pulse 61   Wt 108 kg (238 lb)   SpO2 99%   BMI 37.28 kg/m   Wt Readings from Last 3 Encounters:  05/10/22 108 kg (238 lb)  04/21/22 108.5 kg (239 lb 3.2 oz)  02/09/22 108 kg (238 lb 3.2 oz)   PHYSICAL EXAM: General:  NAD. No resp difficulty, walked into clinic HEENT: Normal Neck: Supple. No JVD. Carotids 2+ bilat; no bruits. No lymphadenopathy or thryomegaly appreciated. Cor: PMI nondisplaced. Regular rate & rhythm. No rubs, gallops or murmurs. Lungs: Clear Abdomen: Soft, nontender, nondistended. No hepatosplenomegaly. No bruits or masses. Good bowel sounds. Extremities: No cyanosis, clubbing, rash, edema Neuro: Alert & oriented x 3, cranial nerves grossly intact. Moves all 4 extremities w/o difficulty. Affect pleasant.    ASSESSMENT & PLAN: 1. Chronic systolic HF with severe biventricular dysfunction due to NICM - Echo (1/22):EF 20% severe RV dysfunction moderate pericardial effusion, suspect ETOH related stopped drinking 3 months ago.  - Cath (02/13/20): with minimal CAD and massively volume overload. - cMRI (2/22): LVEF 17% RV Severe dysfunction, ? Acute myocarditis possible sarcoid. Wall thinning.   - Echo (07/09/20): EF 25-30% - Echo (3/23): EF 30-35% small effusion  - cMRI (8/23): LVEF 31%, RVEF 42%. No definitive evidence for prior MI, myocarditis, or infiltrative disease - NYHA I-II. Volume looks good today. - Add hydral 12.5 mg tid + Imdur 15 mg daily for afterload reduction. Discussed potential side effects. - Continue Lasix 40 PRN.  - Continue Coreg 25 mg bid. - Continue Entresto 97/103 bid - Continue spironolactone 25 mg daily - Continue Jardiance 10 mg daily. - Check labs - Repeat echo in  3 months, refer to EP for ICD if EF < 35%.   2. Pericardial effusion. - Small on echo 3/23 - Small to moderate on cMRI 8/23   3. Int chest pain - LHC 2022 minimal CAD - Resolved. No CP - Adding long acting nitrate as above.  4. HTN  - BP better but not at goal  - Add hydral/Imdur as above  5. ETOH abuse - Cut back, congratulations - Discussed on-going moderation.  Follow up in 3 months with Dr. Gala Romney + echo.  Craig Rome, FNP-BC 05/10/22

## 2022-05-10 ENCOUNTER — Encounter (HOSPITAL_COMMUNITY): Payer: Self-pay

## 2022-05-10 ENCOUNTER — Ambulatory Visit (HOSPITAL_COMMUNITY)
Admission: RE | Admit: 2022-05-10 | Discharge: 2022-05-10 | Disposition: A | Discharge status: Home / Self Care | Payer: Self-pay | Source: Ambulatory Visit | Attending: Family Medicine | Admitting: Family Medicine

## 2022-05-10 VITALS — BP 132/82 | HR 61 | Wt 238.0 lb

## 2022-05-10 DIAGNOSIS — I110 Hypertensive heart disease with heart failure: Secondary | ICD-10-CM | POA: Insufficient documentation

## 2022-05-10 DIAGNOSIS — R079 Chest pain, unspecified: Secondary | ICD-10-CM | POA: Insufficient documentation

## 2022-05-10 DIAGNOSIS — I3139 Other pericardial effusion (noninflammatory): Secondary | ICD-10-CM | POA: Insufficient documentation

## 2022-05-10 DIAGNOSIS — I2510 Atherosclerotic heart disease of native coronary artery without angina pectoris: Secondary | ICD-10-CM | POA: Insufficient documentation

## 2022-05-10 DIAGNOSIS — I10 Essential (primary) hypertension: Secondary | ICD-10-CM

## 2022-05-10 DIAGNOSIS — F1010 Alcohol abuse, uncomplicated: Secondary | ICD-10-CM | POA: Insufficient documentation

## 2022-05-10 DIAGNOSIS — I5022 Chronic systolic (congestive) heart failure: Secondary | ICD-10-CM | POA: Insufficient documentation

## 2022-05-10 DIAGNOSIS — I428 Other cardiomyopathies: Secondary | ICD-10-CM | POA: Insufficient documentation

## 2022-05-10 DIAGNOSIS — Z79899 Other long term (current) drug therapy: Secondary | ICD-10-CM | POA: Insufficient documentation

## 2022-05-10 MED ORDER — ISOSORBIDE MONONITRATE ER 30 MG PO TB24: 15.0000 mg | ORAL_TABLET | Freq: Every day | ORAL | 3 refills | Status: DC

## 2022-05-10 MED ORDER — HYDRALAZINE HCL 25 MG PO TABS: 12.5000 mg | ORAL_TABLET | Freq: Three times a day (TID) | ORAL | 3 refills | Status: DC

## 2022-05-10 NOTE — Patient Instructions (Signed)
START Hydralazine 12.5 mg (one half tab) three times daily START Isosorbide 15 mg (one half tab) daily     Your physician recommends that you schedule a follow-up appointment in: 3 months with Dr Gala Romney and echo  Your physician has requested that you have an echocardiogram. Echocardiography is a painless test that uses sound waves to create images of your heart. It provides your doctor with information about the size and shape of your heart and how well your heart's chambers and valves are working. This procedure takes approximately one hour. There are no restrictions for this procedure. Please do NOT wear cologne, perfume, aftershave, or lotions (deodorant is allowed). Please arrive 15 minutes prior to your appointment time.   Do the following things EVERYDAY: Weigh yourself in the morning before breakfast. Write it down and keep it in a log. Take your medicines as prescribed Eat low salt foods--Limit salt (sodium) to 2000 mg per day.  Stay as active as you can everyday Limit all fluids for the day to less than 2 liters  At the [facility], you and your health needs are our priority. As part of our continuing mission to provide you with exceptional heart care, we have created designated Provider Care Teams. These Care Teams include your primary Cardiologist (physician) and Advanced Practice Providers (APPs- Physician Assistants and Nurse Practitioners) who all work together to provide you with the care you need, when you need it.   You may see any of the following providers on your designated Care Team at your next follow up: Dr Arvilla Meres Dr Marca Ancona Dr. Marcos Eke, NP Robbie Lis, [place] [facility] [place], [place] Brynda Peon, NP Karle Plumber, PharmD   Please be sure to bring in all your medications bottles to every appointment.    Thank you for choosing [facility]

## 2022-05-21 ENCOUNTER — Other Ambulatory Visit: Payer: Self-pay | Admitting: Internal Medicine

## 2022-07-28 ENCOUNTER — Other Ambulatory Visit (HOSPITAL_COMMUNITY): Payer: Self-pay | Admitting: Cardiology

## 2022-07-28 MED ORDER — ENTRESTO 97-103 MG PO TABS: 1.0000 | ORAL_TABLET | Freq: Two times a day (BID) | ORAL | 11 refills | Status: DC

## 2022-08-16 ENCOUNTER — Other Ambulatory Visit (HOSPITAL_COMMUNITY): Payer: Self-pay | Admitting: Family Medicine

## 2022-10-27 ENCOUNTER — Ambulatory Visit: Payer: 59 | Admitting: Internal Medicine

## 2022-10-30 ENCOUNTER — Encounter: Payer: Self-pay | Admitting: Internal Medicine

## 2022-10-30 ENCOUNTER — Ambulatory Visit: Payer: Medicare PPO | Admitting: Internal Medicine

## 2022-10-30 VITALS — BP 144/88 | HR 72 | Ht 67.0 in | Wt 231.4 lb

## 2022-10-30 DIAGNOSIS — I10 Essential (primary) hypertension: Secondary | ICD-10-CM | POA: Diagnosis not present

## 2022-10-30 DIAGNOSIS — Z23 Encounter for immunization: Secondary | ICD-10-CM | POA: Diagnosis not present

## 2022-10-30 DIAGNOSIS — E782 Mixed hyperlipidemia: Secondary | ICD-10-CM | POA: Diagnosis not present

## 2022-10-30 DIAGNOSIS — I5022 Chronic systolic (congestive) heart failure: Secondary | ICD-10-CM

## 2022-10-30 NOTE — Assessment & Plan Note (Signed)
On Lipitor 80 mg QD

## 2022-10-30 NOTE — Assessment & Plan Note (Signed)
BMI Readings from Last 1 Encounters:  10/30/22 36.24 kg/m   Associated with HFrEF, OA and HLD Advised to follow low-carb diet and perform moderate exercise at least 150 minutes/week

## 2022-10-30 NOTE — Assessment & Plan Note (Addendum)
BP Readings from Last 1 Encounters:  10/30/22 (!) 144/88   Uncontrolled with Coreg 25 mg twice daily, Entresto 97-103 mg once daily and spironolactone 25 mg once daily Had headache with Imdur Dizziness could be due to hypotensive spells from Imdur and hydralazine together Advised to take hydralazine half tablet twice daily Counseled for compliance with the medications Advised DASH diet and moderate exercise/walking as tolerated

## 2022-10-30 NOTE — Assessment & Plan Note (Addendum)
Followed by CHF clinic On Entresto, Coreg, Jardiance, spironolactone and as needed Lasix Did not tolerate Imdur and Hydralazine together Appears to be euvolemic currently Last echo showed LVEF 30-35%

## 2022-10-30 NOTE — Progress Notes (Signed)
Established Patient Office Visit  Subjective:  Patient ID: Craig Hanson, male    DOB: 1970/01/31  Age: 52 y.o. MRN: [id number]  CC:  Chief Complaint  Patient presents with   Hypertension    Six month follow up     HPI Craig Hanson is a 52 y.o. male with past medical history of HFrEF who presents for f/u of his chronic medical conditions.  He has been doing well overall. His BP is elevated today.  He is currently taking Coreg 25 mg twice daily, Entresto 97-103 mg BID and spironolactone 25 mg QD.  He was placed on hydralazine 12.5 mg 3 times daily and Imdur 15 mg QD by  CHF clinic.  He had headache from Imdur and was also having dizziness, and stopped taking them after a month.  Denies any recent worsening of leg swelling or dyspnea. Denies any chest pain, dizziness or palpitations currently. Followed by CHF clinic.   He has lost 8 lbs since the last visit. He has stopped taking alcoholic beverages.    Past Medical History:  Diagnosis Date   Acute CHF (congestive heart failure) (HCC) 02/06/2020   Asthma    GERD (gastroesophageal reflux disease)    Pericardial effusion    Systolic HF (heart failure) (HCC)     Past Surgical History:  Procedure Laterality Date   COLONOSCOPY WITH PROPOFOL N/A 10/31/2021   Procedure: COLONOSCOPY WITH PROPOFOL;  Surgeon: Lanelle Bal, DO;  Location: [facility];  Service: Endoscopy;  Laterality: N/A;  8:30am, asa 3   No prior surgery     RIGHT/LEFT HEART CATH AND CORONARY ANGIOGRAPHY N/A 02/13/2020   Procedure: RIGHT/LEFT HEART CATH AND CORONARY ANGIOGRAPHY;  Surgeon: Dolores Patty, MD;  Location: [facility];  Service: Cardiovascular;  Laterality: N/A;    Family History  Problem Relation Age of Onset   Hypertension Mother    Heart disease Father    Stroke Father    Colon cancer Neg Hx    Colonic polyp Neg Hx     Social History   Socioeconomic History   Marital status: Married    Spouse name: Not on file    Number of children: Not on file   Years of education: Not on file   Highest education level: Not on file  Occupational History    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  Tobacco Use   Smoking status: Never   Smokeless tobacco: Never  Vaping Use   Vaping status: Never Used  Substance and Sexual Activity   Alcohol use: Not Currently    Alcohol/week: 14.0 - 21.0 standard drinks of alcohol    Types: 14 - 21 Cans of beer per week    Comment: 2-3 shots of liquor a few times a week   Drug use: Never   Sexual activity: Yes  Other Topics Concern   Not on file  Social History Narrative   Not on file   Social Determinants of Health   Financial Resource Strain: Not on file  Food Insecurity: Not on file  Transportation Needs: Not on file  Physical Activity: Not on file  Stress: Not on file  Social Connections: Not on file  Intimate Partner Violence: Not on file    Outpatient Medications Prior to Visit  Medication Sig Dispense Refill   atorvastatin (LIPITOR) 80 MG tablet Take 1 tablet (80 mg total) by mouth daily. 90 tablet 3   carvedilol (COREG) 25 MG tablet Take 1 tablet (25 mg  total) by mouth 2 (two) times daily. 60 tablet 11   empagliflozin (JARDIANCE) 10 MG TABS tablet Take 1 tablet (10 mg total) by mouth daily before breakfast. 30 tablet 11   furosemide (LASIX) 40 MG tablet Take 1 tablet (40 mg total) by mouth as needed. For weight gain of 3lb in 24 hours or 5lb in a week 90 tablet 1   hydrALAZINE (APRESOLINE) 25 MG tablet TAKE 1/2 OF A TABLET (12.5 MG TOTAL) BY MOUTH 3 TIMES A DAY 45 tablet 0   isosorbide mononitrate (IMDUR) 30 MG 24 hr tablet Take 0.5 tablets (15 mg total) by mouth daily. 45 tablet 3   sacubitril-valsartan (ENTRESTO) 97-103 MG Take 1 tablet by mouth 2 (two) times daily. 60 tablet 11   spironolactone (ALDACTONE) 25 MG tablet TAKE 1 TABLET (25 MG TOTAL) BY MOUTH DAILY. 90 tablet 3   No facility-administered medications prior to visit.    No Known  Allergies  ROS Review of Systems  Constitutional:  Negative for chills and fever.  HENT:  Negative for congestion and sore throat.   Eyes:  Negative for pain and discharge.  Respiratory:  Negative for cough and shortness of breath.   Cardiovascular:  Negative for chest pain and palpitations.  Gastrointestinal:  Negative for constipation, diarrhea, nausea and vomiting.  Endocrine: Negative for polydipsia and polyuria.  Genitourinary:  Negative for dysuria and hematuria.  Musculoskeletal:  Positive for arthralgias (B/l wrist and hand joints with swelling). Negative for neck pain and neck stiffness.  Skin:  Negative for rash.  Neurological:  Negative for dizziness, weakness, numbness and headaches.  Psychiatric/Behavioral:  Negative for agitation and behavioral problems.       Objective:    Physical Exam Vitals reviewed.  Constitutional:      General: He is not in acute distress.    Appearance: He is not diaphoretic.  HENT:     Head: Normocephalic and atraumatic.     Nose: Nose normal.     Mouth/Throat:     Mouth: Mucous membranes are moist.  Eyes:     General: No scleral icterus.    Extraocular Movements: Extraocular movements intact.  Cardiovascular:     Rate and Rhythm: Normal rate and regular rhythm.     Heart sounds: No murmur heard. Pulmonary:     Breath sounds: Normal breath sounds. No wheezing or rales.  Musculoskeletal:     Right hand: Swelling (PIP and DIP joints) present.     Left hand: Swelling (PIP and DIP joints) present.     Cervical back: Neck supple. No tenderness.     Right lower leg: No edema.     Left lower leg: No edema.  Skin:    General: Skin is warm.     Findings: No rash.  Neurological:     General: No focal deficit present.     Mental Status: He is alert and oriented to person, place, and time.     Cranial Nerves: No cranial nerve deficit.     Sensory: No sensory deficit.     Motor: No weakness.  Psychiatric:        Mood and Affect: Mood  normal.        Behavior: Behavior normal.     BP (!) 144/88 (BP Location: Right Arm)   Pulse 72   Ht 5\' 7"  (1.702 m)   Wt 231 lb 6.4 oz (105 kg)   SpO2 98%   BMI 36.24 kg/m  Wt Readings from Last 3 Encounters:  10/30/22 231 lb 6.4 oz (105 kg)  05/10/22 238 lb (108 kg)  04/21/22 239 lb 3.2 oz (108.5 kg)    Lab Results  Component Value Date   TSH 2.560 04/21/2022   Lab Results  Component Value Date   WBC 7.7 04/21/2022   HGB 14.2 04/21/2022   HCT 41.6 04/21/2022   MCV 92 04/21/2022   PLT 214 04/21/2022   Lab Results  Component Value Date   NA 138 04/21/2022   K 3.9 04/21/2022   CO2 23 04/21/2022   GLUCOSE 97 04/21/2022   BUN 7 04/21/2022   CREATININE 1.11 04/21/2022   BILITOT 0.3 04/21/2022   ALKPHOS 64 04/21/2022   AST 23 04/21/2022   ALT 24 04/21/2022   PROT 7.3 04/21/2022   ALBUMIN 4.5 04/21/2022   CALCIUM 9.4 04/21/2022   ANIONGAP 11 03/15/2022   EGFR 79 04/21/2022   Lab Results  Component Value Date   CHOL 137 04/21/2022   Lab Results  Component Value Date   HDL 41 04/21/2022   Lab Results  Component Value Date   LDLCALC 72 04/21/2022   Lab Results  Component Value Date   TRIG 136 04/21/2022   Lab Results  Component Value Date   CHOLHDL 3.3 04/21/2022   Lab Results  Component Value Date   HGBA1C 5.7 (H) 04/21/2022      Assessment & Plan:   Problem List Items Addressed This Visit       Cardiovascular and Mediastinum   Chronic systolic heart failure (HCC) - Primary    Followed by CHF clinic On Entresto, Coreg, Jardiance, spironolactone and as needed Lasix Did not tolerate Imdur and Hydralazine together Appears to be euvolemic currently Last echo showed LVEF 30-35%      Relevant Orders   Basic Metabolic Panel (BMET)   B Nat Peptide   Essential hypertension    BP Readings from Last 1 Encounters:  10/30/22 (!) 144/88   Uncontrolled with Coreg 25 mg twice daily, Entresto 97-103 mg once daily and spironolactone 25 mg once  daily Had headache with Imdur Dizziness could be due to hypotensive spells from Imdur and hydralazine together Advised to take hydralazine half tablet twice daily Counseled for compliance with the medications Advised DASH diet and moderate exercise/walking as tolerated      Relevant Orders   Basic Metabolic Panel (BMET)     Other   Morbid obesity (HCC)    BMI Readings from Last 1 Encounters:  10/30/22 36.24 kg/m   Associated with HFrEF, OA and HLD Advised to follow low-carb diet and perform moderate exercise at least 150 minutes/week      Mixed hyperlipidemia    On Lipitor 80 mg QD      Other Visit Diagnoses     Encounter for immunization       Relevant Orders   Flu vaccine trivalent PF, 6mos and older(Flulaval,Afluria,Fluarix,Fluzone) (Completed)        No orders of the defined types were placed in this encounter.   Follow-up: Return in about 6 months (around 04/30/2023) for Annual physical.    Anabel Halon, MD

## 2022-10-30 NOTE — Patient Instructions (Signed)
Please start taking Hydralazine half tablet twice daily.  Please continue to take medications as prescribed.  Please continue to follow low salt diet and perform moderate exercise/walking at least 150 mins/week.

## 2022-11-01 LAB — BASIC METABOLIC PANEL
BUN/Creatinine Ratio: 7 — ABNORMAL LOW (ref 9–20)
BUN: 8 mg/dL (ref 6–24)
CO2: 23 mmol/L (ref 20–29)
Calcium: 9.3 mg/dL (ref 8.7–10.2)
Chloride: 102 mmol/L (ref 96–106)
Creatinine, Ser: 1.11 mg/dL (ref 0.76–1.27)
Glucose: 105 mg/dL — ABNORMAL HIGH (ref 70–99)
Potassium: 4 mmol/L (ref 3.5–5.2)
Sodium: 140 mmol/L (ref 134–144)
eGFR: 79 mL/min/{1.73_m2} (ref >59)

## 2022-11-01 LAB — BRAIN NATRIURETIC PEPTIDE: BNP: 14.6 pg/mL (ref 0.0–100.0)

## 2022-12-08 ENCOUNTER — Telehealth: Payer: Self-pay | Admitting: *Deleted

## 2022-12-11 ENCOUNTER — Ambulatory Visit (HOSPITAL_BASED_OUTPATIENT_CLINIC_OR_DEPARTMENT_OTHER)
Admission: RE | Admit: 2022-12-11 | Discharge: 2022-12-11 | Disposition: A | Discharge status: Home / Self Care | Payer: Medicare PPO | Source: Ambulatory Visit | Attending: Internal Medicine | Admitting: Internal Medicine

## 2022-12-11 ENCOUNTER — Ambulatory Visit (HOSPITAL_COMMUNITY)
Admission: RE | Admit: 2022-12-11 | Discharge: 2022-12-11 | Disposition: A | Discharge status: Home / Self Care | Payer: Medicare PPO | Source: Ambulatory Visit | Attending: Internal Medicine | Admitting: Internal Medicine

## 2022-12-11 ENCOUNTER — Encounter (HOSPITAL_COMMUNITY): Payer: Self-pay | Admitting: Internal Medicine

## 2022-12-11 VITALS — BP 140/88 | HR 70 | Wt 233.4 lb

## 2022-12-11 DIAGNOSIS — I081 Rheumatic disorders of both mitral and tricuspid valves: Secondary | ICD-10-CM | POA: Diagnosis not present

## 2022-12-11 DIAGNOSIS — I10 Essential (primary) hypertension: Secondary | ICD-10-CM | POA: Diagnosis not present

## 2022-12-11 DIAGNOSIS — I5022 Chronic systolic (congestive) heart failure: Secondary | ICD-10-CM

## 2022-12-11 DIAGNOSIS — I428 Other cardiomyopathies: Secondary | ICD-10-CM | POA: Diagnosis not present

## 2022-12-11 DIAGNOSIS — E785 Hyperlipidemia, unspecified: Secondary | ICD-10-CM | POA: Diagnosis not present

## 2022-12-11 DIAGNOSIS — I110 Hypertensive heart disease with heart failure: Secondary | ICD-10-CM | POA: Insufficient documentation

## 2022-12-11 DIAGNOSIS — I3139 Other pericardial effusion (noninflammatory): Secondary | ICD-10-CM | POA: Insufficient documentation

## 2022-12-11 DIAGNOSIS — F1010 Alcohol abuse, uncomplicated: Secondary | ICD-10-CM

## 2022-12-11 DIAGNOSIS — Z006 Encounter for examination for normal comparison and control in clinical research program: Secondary | ICD-10-CM

## 2022-12-11 LAB — ECHOCARDIOGRAM COMPLETE
AR max vel: 3.2 cm2
AV Area VTI: 3.08 cm2
AV Area mean vel: 3.13 cm2
AV Mean grad: 3.5 mm[Hg]
AV Peak grad: 7.6 mm[Hg]
Ao pk vel: 1.38 m/s
Area-P 1/2: 3.1 cm2
Calc EF: 40.4 %
S' Lateral: 5.1 cm
Single Plane A2C EF: 48.8 %
Single Plane A4C EF: 31.3 %

## 2022-12-11 MED ORDER — HYDRALAZINE HCL 25 MG PO TABS: 25.0000 mg | ORAL_TABLET | Freq: Three times a day (TID) | ORAL | 6 refills | Status: DC

## 2022-12-11 NOTE — Patient Instructions (Signed)
Medication Changes:  Increase Hydralazine to 25 mg Three times a day   Special Instructions // Education:  Do the following things EVERYDAY: Weigh yourself in the morning before breakfast. Write it down and keep it in a log. Take your medicines as prescribed Eat low salt foods--Limit salt (sodium) to 2000 mg per day.  Stay as active as you can everyday Limit all fluids for the day to less than 2 liters   Follow-Up in: 3-4 months   At the [facility], you and your health needs are our priority. We have a designated team specialized in the treatment of Heart Failure. This Care Team includes your primary Heart Failure Specialized Cardiologist (physician), Advanced Practice Providers (APPs- Physician Assistants and Nurse Practitioners), and Pharmacist who all work together to provide you with the care you need, when you need it.   You may see any of the following providers on your designated Care Team at your next follow up:  Dr. Arvilla Meres Dr. Marca Ancona Dr. Dorthula Nettles Dr. Theresia Bough Tonye Becket, NP Robbie Lis, [place] [facility] [place], [place] Brynda Peon, NP [place] Lee, NP Karle Plumber, PharmD   Please be sure to bring in all your medications bottles to every appointment.   Need to Contact us:  If you have any questions or concerns before your next appointment please send [place] a message through [place] or call our office at [phone].    TO LEAVE A MESSAGE FOR THE NURSE SELECT OPTION 2, PLEASE LEAVE A MESSAGE INCLUDING: YOUR NAME DATE OF BIRTH CALL BACK NUMBER REASON FOR CALL**this is important as we prioritize the call backs  YOU WILL RECEIVE A CALL BACK THE SAME DAY AS LONG AS YOU CALL BEFORE 4:00 PM

## 2022-12-11 NOTE — Progress Notes (Signed)
[facility] Note   PCP: Bjorn Pippin, NP Primary Cardilogist: Dr. Diona Browner HF Cardiologist: Dr. Gala Romney  HPI: Mr Craig Hanson is a 52 y.o.male with a hx of EtOH abuse, chronic systolic HF, and pericardial effusion.    Admitted 02/06/20 with acute heart failure. R/LHC which showed minimal nonobstructive CAD, severe NICM EF <20% and markedly elevated filling pressures w/ preserved CO.1/29, co-ox dropped to 43% and milrinone added. Diuresed ~60 lb. cMRI showed severe biventricular dysfunction c/w NICM. Discharge weight 283 lbs.  cMRI (8/23) LVEF 31%, RVEF 42%  Follow up 1/24, patient wanted to hold off on ICD. Plan to push GDMT and repeat echo x 6 months.  Today he returns for HF follow up. Feels good. Walking regularly and doing yardwork. No CP or SOB. No edema. Complaint with meds except for isosorbide due to HAs. Still drinking a little  Echo today 12/11/22  EF 35-40% with septal dyssynchrony Personally reviewed   Cardiac Studies: cMRI (8/23): LVEF 31%, RVEF 42%, no definitive evidence for prior MI, myocarditis, or infiltrative disease.  Echo (3/23): EF 30-35% small effusion   cMRI (2/22): severe biventricular dysfunction and NICM. Moderate to large effusion. No tamponade.  R/LHC (02/13/20): Ost LAD to Prox LAD lesion is 20% stenosed, Ost Cx to Prox Cx lesion is 20% stenosed.  Ao = 110/84 (95) LV = 106/33 RA =  20 RV = 59/29 PA = 62/20 (43) PCW = 38 Fick cardiac output/index = 5.1/2.3 SVR = 1186 PVR = 1.0 WU FA sat = 96% PA sat = 64%, 68% SVC sat = 66% PAPi = 2.1   Assessment: 1. Minimal nonobstructive CAD 2. 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 Markedly elevated filling pressures with preserved cardiac output   Echo (02/07/20): EF 20-25%, global hypokinesis, grade III dd, severe RV dysfunction, mod LAE, moderate pericardial effusion  ROS: All systems negative except as listed in HPI, PMH and Problem List.  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  Tobacco Use   Smoking status: Never   Smokeless tobacco: Never  Vaping Use   Vaping status: Never Used  Substance and Sexual Activity   Alcohol use: Not Currently    Alcohol/week: 14.0 - 21.0 standard drinks of alcohol    Types: 14 - 21 Cans of beer per week    Comment: 2-3 shots of liquor a few times a week   Drug use: Never   Sexual activity: Yes  Other Topics Concern   Not on file  Social History Narrative   Not on file   Social Determinants of Health   Financial Resource Strain: Not on file  Food Insecurity: Not on file  Transportation Needs: Not on file  Physical Activity: Not on file  Stress: Not on file  Social Connections: Not on file  Intimate Partner Violence: Not on file   FH:  Family History  Problem Relation Age of Onset   Hypertension Mother    Heart disease Father    Stroke Father    Colon cancer Neg Hx    Colonic polyp Neg Hx     Past Medical History:  Diagnosis Date   Acute CHF (congestive heart failure) (HCC) 02/06/2020   Asthma  GERD (gastroesophageal reflux disease)    Pericardial effusion    Systolic HF (heart failure) (HCC)     Current Outpatient Medications  Medication Sig Dispense Refill   atorvastatin (LIPITOR) 80 MG tablet Take 1 tablet (80 mg total) by mouth daily. 90 tablet 3   carvedilol (COREG) 25 MG tablet Take 1 tablet (25 mg total) by mouth 2 (two) times daily. 60 tablet 11   empagliflozin (JARDIANCE) 10 MG TABS tablet Take 1 tablet (10 mg total) by mouth daily before breakfast. 30 tablet 11   furosemide (LASIX) 40 MG tablet Take 1 tablet (40 mg total) by mouth as needed. For weight gain of 3lb in 24 hours or 5lb in a week 90 tablet 1   hydrALAZINE (APRESOLINE) 25 MG tablet TAKE 1/2 OF A TABLET (12.5 MG  TOTAL) BY MOUTH 3 TIMES A DAY 45 tablet 0   sacubitril-valsartan (ENTRESTO) 97-103 MG Take 1 tablet by mouth 2 (two) times daily. 60 tablet 11   spironolactone (ALDACTONE) 25 MG tablet TAKE 1 TABLET (25 MG TOTAL) BY MOUTH DAILY. 90 tablet 3   isosorbide mononitrate (IMDUR) 30 MG 24 hr tablet Take 0.5 tablets (15 mg total) by mouth daily. (Patient not taking: Reported on 12/11/2022) 45 tablet 3   No current facility-administered medications for this encounter.   BP (!) 140/88   Pulse 70   Wt 105.9 kg (233 lb 6.4 oz)   SpO2 96%   BMI 36.56 kg/m   Wt Readings from Last 3 Encounters:  12/11/22 105.9 kg (233 lb 6.4 oz)  10/30/22 105 kg (231 lb 6.4 oz)  05/10/22 108 kg (238 lb)   PHYSICAL EXAM: General:  Well appearing. Muscular. No resp difficulty HEENT: normal Neck: supple. no JVD. Carotids 2+ bilat; no bruits. No lymphadenopathy or thryomegaly appreciated. Cor: PMI nondisplaced. Regular rate & rhythm. No rubs, gallops or murmurs. Lungs: clear Abdomen: soft, nontender, nondistended. No hepatosplenomegaly. No bruits or masses. Good bowel sounds. Extremities: no cyanosis, clubbing, rash, edema Neuro: alert & orientedx3, cranial nerves grossly intact. moves all 4 extremities w/o difficulty. Affect pleasant  ECG: NSR 73 1AVb +LBBB ( ) Personally reviewed   ASSESSMENT & PLAN: 1. Chronic systolic HF with severe biventricular dysfunction due to NICM - Echo (1/22):EF 20% severe RV dysfunction moderate pericardial effusion, suspect ETOH related +/- LBBB - Cath (02/13/20): with minimal CAD and massively volume overload. - cMRI (2/22): LVEF 17% RV Severe dysfunction, ? Acute myocarditis possible sarcoid. Wall thinning.   - Echo (07/09/20): EF 25-30% - Echo (3/23): EF 30-35% small effusion  - cMRI (8/23): LVEF 31%, RVEF 42%. No definitive evidence for prior MI, myocarditis, or infiltrative disease - Echo today 12/11/22  EF ~35-40% with septal dyssynchrony Personally reviewed - Stable  NYHA I-II Volume status ok. Takes lasix PRN (about once a month)  - Increase hydral to 25 tid l 12.5 mg tid. Off imdur due to HAs - Continue Lasix 40 PRN.  - Continue Coreg 25 mg bid. - Continue Entresto 97/103 bid - Continue spironolactone 25 mg daily - Continue Jardiance 10 mg daily. - Recent labs reviewed. They are ok  - He is not interested in ICD. If becomes more symptomatic and EF < 35% can consider CRT-D  2. Pericardial effusion. - Small on echo 3/23 - Small to moderate on cMRI 8/23 - trivial on echo today   3. Chest pain - LHC 2022 minimal CAD - No CP   4. HTN  - BP better but not at goal  -  Increase hydralazine to 25 tid  5. ETOH abuse - Has cut way back.   Arvilla Meres, MD  11:05 AM

## 2022-12-11 NOTE — Research (Signed)
SITE: [id number]     Subject #[id number]    Subprotocol: A  Inclusion Criteria  Patients who meet all of the following criteria are eligible for enrollment as study participants:  Yes No  Age > 54 years old    X   Eligible to wear Holter Study    X    Exclusion Criteria  Patients who meet any of these criteria are not eligible for enrollment as study participants: Yes No  1. Receiving any mechanical (respiratory or circulatory) or renal support therapy at Screening or during Visit #1.     X  2.  Any other conditions that in the opinion of the investigators are likely to prevent compliance with the study protocol or pose a safety concern if the subject participates in the study.     X  3. Poor tolerance, namely susceptible to severe skin allergies from ECG adhesive patch application.     X   Protocol: REV H                                     Residential Zip code [address] (First 3 digits ONLY)                                             [facility] Informed Consent   Subject Name: Craig Hanson  Subject met inclusion and exclusion criteria.  The informed consent form, study requirements and expectations were reviewed with the subject. Subject had opportunity to read consent and questions and concerns were addressed prior to the signing of the consent form.  The subject verbalized understanding of the trial requirements.  The subject agreed to participate in the [facility] EF - ACT trial and signed the informed consent at 08:57 on 11-Dec-2022.  The informed consent was obtained prior to performance of any protocol-specific procedures for the subject.  A copy of the signed informed consent was given to the subject and a copy was placed in the subject's medical record.   Dyanne Iha

## 2023-01-28 ENCOUNTER — Other Ambulatory Visit (HOSPITAL_COMMUNITY): Payer: Self-pay | Admitting: Internal Medicine

## 2023-03-10 ENCOUNTER — Other Ambulatory Visit (HOSPITAL_COMMUNITY): Payer: Self-pay | Admitting: Internal Medicine

## 2023-03-30 NOTE — Addendum Note (Signed)
 Encounter addended by: Howell Rucks, RDCS on: 03/30/2023 12:44 PM  Actions taken: Imaging Exam ended

## 2023-04-09 ENCOUNTER — Telehealth (HOSPITAL_COMMUNITY): Payer: Self-pay

## 2023-04-09 NOTE — Telephone Encounter (Signed)
 Called to confirm/remind patient of their appointment at the [facility] on 04/10/2023 at 10am.   Appointment:   [x] Confirmed   Patient reminded to bring all medications and/or complete list.  Confirmed patient has transportation. Gave directions, instructed to utilize valet parking.

## 2023-04-10 ENCOUNTER — Ambulatory Visit (HOSPITAL_COMMUNITY)
Admission: RE | Admit: 2023-04-10 | Discharge: 2023-04-10 | Disposition: A | Discharge status: Home / Self Care | Payer: Medicare PPO | Source: Ambulatory Visit | Attending: Family Medicine | Admitting: Family Medicine

## 2023-04-10 ENCOUNTER — Encounter (HOSPITAL_COMMUNITY): Payer: Self-pay

## 2023-04-10 VITALS — BP 160/100 | HR 86 | Wt 231.0 lb

## 2023-04-10 DIAGNOSIS — I428 Other cardiomyopathies: Secondary | ICD-10-CM | POA: Diagnosis not present

## 2023-04-10 DIAGNOSIS — F1010 Alcohol abuse, uncomplicated: Secondary | ICD-10-CM | POA: Diagnosis not present

## 2023-04-10 DIAGNOSIS — R0683 Snoring: Secondary | ICD-10-CM | POA: Insufficient documentation

## 2023-04-10 DIAGNOSIS — E782 Mixed hyperlipidemia: Secondary | ICD-10-CM

## 2023-04-10 DIAGNOSIS — I10 Essential (primary) hypertension: Secondary | ICD-10-CM | POA: Diagnosis not present

## 2023-04-10 DIAGNOSIS — I3139 Other pericardial effusion (noninflammatory): Secondary | ICD-10-CM | POA: Insufficient documentation

## 2023-04-10 DIAGNOSIS — R079 Chest pain, unspecified: Secondary | ICD-10-CM | POA: Diagnosis not present

## 2023-04-10 DIAGNOSIS — I2510 Atherosclerotic heart disease of native coronary artery without angina pectoris: Secondary | ICD-10-CM | POA: Diagnosis not present

## 2023-04-10 DIAGNOSIS — I5022 Chronic systolic (congestive) heart failure: Secondary | ICD-10-CM | POA: Insufficient documentation

## 2023-04-10 DIAGNOSIS — Z7984 Long term (current) use of oral hypoglycemic drugs: Secondary | ICD-10-CM | POA: Diagnosis not present

## 2023-04-10 DIAGNOSIS — R400 Somnolence: Secondary | ICD-10-CM

## 2023-04-10 DIAGNOSIS — I110 Hypertensive heart disease with heart failure: Secondary | ICD-10-CM | POA: Insufficient documentation

## 2023-04-10 DIAGNOSIS — Z79899 Other long term (current) drug therapy: Secondary | ICD-10-CM | POA: Diagnosis not present

## 2023-04-10 LAB — COMPREHENSIVE METABOLIC PANEL
ALT: 32 U/L (ref 0–44)
AST: 25 U/L (ref 15–41)
Albumin: 4 g/dL (ref 3.5–5.0)
Alkaline Phosphatase: 54 U/L (ref 38–126)
Anion gap: 11 (ref 5–15)
BUN: 5 mg/dL — ABNORMAL LOW (ref 6–20)
CO2: 25 mmol/L (ref 22–32)
Calcium: 9.1 mg/dL (ref 8.9–10.3)
Chloride: 101 mmol/L (ref 98–111)
Creatinine, Ser: 1.07 mg/dL (ref 0.61–1.24)
GFR, Estimated: >60 mL/min (ref >60)
Glucose, Bld: 114 mg/dL — ABNORMAL HIGH (ref 70–99)
Potassium: 3.2 mmol/L — ABNORMAL LOW (ref 3.5–5.1)
Sodium: 137 mmol/L (ref 135–145)
Total Bilirubin: 1.2 mg/dL (ref 0.0–1.2)
Total Protein: 7.2 g/dL (ref 6.5–8.1)

## 2023-04-10 LAB — LIPID PANEL
Cholesterol: 278 mg/dL — ABNORMAL HIGH (ref 0–200)
HDL: 40 mg/dL — ABNORMAL LOW (ref >40)
LDL Cholesterol: 173 mg/dL — ABNORMAL HIGH (ref 0–99)
Total CHOL/HDL Ratio: 7 ratio
Triglycerides: 326 mg/dL — ABNORMAL HIGH (ref <150)
VLDL: 65 mg/dL — ABNORMAL HIGH (ref 0–40)

## 2023-04-10 MED ORDER — ATORVASTATIN CALCIUM 80 MG PO TABS: 80.0000 mg | ORAL_TABLET | Freq: Every day | ORAL | 3 refills | Status: DC

## 2023-04-10 MED ORDER — HYDRALAZINE HCL 25 MG PO TABS: 25.0000 mg | ORAL_TABLET | Freq: Two times a day (BID) | ORAL | 3 refills | Status: DC

## 2023-04-10 NOTE — Progress Notes (Signed)
 Advanced Heart Failure Clinic Note   PCP: Bjorn Pippin, NP Primary Cardilogist: Dr. Diona Browner HF Cardiologist: Dr. Gala Romney  HPI: Mr Craig Hanson is a 53 y.o. male with a hx of EtOH abuse, chronic systolic HF, and pericardial effusion.    Admitted 02/06/20 with acute heart failure. R/LHC which showed minimal nonobstructive CAD, severe NICM EF <20% and markedly elevated filling pressures w/ preserved CO.1/29, co-ox dropped to 43% and milrinone added. Diuresed ~60 lb. cMRI showed severe biventricular dysfunction c/w NICM. Discharge weight 283 lbs.  cMRI (8/23) LVEF 31%, RVEF 42%  Follow up 1/24, patient wanted to hold off on ICD. Plan to push GDMT and repeat echo x 6 months.  Echo 11/24 EF 35-40% with septal dyssynchrony. Not interested in ICD.  Today he returns for HF follow up. No SOB with activity. Recovering from flu. Denies palpitations, abnormal bleeding, CP, dizziness, edema, or PND/Orthopnea. Appetite ok. No fever or chills. Weight at home 230-235 pounds. Taking all medications. Takes Lasix 1x/week, drinks 1 glass of ETOH 2x/week. Has daytime fatigue, ? Snoring. Has daytime sleepiness.  Cardiac Studies: Echo 11/24: EF 35-40%, septal dyssynchrony  cMRI (8/23): LVEF 31%, RVEF 42%, no definitive evidence for prior MI, myocarditis, or infiltrative disease.  Echo (3/23): EF 30-35% small effusion   cMRI (2/22): severe biventricular dysfunction and NICM. Moderate to large effusion. No tamponade.  R/LHC (02/13/20): Ost LAD to Prox LAD lesion is 20% stenosed, Ost Cx to Prox Cx lesion is 20% stenosed.  Ao = 110/84 (95) LV = 106/33 RA =  20 RV = 59/29 PA = 62/20 (43) PCW = 38 Fick cardiac output/index = 5.1/2.3 SVR = 1186 PVR = 1.0 WU FA sat = 96% PA sat = 64%, 68% SVC sat = 66% PAPi = 2.1   Assessment: 1. Minimal nonobstructive CAD 2. 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 Markedly elevated filling pressures with preserved cardiac output   Echo (02/07/20): EF 20-25%, global hypokinesis, grade  III dd, severe RV dysfunction, mod LAE, moderate pericardial effusion  ROS: All systems negative except as listed in HPI, PMH and Problem List.  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  Tobacco Use   Smoking status: Never   Smokeless tobacco: Never  Vaping Use   Vaping status: Never Used  Substance and Sexual Activity   Alcohol use: Not Currently    Alcohol/week: 14.0 - 21.0 standard drinks of alcohol    Types: 14 - 21 Cans of beer per week    Comment: 2-3 shots of liquor a few times a week   Drug use: Never   Sexual activity: Yes  Other Topics Concern   Not on file  Social History Narrative   Not on file   Social Drivers of Health   Financial Resource Strain: Not on file  Food Insecurity: Not on file  Transportation Needs: Not on file  Physical Activity: Not on file  Stress: Not on file  Social Connections: Not on file  Intimate Partner Violence: Not on file   FH:  Family History  Problem Relation Age of Onset   Hypertension Mother    Heart disease Father    Stroke Father    Colon cancer Neg Hx    Colonic  polyp Neg Hx    Past Medical History:  Diagnosis Date   Acute CHF (congestive heart failure) (HCC) 02/06/2020   Asthma    GERD (gastroesophageal reflux disease)    Pericardial effusion    Systolic HF (heart failure) (HCC)    Current Outpatient Medications  Medication Sig Dispense Refill   carvedilol (COREG) 25 MG tablet Take 1 tablet (25 mg total) by mouth 2 (two) times daily. 60 tablet 11   furosemide (LASIX) 40 MG tablet Take 1 tablet (40 mg total) by mouth as needed. For weight gain of 3lb in 24 hours or 5lb in a week 90 tablet 1   JARDIANCE 10 MG TABS tablet TAKE 1 TABLET BY MOUTH DAILY BEFORE BREAKFAST. 30 tablet 11    sacubitril-valsartan (ENTRESTO) 97-103 MG Take 1 tablet by mouth 2 (two) times daily. 60 tablet 11   spironolactone (ALDACTONE) 25 MG tablet TAKE 1 TABLET (25 MG TOTAL) BY MOUTH DAILY. 90 tablet 3   atorvastatin (LIPITOR) 80 MG tablet Take 1 tablet (80 mg total) by mouth daily. 90 tablet 3   hydrALAZINE (APRESOLINE) 25 MG tablet Take 1 tablet (25 mg total) by mouth in the morning and at bedtime. 180 tablet 3   No current facility-administered medications for this encounter.   BP (!) 160/100   Pulse 86   Wt 104.8 kg (231 lb)   SpO2 97%   BMI 36.18 kg/m   Wt Readings from Last 3 Encounters:  04/10/23 104.8 kg (231 lb)  12/11/22 105.9 kg (233 lb 6.4 oz)  10/30/22 105 kg (231 lb 6.4 oz)   PHYSICAL EXAM: General:  NAD. No resp difficulty, walked into clinic HEENT: Normal Neck: Supple. No JVD. Cor: Regular rate & rhythm. No rubs, gallops or murmurs. Lungs: Clear Abdomen: Soft, nontender, nondistended.  Extremities: No cyanosis, clubbing, rash, edema Neuro: Alert & oriented x 3, moves all 4 extremities w/o difficulty. Affect pleasant.  ASSESSMENT & PLAN: 1. Chronic systolic HF with severe biventricular dysfunction due to NICM - Echo (1/22):EF 20% severe RV dysfunction moderate pericardial effusion, suspect ETOH related +/- LBBB - Cath (02/13/20): with minimal CAD and massively volume overload. - cMRI (2/22): LVEF 17% RV Severe dysfunction, ? Acute myocarditis possible sarcoid. Wall thinning.   - Echo (07/09/20): EF 25-30% - Echo (3/23): EF 30-35% small effusion  - cMRI (8/23): LVEF 31%, RVEF 42%. No definitive evidence for prior MI, myocarditis, or infiltrative disease - Echo 12/11/22  EF ~35-40% with septal dyssynchrony Personally reviewed - Stable NYHA I-II Volume status ok. Takes lasix 40 mg PRN  - Increase hydral to 25 bid (unlikely to take tid). Off imdur due to HAs - Continue Coreg 25 mg bid. - Continue Entresto 97/103 bid - Continue spironolactone 25 mg daily. - Continue  Jardiance 10 mg daily. - He is not interested in ICD. If becomes more symptomatic and EF < 35% can consider CRT-D - Labs today.  2. Pericardial effusion. - Small on echo 3/23 - Small to moderate on cMRI 8/23 - Trivial on echo 11/24   3. Chest pain - LHC 2022 minimal CAD - No CP, resolved. - Continue statin   4. HTN  - BP remains elevated - Increase hydralazine to 25 bid (unlikely to take tid) - Given Rx for BP cuff, asked to check BP. Notify clinic if sBP > 140 - Needs sleep study  5. ETOH abuse - Has cut way back.  - Discussed cessation  6. Snoring/daytime sleepiness - arrange sleep study  Follow  up in 3-4 months with Dr. Gala Romney  Jacklynn Ganong, FNP  10:40 AM

## 2023-04-10 NOTE — Patient Instructions (Signed)
 Increase Hydralazine to 25 mg twice daily - updated Rx sent. Labs today - will call you if abnormal. Check blood pressure daily and keep a log. Notify our clinic if systolic BP is greater than 140. Rx provided for BP cuff. Sleep study has been ordered. The lab will call you to schedule - see info below. Return to see Dr. Leory Plowman in 4 months. Please call us at [phone] option 3 in June to schedule this appointment. Please call us at [phone] option 2 if any questions or concerns prior to your next appointment.

## 2023-04-12 ENCOUNTER — Encounter (HOSPITAL_COMMUNITY): Payer: Self-pay

## 2023-04-16 ENCOUNTER — Telehealth (HOSPITAL_COMMUNITY): Payer: Self-pay | Admitting: *Deleted

## 2023-04-16 ENCOUNTER — Other Ambulatory Visit (HOSPITAL_COMMUNITY): Payer: Self-pay | Admitting: *Deleted

## 2023-04-16 DIAGNOSIS — I5022 Chronic systolic (congestive) heart failure: Secondary | ICD-10-CM

## 2023-04-16 MED ORDER — POTASSIUM CHLORIDE CRYS ER 20 MEQ PO TBCR: 40.0000 meq | EXTENDED_RELEASE_TABLET | Freq: Every day | ORAL | 3 refills | Status: DC

## 2023-04-16 NOTE — Telephone Encounter (Signed)
-----   Message from Jacklynn Ganong sent at 04/10/2023  1:29 PM EDT ----- K is low. Start 40 KCL daily. Repeat BMET in 7-10 days  Lipids uncontrolled. Please call and ask if he has been consistently taking his atorvastatin.

## 2023-04-16 NOTE — Telephone Encounter (Signed)
 Result Note Spoke w/pt, he is aware, agreeable, and verbalized understanding, RX for KCL sent in, refill for Atorastatin also sent in, pt seeing PCP next week and will have labs drawn there, order placed

## 2023-05-01 ENCOUNTER — Encounter: Payer: Self-pay | Admitting: Internal Medicine

## 2023-05-01 ENCOUNTER — Ambulatory Visit (INDEPENDENT_AMBULATORY_CARE_PROVIDER_SITE_OTHER): Payer: Medicare PPO | Admitting: Internal Medicine

## 2023-05-01 VITALS — BP 123/78 | HR 78 | Ht 67.0 in | Wt 224.8 lb

## 2023-05-01 DIAGNOSIS — I10 Essential (primary) hypertension: Secondary | ICD-10-CM

## 2023-05-01 DIAGNOSIS — I5022 Chronic systolic (congestive) heart failure: Secondary | ICD-10-CM | POA: Diagnosis not present

## 2023-05-01 DIAGNOSIS — E559 Vitamin D deficiency, unspecified: Secondary | ICD-10-CM | POA: Diagnosis not present

## 2023-05-01 DIAGNOSIS — E782 Mixed hyperlipidemia: Secondary | ICD-10-CM | POA: Diagnosis not present

## 2023-05-01 DIAGNOSIS — R7303 Prediabetes: Secondary | ICD-10-CM | POA: Insufficient documentation

## 2023-05-01 DIAGNOSIS — K219 Gastro-esophageal reflux disease without esophagitis: Secondary | ICD-10-CM | POA: Diagnosis not present

## 2023-05-01 DIAGNOSIS — Z0001 Encounter for general adult medical examination with abnormal findings: Secondary | ICD-10-CM

## 2023-05-01 DIAGNOSIS — Z125 Encounter for screening for malignant neoplasm of prostate: Secondary | ICD-10-CM | POA: Insufficient documentation

## 2023-05-01 MED ORDER — FAMOTIDINE 20 MG PO TABS
20.0000 mg | ORAL_TABLET | Freq: Every day | ORAL | 5 refills | Status: DC | PRN
Start: 2023-05-01 — End: 2023-11-07

## 2023-05-01 NOTE — Patient Instructions (Addendum)
 Please schedule your Medicare Annual Wellness Visit at checkout.  Please take Famotidine as needed for acid reflux or stomach pain.  Please continue to take medications as prescribed.  Please continue to follow low salt diet and perform moderate exercise/walking as tolerated.

## 2023-05-01 NOTE — Assessment & Plan Note (Signed)
Physical exam as documented. Fasting blood tests ordered. 

## 2023-05-01 NOTE — Assessment & Plan Note (Signed)
 Epigastric discomfort and intermittent nausea could be due to gastritis Prescribed Pepcid as needed for acid reflux

## 2023-05-01 NOTE — Assessment & Plan Note (Signed)
 BP Readings from Last 1 Encounters:  05/01/23 123/78   Well-controlled with Coreg 25 mg twice daily, Entresto 97-103 mg once daily and spironolactone 25 mg once daily Had headache with Imdur Counseled for compliance with the medications Advised DASH diet and moderate exercise/walking as tolerated

## 2023-05-01 NOTE — Assessment & Plan Note (Signed)
Followed by CHF clinic On Entresto, Coreg, Jardiance, spironolactone and as needed Lasix Did not tolerate Imdur and Hydralazine together Appears to be euvolemic currently Last echo showed LVEF 30-35%

## 2023-05-01 NOTE — Assessment & Plan Note (Signed)
 Lab Results  Component Value Date   HGBA1C 5.7 (H) 04/21/2022   Advised to follow DASH diet

## 2023-05-01 NOTE — Progress Notes (Signed)
 Established Patient Office Visit  Subjective:  Patient ID: Craig Hanson, male    DOB: May 11, 1970  Age: 53 y.o. MRN: [id number]  CC:  Chief Complaint  Patient presents with   Annual Exam    Annual exam/ 6 month f/u    HPI Craig Hanson is a 53 y.o. male with past medical history of HFrEF who presents for annual physical.  He has been doing well overall. His BP is wnl today.  He is currently taking Coreg 25 mg twice daily, Entresto 97-103 mg BID and spironolactone 25 mg QD.  He was recently placed on hydralazine 25 mg 2 times daily.  He had headache from Imdur in the past and was also having dizziness, and stopped taking them after a month.  Denies any recent worsening of leg swelling or dyspnea. Denies any chest pain, dizziness or palpitations currently. Followed by CHF clinic.  He has lost 7 lbs since the last visit. He has stopped taking alcoholic beverages.  He reports intermittent nausea/vomiting and epigastric discomfort with medications.  Does not report dysphagia or odynophagia.  Past Medical History:  Diagnosis Date   Acute CHF (congestive heart failure) (HCC) 02/06/2020   Asthma    GERD (gastroesophageal reflux disease)    Pericardial effusion    Systolic HF (heart failure) (HCC)     Past Surgical History:  Procedure Laterality Date   COLONOSCOPY WITH PROPOFOL N/A 10/31/2021   Procedure: COLONOSCOPY WITH PROPOFOL;  Surgeon: Lanelle Bal, DO;  Location: [facility];  Service: Endoscopy;  Laterality: N/A;  8:30am, asa 3   No prior surgery     RIGHT/LEFT HEART CATH AND CORONARY ANGIOGRAPHY N/A 02/13/2020   Procedure: RIGHT/LEFT HEART CATH AND CORONARY ANGIOGRAPHY;  Surgeon: Dolores Patty, MD;  Location: [facility];  Service: Cardiovascular;  Laterality: N/A;    Family History  Problem Relation Age of Onset   Hypertension Mother    Heart disease Father    Stroke Father    Colon cancer Neg Hx    Colonic polyp Neg Hx     Social History    Socioeconomic History   Marital status: Married    Spouse name: Not on file   Number of children: Not on file   Years of education: Not on file   Highest education level: Not on file  Occupational History    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  Tobacco Use   Smoking status: Never   Smokeless tobacco: Never  Vaping Use   Vaping status: Never Used  Substance and Sexual Activity   Alcohol use: Not Currently    Alcohol/week: 14.0 - 21.0 standard drinks of alcohol    Types: 14 - 21 Cans of beer per week    Comment: 2-3 shots of liquor a few times a week   Drug use: Never   Sexual activity: Yes  Other Topics Concern   Not on file  Social History Narrative   Not on file   Social Drivers of Health   Financial Resource Strain: Not on file  Food Insecurity: Not on file  Transportation Needs: Not on file  Physical Activity: Not on file  Stress: Not on file  Social Connections: Not on file  Intimate Partner Violence: Not on file    Outpatient Medications Prior to Visit  Medication Sig Dispense Refill   atorvastatin (LIPITOR) 80 MG tablet Take 1 tablet (80 mg total) by mouth daily. 90 tablet 3   carvedilol (COREG) 25 MG  tablet Take 1 tablet (25 mg total) by mouth 2 (two) times daily. 60 tablet 11   furosemide (LASIX) 40 MG tablet Take 1 tablet (40 mg total) by mouth as needed. For weight gain of 3lb in 24 hours or 5lb in a week 90 tablet 1   hydrALAZINE (APRESOLINE) 25 MG tablet Take 1 tablet (25 mg total) by mouth in the morning and at bedtime. 180 tablet 3   JARDIANCE 10 MG TABS tablet TAKE 1 TABLET BY MOUTH DAILY BEFORE BREAKFAST. 30 tablet 11   potassium chloride SA (KLOR-CON M) 20 MEQ tablet Take 2 tablets (40 mEq total) by mouth daily. 60 tablet 3   sacubitril-valsartan (ENTRESTO) 97-103 MG Take 1 tablet by mouth 2 (two) times daily. 60 tablet 11   spironolactone (ALDACTONE) 25 MG tablet TAKE 1 TABLET (25 MG TOTAL) BY MOUTH DAILY. 90 tablet 3   No  facility-administered medications prior to visit.    No Known Allergies  ROS Review of Systems  Constitutional:  Negative for chills and fever.  HENT:  Negative for congestion and sore throat.   Eyes:  Negative for pain and discharge.  Respiratory:  Negative for cough and shortness of breath.   Cardiovascular:  Negative for chest pain and palpitations.  Gastrointestinal:  Negative for constipation, diarrhea, nausea and vomiting.  Endocrine: Negative for polydipsia and polyuria.  Genitourinary:  Negative for dysuria and hematuria.  Musculoskeletal:  Positive for arthralgias (B/l wrist and hand joints with swelling). Negative for neck pain and neck stiffness.  Skin:  Negative for rash.  Neurological:  Negative for dizziness, weakness, numbness and headaches.  Psychiatric/Behavioral:  Negative for agitation and behavioral problems.       Objective:    Physical Exam Vitals reviewed.  Constitutional:      General: He is not in acute distress.    Appearance: He is not diaphoretic.  HENT:     Head: Normocephalic and atraumatic.     Nose: Nose normal.     Mouth/Throat:     Mouth: Mucous membranes are moist.  Eyes:     General: No scleral icterus.    Extraocular Movements: Extraocular movements intact.  Cardiovascular:     Rate and Rhythm: Normal rate and regular rhythm.     Heart sounds: No murmur heard. Pulmonary:     Breath sounds: Normal breath sounds. No wheezing or rales.  Abdominal:     Palpations: Abdomen is soft.     Tenderness: There is no abdominal tenderness.  Musculoskeletal:     Right hand: Swelling (PIP and DIP joints) present.     Left hand: Swelling (PIP and DIP joints) present.     Cervical back: Neck supple. No tenderness.     Right lower leg: No edema.     Left lower leg: No edema.  Skin:    General: Skin is warm.     Findings: No rash.  Neurological:     General: No focal deficit present.     Mental Status: He is alert and oriented to person, place,  and time.     Cranial Nerves: No cranial nerve deficit.     Sensory: No sensory deficit.     Motor: No weakness.  Psychiatric:        Mood and Affect: Mood normal.        Behavior: Behavior normal.     BP 123/78   Pulse 78   Ht 5\' 7"  (1.702 m)   Wt 224 lb 12.8 oz (102  kg)   SpO2 96%   BMI 35.21 kg/m  Wt Readings from Last 3 Encounters:  05/01/23 224 lb 12.8 oz (102 kg)  04/10/23 231 lb (104.8 kg)  12/11/22 233 lb 6.4 oz (105.9 kg)    Lab Results  Component Value Date   TSH 2.560 04/21/2022   Lab Results  Component Value Date   WBC 7.7 04/21/2022   HGB 14.2 04/21/2022   HCT 41.6 04/21/2022   MCV 92 04/21/2022   PLT 214 04/21/2022   Lab Results  Component Value Date   NA 137 04/10/2023   K 3.2 (L) 04/10/2023   CO2 25 04/10/2023   GLUCOSE 114 (H) 04/10/2023   BUN 5 (L) 04/10/2023   CREATININE 1.07 04/10/2023   BILITOT 1.2 04/10/2023   ALKPHOS 54 04/10/2023   AST 25 04/10/2023   ALT 32 04/10/2023   PROT 7.2 04/10/2023   ALBUMIN 4.0 04/10/2023   CALCIUM 9.1 04/10/2023   ANIONGAP 11 04/10/2023   EGFR 79 10/30/2022   Lab Results  Component Value Date   CHOL 278 (H) 04/10/2023   Lab Results  Component Value Date   HDL 40 (L) 04/10/2023   Lab Results  Component Value Date   LDLCALC 173 (H) 04/10/2023   Lab Results  Component Value Date   TRIG 326 (H) 04/10/2023   Lab Results  Component Value Date   CHOLHDL 7.0 04/10/2023   Lab Results  Component Value Date   HGBA1C 5.7 (H) 04/21/2022      Assessment & Plan:   Problem List Items Addressed This Visit       Cardiovascular and Mediastinum   Chronic systolic heart failure (HCC)   Followed by CHF clinic On Entresto, Coreg, Jardiance, spironolactone and as needed Lasix Did not tolerate Imdur and Hydralazine together Appears to be euvolemic currently Last echo showed LVEF 30-35%      Relevant Orders   CMP14+EGFR   TSH   Essential hypertension   BP Readings from Last 1 Encounters:   05/01/23 123/78   Well-controlled with Coreg 25 mg twice daily, Entresto 97-103 mg once daily and spironolactone 25 mg once daily Had headache with Imdur Counseled for compliance with the medications Advised DASH diet and moderate exercise/walking as tolerated        Digestive   Gastroesophageal reflux disease   Epigastric discomfort and intermittent nausea could be due to gastritis Prescribed Pepcid as needed for acid reflux      Relevant Medications   famotidine (PEPCID) 20 MG tablet     Other   Encounter for general adult medical examination with abnormal findings - Primary   Physical exam as documented. Fasting blood tests ordered.      Mixed hyperlipidemia   Last lipid profile showed elevated LDL - had run out of Lipitor On Lipitor 80 mg once daily, needs to be compliant       Relevant Orders   Lipid Profile   Prediabetes   Lab Results  Component Value Date   HGBA1C 5.7 (H) 04/21/2022   Advised to follow DASH diet      Relevant Orders   Hemoglobin A1c   Prostate cancer screening   Ordered PSA after discussing its limitations for prostate cancer screening, including false positive results leading to additional investigations.      Relevant Orders   PSA   Other Visit Diagnoses       Vitamin D deficiency       Relevant Orders   Vitamin D (25  hydroxy)         Meds ordered this encounter  Medications   famotidine (PEPCID) 20 MG tablet    Sig: Take 1 tablet (20 mg total) by mouth daily as needed for heartburn or indigestion.    Dispense:  30 tablet    Refill:  5    Follow-up: Return in about 6 months (around 10/31/2023) for HLD and CHF.    Meldon Sport, MD

## 2023-05-01 NOTE — Assessment & Plan Note (Signed)
 Ordered PSA after discussing its limitations for prostate cancer screening, including false positive results leading to additional investigations.

## 2023-05-01 NOTE — Assessment & Plan Note (Addendum)
 Last lipid profile showed elevated LDL - had run out of Lipitor On Lipitor 80 mg once daily, needs to be compliant

## 2023-05-15 ENCOUNTER — Other Ambulatory Visit: Payer: Self-pay | Admitting: Internal Medicine

## 2023-05-15 NOTE — Telephone Encounter (Signed)
 Sent 03/12/23 with 11 refills

## 2023-05-15 NOTE — Telephone Encounter (Signed)
 Copied from CRM [id number]. Topic: General - Other >> May 15, 2023  8:44 AM Baldomero Bone wrote: Reason for CRM: [employer] needs verification of heart condition so that patient's insurance can be changed for better benefits. Please call [phone]

## 2023-05-15 NOTE — Telephone Encounter (Signed)
 Copied from CRM [id number]. Topic: Clinical - Medication Refill >> May 15, 2023  8:46 AM Baldomero Bone wrote: Most Recent Primary Care Visit:  Provider: Meldon Sport  Department: [facility]  Visit Type: PHYSICAL  Date: 05/01/2023  Medication: JARDIANCE  10 MG TABS tablet   Has the patient contacted their pharmacy? Yes (Agent: If no, request that the patient contact the pharmacy for the refill. If patient does not wish to contact the pharmacy document the reason why and proceed with request.) (Agent: If yes, when and what did the pharmacy advise?)  Is this the correct pharmacy for this prescription? Yes If no, delete pharmacy and type the correct one.  This is the patient's preferred pharmacy:  [id number] - [place], [place] - [address] [place] [address] Phone: [phone] Fax: [phone]   Has the prescription been filled recently? No  Is the patient out of the medication? No  Has the patient been seen for an appointment in the last year OR does the patient have an upcoming appointment? Yes  Can we respond through MyChart? Yes  Agent: Please be advised that Rx refills may take up to 3 business days. We ask that you follow-up with your pharmacy.

## 2023-05-17 NOTE — Telephone Encounter (Signed)
 Spoke to representative gave them pt's dx .

## 2023-05-29 ENCOUNTER — Other Ambulatory Visit: Payer: Self-pay | Admitting: Internal Medicine

## 2023-06-26 ENCOUNTER — Telehealth: Payer: Self-pay | Admitting: Pharmacist

## 2023-06-26 NOTE — Telephone Encounter (Signed)
   This patient is appearing on a report for being at risk of failing the adherence measure for diabetes medications this calendar year.   Medication: Jardiance  Last fill date: 06/01/23 for 30 day supply  Insurance report was not up to date. No action needed at this time.    Francesca Strome Dattero Cait Locust, PharmD, BCACP, CPP Clinical Pharmacist, [facility]

## 2023-08-07 ENCOUNTER — Other Ambulatory Visit (HOSPITAL_COMMUNITY): Payer: Self-pay | Admitting: Family Medicine

## 2023-08-15 ENCOUNTER — Ambulatory Visit

## 2023-08-15 VITALS — BP 120/70 | Ht 67.5 in | Wt 220.0 lb

## 2023-08-15 DIAGNOSIS — Z0000 Encounter for general adult medical examination without abnormal findings: Secondary | ICD-10-CM

## 2023-08-15 NOTE — Patient Instructions (Addendum)
 [name] ,  Thank you for taking time out of your busy schedule to complete your Annual Wellness Visit with me. I enjoyed our conversation and look forward to speaking with you again next year. I, as well as your care team,  appreciate your ongoing commitment to your health goals. Please review the following plan we discussed and let me know if I can assist you in the future.  I enjoyed our conversation and look forward to it again next year. Blessing for the upcoming year!!  -Graceann Boileau     TOGETHER, WE CAME UP WITH THE FOLLOWING GAME PLAN!  Referrals/Orders/Labs Placed Today:  There are several Eye Doctors in your area. Here are a few that usually accept all insurance types:  [facility] [address] [place], [place] [address] Phone: [phone]  [facility] [address] [place], [place] [address] Phone: [phone]  [facility] [address] [place], [place] [address] Phone: [phone]  [facility] [address] [facility] [place], [place] [address] Phone: [phone]  [facility] [address] [place], [place] [address] Phone: [phone]  Please let us  know if you require a referral for an eye exam appointment. Thank you!  Follow up Visits Primary Care Provider: Nov 02, 2023 at 8:00 am Medicare Annual Wellness: Aug 18, 2024 at 9:20 am video  Clinician Recommendations:  Aim for 30 minutes of exercise or brisk walking, 6-8 glasses of water, and 5 servings of fruits and vegetables each day.       This is a list of the screening recommended for you and due dates:  Health Maintenance  Topic Date Due   DTaP/Tdap/Td vaccine (1 - Tdap) Never done   Zoster (Shingles) Vaccine (2 of 2) 05/22/2021   COVID-19 Vaccine (3 - 2024-25 season) 09/17/2022   Flu Shot  08/17/2023   Medicare Annual Wellness Visit  08/05/2024   DEXA scan (bone density measurement)  05/13/2025   Pneumococcal Vaccine for age over 84  Completed   Hepatitis B  Vaccine  Aged Out   HPV Vaccine  Aged Out   Meningitis B Vaccine  Aged Out   Hepatitis C Screening  Discontinued    Advanced directives: (Provided) Advance directive discussed with you today. I have provided a copy for you to complete at home and have notarized. Once this is complete, please bring a copy in to our office so we can scan it into your chart.  Advance Care Planning is important because it:  Packet will be mailed to you because you visit today was a telehealth visit.   [x]  Makes sure you receive the medical care that is consistent with your values, goals, and preferences  [x]  It provides guidance to your family and loved ones and reduces their decisional burden about whether or not they are making the right decisions based on your wishes.  Follow the link provided in your after visit summary or read over the paperwork we have mailed to you to help you started getting your Advance Directives in place. If you need assistance in completing these, please reach out to us  so that we can help you!  Follow the link provided in your after visit summary or read over the paperwork we have mailed to you to help you started getting your Advance Directives in place. If you need assistance in completing these, please reach out to us  so that we can help you!  We  will mail you an Advanced Directives packet as we discussed during your visit today. You do not have to have an attorney to complete this paperwork. Read over the packet, discuss it with family, and complete it. Choose who will be making decisions for you in the event you can no longer make them for yourself. Once completed have them notarized, and bring the packet back to the office. We will scan it and make sure it gets into your chart.   If you choose to have a DNR (Do Not Resuscitate Order) you must get this from your primary care doctor because they have to sign it. You can get this in the office during an appointment.   THIS ORDER  MUST BE VISIBLE AT ALL TIMES WITHIN YOUR HOME SUCH AS ON A REFRIGERATOR.

## 2023-08-15 NOTE — Progress Notes (Signed)
 Please attest and cosign this visit due to patients primary care provider not being in the office at the time the visit was completed.   Subjective:   Craig Hanson is a 53 y.o. who presents for a Medicare Wellness preventive visit.  As a reminder, Annual Wellness Visits don't include a physical exam, and some assessments may be limited, especially if this visit is performed virtually. We may recommend an in-person follow-up visit with your provider if needed.  Visit Complete: Virtual I connected with  Curtistine JONETTA Gearing on 08/15/23 by a audio enabled telemedicine application and verified that I am speaking with the correct person using two identifiers.  Patient Location: Home  Provider Location: Home Office  I discussed the limitations of evaluation and management by telemedicine. The patient expressed understanding and agreed to proceed.  Vital Signs: Because this visit was a virtual/telehealth visit, some criteria may be missing or patient reported. Any vitals not documented were not able to be obtained and vitals that have been documented are patient reported.  VideoDeclined- This patient declined Librarian, academic. Therefore the visit was completed with audio only.  Persons Participating in Visit: Patient.  AWV Questionnaire: No: Patient Medicare AWV questionnaire was not completed prior to this visit.  Cardiac Risk Factors include: advanced age (>41men, >54 women);male gender;dyslipidemia;obesity (BMI >30kg/m2);hypertension;Other (see comment), Risk factor comments: systolic heart failure     Objective:    Today's Vitals   08/15/23 0809  BP: 120/70  Weight: 220 lb (99.8 kg)  Height: 5' 7.5 (1.715 m)   Body mass index is 33.95 kg/m.     08/15/2023    8:18 AM 10/31/2021    8:01 AM 10/31/2021    7:54 AM 02/06/2020    4:00 PM  Advanced Directives  Does Patient Have a Medical Advance Directive? No No No No  Would patient like  information on creating a medical advance directive? Yes (MAU/Ambulatory/Procedural Areas - Information given) No - Patient declined No - Patient declined No - Patient declined    Current Medications (verified) Outpatient Encounter Medications as of 08/15/2023  Medication Sig   atorvastatin  (LIPITOR) 80 MG tablet Take 1 tablet (80 mg total) by mouth daily.   carvedilol  (COREG ) 25 MG tablet Take 1 tablet (25 mg total) by mouth 2 (two) times daily.   famotidine  (PEPCID ) 20 MG tablet Take 1 tablet (20 mg total) by mouth daily as needed for heartburn or indigestion.   furosemide  (LASIX ) 40 MG tablet Take 1 tablet (40 mg total) by mouth as needed. For weight gain of 3lb in 24 hours or 5lb in a week   hydrALAZINE  (APRESOLINE ) 25 MG tablet Take 1 tablet (25 mg total) by mouth in the morning and at bedtime.   JARDIANCE  10 MG TABS tablet TAKE 1 TABLET BY MOUTH DAILY BEFORE BREAKFAST.   potassium chloride  SA (KLOR-CON  M) 20 MEQ tablet TAKE 2 TABLETS BY MOUTH DAILY   sacubitril -valsartan  (ENTRESTO ) 97-103 MG Take 1 tablet by mouth 2 (two) times daily.   spironolactone  (ALDACTONE ) 25 MG tablet TAKE 1 TABLET (25 MG TOTAL) BY MOUTH DAILY.   No facility-administered encounter medications on file as of 08/15/2023.    Allergies (verified) Patient has no known allergies.   History: Past Medical History:  Diagnosis Date   Acute CHF (congestive heart failure) (HCC) 02/06/2020   Asthma    GERD (gastroesophageal reflux disease)    Pericardial effusion    Systolic HF (heart failure) (HCC)    Past  Surgical History:  Procedure Laterality Date   COLONOSCOPY WITH PROPOFOL  N/A 10/31/2021   Procedure: COLONOSCOPY WITH PROPOFOL ;  Surgeon: Cindie Carlin POUR, DO;  Location: [facility];  Service: Endoscopy;  Laterality: N/A;  8:30am, asa 3   No prior surgery     RIGHT/LEFT HEART CATH AND CORONARY ANGIOGRAPHY N/A 02/13/2020   Procedure: RIGHT/LEFT HEART CATH AND CORONARY ANGIOGRAPHY;  Surgeon: Cherrie Toribio SAUNDERS, MD;  Location: [facility];  Service: Cardiovascular;  Laterality: N/A;   Family History  Problem Relation Age of Onset   Hypertension Mother    Heart disease Father    Stroke Father    Colon cancer Neg Hx    Colonic polyp Neg Hx    Social History   Socioeconomic History   Marital status: Married    Spouse name: Not on file   Number of children: Not on file   Years of education: Not on file   Highest education level: Not on file  Occupational History    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  Tobacco Use   Smoking status: Never   Smokeless tobacco: Never  Vaping Use   Vaping status: Never Used  Substance and Sexual Activity   Alcohol use: Not Currently    Alcohol/week: 14.0 - 21.0 standard drinks of alcohol    Types: 14 - 21 Cans of beer per week    Comment: 2-3 shots of liquor a few times a week   Drug use: Never   Sexual activity: Yes  Other Topics Concern   Not on file  Social History Narrative   Not on file   Social Drivers of Health   Financial Resource Strain: Low Risk  (08/15/2023)   Overall Financial Resource Strain (CARDIA)    Difficulty of Paying Living Expenses: Not hard at all  Food Insecurity: No Food Insecurity (08/15/2023)   Hunger Vital Sign    Worried About Running Out of Food in the Last Year: Never true    Ran Out of Food in the Last Year: Never true  Transportation Needs: No Transportation Needs (08/15/2023)   PRAPARE - Administrator, Civil Service (Medical): No    Lack of Transportation (Non-Medical): No  Physical Activity: Sufficiently Active (08/15/2023)   Exercise Vital Sign    Days of Exercise per Week: 7 days    Minutes of Exercise per Session: 30 min  Stress: No Stress Concern Present (08/15/2023)   [employer] of Occupational Health - Occupational Stress Questionnaire    Feeling of Stress: Not at all  Social Connections: Moderately Isolated (08/15/2023)   Social Connection and Isolation Panel     Frequency of Communication with Friends and Family: More than three times a week    Frequency of Social Gatherings with Friends and Family: More than three times a week    Attends Religious Services: Never    Database administrator or Organizations: No    Attends Engineer, structural: Never    Marital Status: Married    Tobacco Counseling Counseling given: Yes    Clinical Intake:  Pre-visit preparation completed: Yes  Pain : No/denies pain     BMI - recorded: 33.95 Nutritional Status: BMI > 30  Obese Nutritional Risks: None Diabetes: No  Lab Results  Component Value Date   HGBA1C 5.7 (H) 04/21/2022   HGBA1C 5.5 04/07/2021   HGBA1C 5.3 09/29/2020     How often do you need to have someone help you when you read  instructions, pamphlets, or other written materials from your doctor or pharmacy?: 1 - Never  Interpreter Needed?: No  Information entered by :: A Omar Gayden CMA-AAMA   Activities of Daily Living     08/15/2023    8:21 AM  In your present state of health, do you have any difficulty performing the following activities:  Hearing? 0  Vision? 0  Difficulty concentrating or making decisions? 0  Walking or climbing stairs? 0  Dressing or bathing? 0  Doing errands, shopping? 0  Preparing Food and eating ? N  Using the Toilet? N  In the past six months, have you accidently leaked urine? N  Do you have problems with loss of bowel control? N  Managing your Medications? N  Managing your Finances? N  Housekeeping or managing your Housekeeping? N    Patient Care Team: Tobie Suzzane POUR, MD as PCP - General (Internal Medicine) Cindie Carlin POUR, DO as Consulting Physician (Internal Medicine) Bensimhon, Toribio SAUNDERS, MD as Consulting Physician (Cardiology) Debera Jayson MATSU, MD as Consulting Physician (Cardiology)  I have updated your Care Teams any recent Medical Services you may have received from other providers in the past year.     Assessment:   This  is a routine wellness examination for [place].  Hearing/Vision screen Hearing Screening - Comments:: Patient denies any hearing difficulties.   Vision Screening - Comments:: Patient does not have an eye doctor. Resources provided to patient. Patient advised to call eye doctors and ask if they accept his insurance and if he could make a new patient appt.     Goals Addressed               This Visit's Progress     Patient Stated (pt-stated)        I want to walk more.        Depression Screen     08/15/2023    8:27 AM 05/01/2023    8:01 AM 10/30/2022    9:28 AM 04/21/2022    8:03 AM 01/24/2022    9:15 AM 11/02/2021    9:41 AM 04/13/2021    3:07 PM  PHQ 2/9 Scores  PHQ - 2 Score 0 0 0 0 3 0 0  PHQ- 9 Score 0 0  0 9      Fall Risk     08/15/2023    8:24 AM 05/01/2023    8:01 AM 10/30/2022    9:28 AM 04/21/2022    8:03 AM 01/24/2022    9:15 AM  Fall Risk   Falls in the past year? 0 0 0 0 0  Number falls in past yr: 0 0 0 0 0  Injury with Fall? 0 0 0 0 0  Risk for fall due to : No Fall Risks No Fall Risks     Follow up Falls evaluation completed;Education provided;Falls prevention discussed Falls evaluation completed       MEDICARE RISK AT HOME:  Medicare Risk at Home Any stairs in or around the home?: Yes If so, are there any without handrails?: No Home free of loose throw rugs in walkways, pet beds, electrical cords, etc?: Yes Adequate lighting in your home to reduce risk of falls?: Yes Life alert?: No Use of a cane, walker or w/c?: No Grab bars in the bathroom?: Yes Shower chair or bench in shower?: No Elevated toilet seat or a handicapped toilet?: No  TIMED UP AND GO:  Was the test performed?  No  Cognitive Function: 6CIT completed  08/15/2023    8:24 AM  6CIT Screen  What Year? 0 points  What month? 0 points  What time? 0 points  Count back from 20 0 points  Months in reverse 0 points  Repeat phrase 0 points  Total Score 0 points     Immunizations Immunization History  Administered Date(s) Administered   Influenza, Seasonal, Injecte, Preservative Fre 10/30/2022   Moderna Sars-Covid-2 Vaccination 10/18/2019, 11/18/2019   PNEUMOCOCCAL CONJUGATE-20 04/21/2022   Tdap 04/13/2021   Zoster Recombinant(Shingrix ) 04/13/2021, 11/02/2021    Screening Tests Health Maintenance  Topic Date Due   Hepatitis B Vaccines (1 of 3 - 19+ 3-dose series) Never done   INFLUENZA VACCINE  08/17/2023   Medicare Annual Wellness (AWV)  08/14/2024   DTaP/Tdap/Td (2 - Td or Tdap) 04/14/2031   Colonoscopy  11/01/2031   Pneumococcal Vaccine 75-66 Years old  Completed   Hepatitis C Screening  Completed   HIV Screening  Completed   Zoster Vaccines- Shingrix   Completed   HPV VACCINES  Aged Out   Meningococcal B Vaccine  Aged Out   COVID-19 Vaccine  Discontinued    Health Maintenance  Health Maintenance Due  Topic Date Due   Hepatitis B Vaccines (1 of 3 - 19+ 3-dose series) Never done   Health Maintenance Items Addressed: Health Maintenance is up to date. Patient aware of Hep B vaccine and verbalized understanding  Additional Screening:  Vision Screening: Recommended annual ophthalmology exams for early detection of glaucoma and other disorders of the eye. Would you like a referral to an eye doctor? Patient provided with information of eye doctors he can call to schedule a new patient appt with  Dental Screening: Recommended annual dental exams for proper oral hygiene  Community Resource Referral / Chronic Care Management: CRR required this visit?  No   CCM required this visit?  No   Plan:    I have personally reviewed and noted the following in the patient's chart:   Medical and social history Use of alcohol, tobacco or illicit drugs  Current medications and supplements including opioid prescriptions. Patient is not currently taking opioid prescriptions. Functional ability and status Nutritional status Physical  activity Advanced directives List of other physicians Hospitalizations, surgeries, and ER visits in previous 12 months Vitals Screenings to include cognitive, depression, and falls Referrals and appointments  In addition, I have reviewed and discussed with patient certain preventive protocols, quality metrics, and best practice recommendations. A written personalized care plan for preventive services as well as general preventive health recommendations were provided to patient.   Malina Geers, CMA   08/15/2023   After Visit Summary: (MyChart) Due to this being a telephonic visit, the after visit summary with patients personalized plan was offered to patient via MyChart   Notes: Nothing significant to report at this time.

## 2023-10-30 ENCOUNTER — Ambulatory Visit (HOSPITAL_COMMUNITY)
Admission: RE | Admit: 2023-10-30 | Discharge: 2023-10-30 | Disposition: A | Discharge status: Home / Self Care | Source: Ambulatory Visit | Attending: Internal Medicine | Admitting: Internal Medicine

## 2023-10-30 VITALS — BP 144/88 | HR 89 | Wt 220.6 lb

## 2023-10-30 DIAGNOSIS — I2510 Atherosclerotic heart disease of native coronary artery without angina pectoris: Secondary | ICD-10-CM | POA: Insufficient documentation

## 2023-10-30 DIAGNOSIS — I447 Left bundle-branch block, unspecified: Secondary | ICD-10-CM

## 2023-10-30 DIAGNOSIS — F1010 Alcohol abuse, uncomplicated: Secondary | ICD-10-CM

## 2023-10-30 DIAGNOSIS — I110 Hypertensive heart disease with heart failure: Secondary | ICD-10-CM | POA: Insufficient documentation

## 2023-10-30 DIAGNOSIS — I498 Other specified cardiac arrhythmias: Secondary | ICD-10-CM | POA: Insufficient documentation

## 2023-10-30 DIAGNOSIS — I428 Other cardiomyopathies: Secondary | ICD-10-CM | POA: Diagnosis not present

## 2023-10-30 DIAGNOSIS — I10 Essential (primary) hypertension: Secondary | ICD-10-CM

## 2023-10-30 DIAGNOSIS — I454 Nonspecific intraventricular block: Secondary | ICD-10-CM | POA: Insufficient documentation

## 2023-10-30 DIAGNOSIS — Z79899 Other long term (current) drug therapy: Secondary | ICD-10-CM | POA: Diagnosis not present

## 2023-10-30 DIAGNOSIS — I5022 Chronic systolic (congestive) heart failure: Secondary | ICD-10-CM

## 2023-10-30 DIAGNOSIS — R079 Chest pain, unspecified: Secondary | ICD-10-CM | POA: Insufficient documentation

## 2023-10-30 DIAGNOSIS — R0683 Snoring: Secondary | ICD-10-CM | POA: Diagnosis not present

## 2023-10-30 DIAGNOSIS — Z8249 Family history of ischemic heart disease and other diseases of the circulatory system: Secondary | ICD-10-CM | POA: Diagnosis not present

## 2023-10-30 DIAGNOSIS — I3139 Other pericardial effusion (noninflammatory): Secondary | ICD-10-CM | POA: Diagnosis not present

## 2023-10-30 LAB — COMPREHENSIVE METABOLIC PANEL WITH GFR
ALT: 25 U/L (ref 0–44)
AST: 25 U/L (ref 15–41)
Albumin: 4.1 g/dL (ref 3.5–5.0)
Alkaline Phosphatase: 60 U/L (ref 38–126)
Anion gap: 11 (ref 5–15)
BUN: 8 mg/dL (ref 6–20)
CO2: 23 mmol/L (ref 22–32)
Calcium: 9.4 mg/dL (ref 8.9–10.3)
Chloride: 99 mmol/L (ref 98–111)
Creatinine, Ser: 1.26 mg/dL — ABNORMAL HIGH (ref 0.61–1.24)
GFR, Estimated: >60 mL/min (ref >60)
Glucose, Bld: 106 mg/dL — ABNORMAL HIGH (ref 70–99)
Potassium: 4.3 mmol/L (ref 3.5–5.1)
Sodium: 133 mmol/L — ABNORMAL LOW (ref 135–145)
Total Bilirubin: 0.6 mg/dL (ref 0.0–1.2)
Total Protein: 7.5 g/dL (ref 6.5–8.1)

## 2023-10-30 LAB — BRAIN NATRIURETIC PEPTIDE: B Natriuretic Peptide: 62.7 pg/mL (ref 0.0–100.0)

## 2023-10-30 MED ORDER — HYDRALAZINE HCL 50 MG PO TABS: 50.0000 mg | ORAL_TABLET | Freq: Two times a day (BID) | ORAL | 3 refills | Status: DC

## 2023-10-30 NOTE — Progress Notes (Signed)
 [facility] Note   PCP: Fairy Pereyra, NP Primary Cardilogist: Dr. Debera HF Cardiologist: Dr. Cherrie  Chief complaint: HF  HPI: Mr Craig Hanson is a 53 y.o. male with a hx of EtOH abuse, chronic systolic HF, and pericardial effusion.    Admitted 02/06/20 with acute heart failure. R/LHC which showed minimal nonobstructive CAD, severe NICM EF <20% and markedly elevated filling pressures w/ preserved CO.1/29, co-ox dropped to 43% and milrinone  added. Diuresed ~60 lb. cMRI showed severe biventricular dysfunction c/w NICM. Discharge weight 283 lbs.  cMRI (8/23) LVEF 31%, RVEF 42%  Follow up 1/24, patient wanted to hold off on ICD. Plan to push GDMT and repeat echo x 6 months.  Echo 11/24 EF 35-40% with septal dyssynchrony. Not interested in ICD.  Today he returns for HF follow up. Says he remains active working in yard and walking. No CP or SOB. Can walk a mile with no problem. No edema, orthopnea or PND. Takes fluids pills as needed.Once a month or so. Compliant with meds. Drinking lite beer. Won't quantify but says he has cut back a lot    Cardiac Studies: Echo 11/24: EF 35-40%, septal dyssynchrony  cMRI (8/23): LVEF 31%, RVEF 42%, no definitive evidence for prior MI, myocarditis, or infiltrative disease.  Echo (3/23): EF 30-35% small effusion   cMRI (2/22): LVEF 17% severe biventricular dysfunction and NICM. Moderate to large effusion. No tamponade.  R/LHC (02/13/20): Ost LAD to Prox LAD lesion is 20% stenosed, Ost Cx to Prox Cx lesion is 20% stenosed.  Ao = 110/84 (95) LV = 106/33 RA =  20 RV = 59/29 PA = 62/20 (43) PCW = 38 Fick cardiac output/index = 5.1/2.3 SVR = 1186 PVR = 1.0 WU FA sat = 96% PA sat = 64%, 68% SVC sat = 66% PAPi = 2.1   Assessment: 1. Minimal nonobstructive CAD 2. 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 Markedly elevated filling pressures with preserved cardiac output   Echo (02/07/20): EF 20-25%, global hypokinesis, grade III dd, severe RV  dysfunction, mod LAE, moderate pericardial effusion  ROS: All systems negative except as listed in HPI, PMH and Problem List.  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  Tobacco Use   Smoking status: Never   Smokeless tobacco: Never  Vaping Use   Vaping status: Never Used  Substance and Sexual Activity   Alcohol use: Not Currently    Alcohol/week: 14.0 - 21.0 standard drinks of alcohol    Types: 14 - 21 Cans of beer per week    Comment: 2-3 shots of liquor a few times a week   Drug use: Never   Sexual activity: Yes  Other Topics Concern   Not on file  Social History Narrative   Not on file   Social Drivers of Health   Financial Resource Strain: Low Risk  (08/15/2023)   Overall Financial Resource Strain (CARDIA)    Difficulty of Paying Living Expenses: Not hard at all  Food Insecurity: No Food Insecurity (08/15/2023)   Hunger Vital Sign    Worried About Running Out of Food in the Last Year: Never true    Ran Out of Food in the Last Year: Never true  Transportation Needs:  No Transportation Needs (08/15/2023)   PRAPARE - Administrator, Civil Service (Medical): No    Lack of Transportation (Non-Medical): No  Physical Activity: Sufficiently Active (08/15/2023)   Exercise Vital Sign    Days of Exercise per Week: 7 days    Minutes of Exercise per Session: 30 min  Stress: No Stress Concern Present (08/15/2023)   [employer] of Occupational Health - Occupational Stress Questionnaire    Feeling of Stress: Not at all  Social Connections: Moderately Isolated (08/15/2023)   Social Connection and Isolation Panel    Frequency of Communication with Friends and Family: More than three times a week    Frequency of Social Gatherings with  Friends and Family: More than three times a week    Attends Religious Services: Never    Database administrator or Organizations: No    Attends Banker Meetings: Never    Marital Status: Married  Catering manager Violence: Not At Risk (08/15/2023)   Humiliation, Afraid, Rape, and Kick questionnaire    Fear of Current or Ex-Partner: No    Emotionally Abused: No    Physically Abused: No    Sexually Abused: No   FH:  Family History  Problem Relation Age of Onset   Hypertension Mother    Heart disease Father    Stroke Father    Colon cancer Neg Hx    Colonic polyp Neg Hx    Past Medical History:  Diagnosis Date   Acute CHF (congestive heart failure) (HCC) 02/06/2020   Asthma    GERD (gastroesophageal reflux disease)    Pericardial effusion    Systolic HF (heart failure) (HCC)    Current Outpatient Medications  Medication Sig Dispense Refill   atorvastatin  (LIPITOR) 80 MG tablet Take 1 tablet (80 mg total) by mouth daily. 90 tablet 3   carvedilol  (COREG ) 25 MG tablet Take 1 tablet (25 mg total) by mouth 2 (two) times daily. 60 tablet 11   famotidine  (PEPCID ) 20 MG tablet Take 1 tablet (20 mg total) by mouth daily as needed for heartburn or indigestion. 30 tablet 5   furosemide  (LASIX ) 40 MG tablet Take 1 tablet (40 mg total) by mouth as needed. For weight gain of 3lb in 24 hours or 5lb in a week 90 tablet 1   hydrALAZINE  (APRESOLINE ) 25 MG tablet Take 1 tablet (25 mg total) by mouth in the morning and at bedtime. 180 tablet 3   JARDIANCE  10 MG TABS tablet TAKE 1 TABLET BY MOUTH DAILY BEFORE BREAKFAST. 30 tablet 11   potassium chloride  SA (KLOR-CON  M) 20 MEQ tablet TAKE 2 TABLETS BY MOUTH DAILY 60 tablet 1   sacubitril -valsartan  (ENTRESTO ) 97-103 MG Take 1 tablet by mouth 2 (two) times daily. 60 tablet 11   spironolactone  (ALDACTONE ) 25 MG tablet TAKE 1 TABLET (25 MG TOTAL) BY MOUTH DAILY. 90 tablet 3   No current facility-administered medications for this encounter.    BP (!) 144/88   Pulse 89   Wt 100.1 kg (220 lb 9.6 oz)   SpO2 96%   BMI 34.04 kg/m   Wt Readings from Last 3 Encounters:  10/30/23 100.1 kg (220 lb 9.6 oz)  08/15/23 99.8 kg (220 lb)  05/01/23 102 kg (224 lb 12.8 oz)   PHYSICAL EXAM: General:  Well appearing. No resp difficulty HEENT: normal Neck: supple. no JVD. Carotids 2+ bilat; no bruits. No lymphadenopathy or thryomegaly appreciated. Cor: PMI nondisplaced. Regular rate & rhythm. No rubs, gallops or  murmurs. Lungs: clear Abdomen: obese soft, nontender, nondistended. No hepatosplenomegaly. No bruits or masses. Good bowel sounds. Extremities: no cyanosis, clubbing, rash, edema Neuro: alert & orientedx3, cranial nerves grossly intact. moves all 4 extremities w/o difficulty. Affect pleasant   ECG: NSR 92 LBBB 142 ms Personally reviewed  ASSESSMENT & PLAN: 1. Chronic systolic HF with severe biventricular dysfunction due to NICM - Echo (1/22):EF 20% severe RV dysfunction moderate pericardial effusion, suspect ETOH related +/- LBBB - Cath (02/13/20): with minimal CAD and massively volume overload. - cMRI (2/22): LVEF 17% RV Severe dysfunction, ? Acute myocarditis possible sarcoid. Wall thinning.   - Echo (07/09/20): EF 25-30% - Echo (3/23): EF 30-35% small effusion  - cMRI (8/23): LVEF 31%, RVEF 42%. No definitive evidence for prior MI, myocarditis, or infiltrative disease - Echo 12/11/22  EF ~35-40% with septal dyssynchrony Personally reviewed - NYHA I-II Volume ok Takes lasix  40 mg PRN  - Increase hydral to 50 bid (unlikely to take tid). Off imdur  due to HAs - Continue Coreg  25 mg bid. - Continue Entresto  97/103 bid - Continue spironolactone  25 mg daily. - Continue Jardiance  10 mg daily. - He is not interested in ICD. If becomes more symptomatic and EF < 35% can consider CRT-D in setting of LBBB. We discussed role of CRT-D in detail. We have decided to repeat echo If EF dropping and/or he develops symptoms we will reconsider.   - Labs today   2. Pericardial effusion. - Small on echo 3/23 - Small to moderate on cMRI 8/23 - Trivial on echo 11/24   3. Chest pain - LHC 2022 minimal CAD - No CP - Continue statin   4. HTN  - BP remains elevated - Increase hydralazine  to 50 bid (unlikely to take tid)  5. ETOH abuse - Has cut way back.  - Discussed cessation  6. Snoring/daytime sleepiness - arrange sleep study   Toribio Fuel, MD  12:14 PM

## 2023-10-30 NOTE — Addendum Note (Signed)
 Encounter addended by: Marcelina Lisa HERO, RN on: 10/30/2023 12:45 PM  Actions taken: Order list changed, Diagnosis association updated, Clinical Note Signed, Charge Capture section accepted

## 2023-10-30 NOTE — Patient Instructions (Addendum)
 INCREASE Hydralazine  to 50 mg Twice daily  Labs done today, your results will be available in MyChart, we will contact you for abnormal readings.  Your physician has requested that you have an echocardiogram. Echocardiography is a painless test that uses sound waves to create images of your heart. It provides your doctor with information about the size and shape of your heart and how well your heart's chambers and valves are working. This procedure takes approximately one hour. There are no restrictions for this procedure. Please do NOT wear cologne, perfume, aftershave, or lotions (deodorant is allowed). Please arrive 15 minutes prior to your appointment time.  Please note: We ask at that you not bring children with you during ultrasound (echo/ vascular) testing. Due to room size and safety concerns, children are not allowed in the ultrasound rooms during exams. Our front office staff cannot provide observation of children in our lobby area while testing is being conducted. An adult accompanying a patient to their appointment will only be allowed in the ultrasound room at the discretion of the ultrasound technician under special circumstances. We apologize for any inconvenience.  Your physician recommends that you schedule a follow-up appointment in: 6 months ( April 2026) ** PLEASE CALL THE OFFICE IN FEBRUARY 2026 TO ARRANGE YOUR FOLLOW UP APPOINTMENT.**  If you have any questions or concerns before your next appointment please send us  a message through [place] or call our office at [phone].    TO LEAVE A MESSAGE FOR THE NURSE SELECT OPTION 2, PLEASE LEAVE A MESSAGE INCLUDING: YOUR NAME DATE OF BIRTH CALL BACK NUMBER REASON FOR CALL**this is important as we prioritize the call backs  YOU WILL RECEIVE A CALL BACK THE SAME DAY AS LONG AS YOU CALL BEFORE 4:00 PM  At the [facility], you and your health needs are our priority. As part of our continuing mission to provide  you with exceptional heart care, we have created designated Provider Care Teams. These Care Teams include your primary Cardiologist (physician) and Advanced Practice Providers (APPs- Physician Assistants and Nurse Practitioners) who all work together to provide you with the care you need, when you need it.   You may see any of the following providers on your designated Care Team at your next follow up: Dr Toribio Fuel Dr Ezra Shuck Dr. Ria Commander Dr. Morene Brownie Amy Lenetta, NP Caffie Shed, [place] [facility] [place], [place] Beckey Coe, NP [place] Lee, NP Ellouise Class, NP Tinnie Redman, PharmD Jaun Bash, PharmD   Please be sure to bring in all your medications bottles to every appointment.    Thank you for choosing  [facility]

## 2023-11-02 ENCOUNTER — Encounter: Payer: Self-pay | Admitting: Internal Medicine

## 2023-11-02 ENCOUNTER — Ambulatory Visit: Payer: Self-pay | Admitting: Internal Medicine

## 2023-11-02 VITALS — BP 138/88 | HR 71 | Ht 67.5 in | Wt 224.0 lb

## 2023-11-02 DIAGNOSIS — I5022 Chronic systolic (congestive) heart failure: Secondary | ICD-10-CM | POA: Diagnosis not present

## 2023-11-02 DIAGNOSIS — Z7984 Long term (current) use of oral hypoglycemic drugs: Secondary | ICD-10-CM | POA: Diagnosis not present

## 2023-11-02 DIAGNOSIS — E782 Mixed hyperlipidemia: Secondary | ICD-10-CM | POA: Diagnosis not present

## 2023-11-02 DIAGNOSIS — K219 Gastro-esophageal reflux disease without esophagitis: Secondary | ICD-10-CM | POA: Diagnosis not present

## 2023-11-02 DIAGNOSIS — Z125 Encounter for screening for malignant neoplasm of prostate: Secondary | ICD-10-CM

## 2023-11-02 DIAGNOSIS — Z23 Encounter for immunization: Secondary | ICD-10-CM | POA: Diagnosis not present

## 2023-11-02 DIAGNOSIS — R7303 Prediabetes: Secondary | ICD-10-CM

## 2023-11-02 DIAGNOSIS — I10 Essential (primary) hypertension: Secondary | ICD-10-CM

## 2023-11-02 NOTE — Assessment & Plan Note (Signed)
 Epigastric discomfort and intermittent nausea could be due to gastritis Prescribed Pepcid  as needed for acid reflux in the last visit

## 2023-11-02 NOTE — Assessment & Plan Note (Signed)
 Lab Results  Component Value Date   HGBA1C 5.7 (H) 04/21/2022   Advised to follow DASH diet

## 2023-11-02 NOTE — Progress Notes (Signed)
 Established Patient Office Visit  Subjective:  Patient ID: Craig Hanson, male    DOB: October 25, 1970  Age: 53 y.o. MRN: [id number]  CC:  Chief Complaint  Patient presents with   Hyperlipidemia    Six month follow up   Congestive Heart Failure    Six month follow up    HPI Craig Hanson is a 53 y.o. male with past medical history of HFrEF who presents for f/u of his chronic medical conditions.  He has been doing well overall. His BP is slightly elevated today.  He is currently taking Coreg  25 mg twice daily, Entresto  97-103 mg BID and spironolactone  25 mg QD.  He was recently placed on hydralazine  50 mg 2 times daily, but is currently taking 25 mg BID.  He had headache from Imdur  in the past and was also having dizziness, and stopped taking them after a month.  Denies any recent worsening of leg swelling or dyspnea. Denies any chest pain, dizziness or palpitations currently. Followed by CHF clinic.  His weight has been stable since the last visit. He has stopped taking alcoholic beverages.   Past Medical History:  Diagnosis Date   Acute CHF (congestive heart failure) (HCC) 02/06/2020   Asthma    GERD (gastroesophageal reflux disease)    Pericardial effusion    Systolic HF (heart failure) (HCC)     Past Surgical History:  Procedure Laterality Date   COLONOSCOPY WITH PROPOFOL  N/A 10/31/2021   Procedure: COLONOSCOPY WITH PROPOFOL ;  Surgeon: Cindie Carlin POUR, DO;  Location: [facility];  Service: Endoscopy;  Laterality: N/A;  8:30am, asa 3   No prior surgery     RIGHT/LEFT HEART CATH AND CORONARY ANGIOGRAPHY N/A 02/13/2020   Procedure: RIGHT/LEFT HEART CATH AND CORONARY ANGIOGRAPHY;  Surgeon: Cherrie Toribio SAUNDERS, MD;  Location: [facility];  Service: Cardiovascular;  Laterality: N/A;    Family History  Problem Relation Age of Onset   Hypertension Mother    Heart disease Father    Stroke Father    Colon cancer Neg Hx    Colonic polyp Neg Hx     Social  History   Socioeconomic History   Marital status: Married    Spouse name: Not on file   Number of children: Not on file   Years of education: Not on file   Highest education level: Not on file  Occupational History    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  Tobacco Use   Smoking status: Never   Smokeless tobacco: Never  Vaping Use   Vaping status: Never Used  Substance and Sexual Activity   Alcohol use: Not Currently    Alcohol/week: 14.0 - 21.0 standard drinks of alcohol    Types: 14 - 21 Cans of beer per week    Comment: 2-3 shots of liquor a few times a week   Drug use: Never   Sexual activity: Yes  Other Topics Concern   Not on file  Social History Narrative   Not on file   Social Drivers of Health   Financial Resource Strain: Low Risk  (08/15/2023)   Overall Financial Resource Strain (CARDIA)    Difficulty of Paying Living Expenses: Not hard at all  Food Insecurity: No Food Insecurity (08/15/2023)   Hunger Vital Sign    Worried About Running Out of Food in the Last Year: Never true    Ran Out of Food in the Last Year: Never true  Transportation Needs: No Transportation Needs (08/15/2023)  PRAPARE - Administrator, Civil Service (Medical): No    Lack of Transportation (Non-Medical): No  Physical Activity: Sufficiently Active (08/15/2023)   Exercise Vital Sign    Days of Exercise per Week: 7 days    Minutes of Exercise per Session: 30 min  Stress: No Stress Concern Present (08/15/2023)   [employer] of Occupational Health - Occupational Stress Questionnaire    Feeling of Stress: Not at all  Social Connections: Moderately Isolated (08/15/2023)   Social Connection and Isolation Panel    Frequency of Communication with Friends and Family: More than three times a week    Frequency of Social Gatherings with Friends and Family: More than three times a week    Attends Religious Services: Never    Database administrator or Organizations: No     Attends Banker Meetings: Never    Marital Status: Married  Catering manager Violence: Not At Risk (08/15/2023)   Humiliation, Afraid, Rape, and Kick questionnaire    Fear of Current or Ex-Partner: No    Emotionally Abused: No    Physically Abused: No    Sexually Abused: No    Outpatient Medications Prior to Visit  Medication Sig Dispense Refill   atorvastatin  (LIPITOR) 80 MG tablet Take 1 tablet (80 mg total) by mouth daily. 90 tablet 3   carvedilol  (COREG ) 25 MG tablet Take 1 tablet (25 mg total) by mouth 2 (two) times daily. 60 tablet 11   famotidine  (PEPCID ) 20 MG tablet Take 1 tablet (20 mg total) by mouth daily as needed for heartburn or indigestion. 30 tablet 5   furosemide  (LASIX ) 40 MG tablet Take 1 tablet (40 mg total) by mouth as needed. For weight gain of 3lb in 24 hours or 5lb in a week 90 tablet 1   hydrALAZINE  (APRESOLINE ) 50 MG tablet Take 1 tablet (50 mg total) by mouth in the morning and at bedtime. 180 tablet 3   JARDIANCE  10 MG TABS tablet TAKE 1 TABLET BY MOUTH DAILY BEFORE BREAKFAST. 30 tablet 11   potassium chloride  SA (KLOR-CON  M) 20 MEQ tablet TAKE 2 TABLETS BY MOUTH DAILY 60 tablet 1   sacubitril -valsartan  (ENTRESTO ) 97-103 MG Take 1 tablet by mouth 2 (two) times daily. 60 tablet 11   spironolactone  (ALDACTONE ) 25 MG tablet TAKE 1 TABLET (25 MG TOTAL) BY MOUTH DAILY. 90 tablet 3   No facility-administered medications prior to visit.    No Known Allergies  ROS Review of Systems  Constitutional:  Negative for chills and fever.  HENT:  Negative for congestion and sore throat.   Eyes:  Negative for pain and discharge.  Respiratory:  Negative for cough and shortness of breath.   Cardiovascular:  Negative for chest pain and palpitations.  Gastrointestinal:  Negative for constipation, diarrhea, nausea and vomiting.  Endocrine: Negative for polydipsia and polyuria.  Genitourinary:  Negative for dysuria and hematuria.  Musculoskeletal:  Positive for  arthralgias (B/l wrist and hand joints with swelling). Negative for neck pain and neck stiffness.  Skin:  Negative for rash.  Neurological:  Negative for dizziness, weakness, numbness and headaches.  Psychiatric/Behavioral:  Negative for agitation and behavioral problems.       Objective:    Physical Exam Vitals reviewed.  Constitutional:      General: He is not in acute distress.    Appearance: He is not diaphoretic.  HENT:     Head: Normocephalic and atraumatic.     Nose: Nose normal.  Mouth/Throat:     Mouth: Mucous membranes are moist.  Eyes:     General: No scleral icterus.    Extraocular Movements: Extraocular movements intact.  Cardiovascular:     Rate and Rhythm: Normal rate and regular rhythm.     Heart sounds: No murmur heard. Pulmonary:     Breath sounds: Normal breath sounds. No wheezing or rales.  Musculoskeletal:     Right hand: Swelling (PIP and DIP joints) present.     Left hand: Swelling (PIP and DIP joints) present.     Cervical back: Neck supple. No tenderness.     Right lower leg: No edema.     Left lower leg: No edema.  Skin:    General: Skin is warm.     Findings: No rash.  Neurological:     General: No focal deficit present.     Mental Status: He is alert and oriented to person, place, and time.     Sensory: No sensory deficit.     Motor: No weakness.  Psychiatric:        Mood and Affect: Mood normal.        Behavior: Behavior normal.     BP 138/88 (BP Location: Left Arm)   Pulse 71   Ht 5' 7.5 (1.715 m)   Wt 224 lb (101.6 kg)   SpO2 100%   BMI 34.57 kg/m  Wt Readings from Last 3 Encounters:  11/02/23 224 lb (101.6 kg)  10/30/23 220 lb 9.6 oz (100.1 kg)  08/15/23 220 lb (99.8 kg)    Lab Results  Component Value Date   TSH 2.560 04/21/2022   Lab Results  Component Value Date   WBC 7.7 04/21/2022   HGB 14.2 04/21/2022   HCT 41.6 04/21/2022   MCV 92 04/21/2022   PLT 214 04/21/2022   Lab Results  Component Value Date    NA 133 (L) 10/30/2023   K 4.3 10/30/2023   CO2 23 10/30/2023   GLUCOSE 106 (H) 10/30/2023   BUN 8 10/30/2023   CREATININE 1.26 (H) 10/30/2023   BILITOT 0.6 10/30/2023   ALKPHOS 60 10/30/2023   AST 25 10/30/2023   ALT 25 10/30/2023   PROT 7.5 10/30/2023   ALBUMIN 4.1 10/30/2023   CALCIUM  9.4 10/30/2023   ANIONGAP 11 10/30/2023   EGFR 79 10/30/2022   Lab Results  Component Value Date   CHOL 278 (H) 04/10/2023   Lab Results  Component Value Date   HDL 40 (L) 04/10/2023   Lab Results  Component Value Date   LDLCALC 173 (H) 04/10/2023   Lab Results  Component Value Date   TRIG 326 (H) 04/10/2023   Lab Results  Component Value Date   CHOLHDL 7.0 04/10/2023   Lab Results  Component Value Date   HGBA1C 5.7 (H) 04/21/2022      Assessment & Plan:   Problem List Items Addressed This Visit       Cardiovascular and Mediastinum   Chronic systolic heart failure (HCC)   Followed by CHF clinic - last visit note reviewed On Entresto , Coreg , Jardiance , spironolactone  and as needed Lasix  Did not tolerate Imdur  and Hydralazine  together Appears to be euvolemic currently Last echo showed LVEF 30-35%      Relevant Orders   CMP14+EGFR   CBC with Differential/Platelet   TSH + free T4   Essential hypertension   BP Readings from Last 1 Encounters:  11/02/23 138/88   Well-controlled with Coreg  25 mg twice daily, Entresto  97-103 mg twice  daily and spironolactone  25 mg once daily Needs to start taking hydralazine  50 mg BID instead of 25 mg Had headache with Imdur  Counseled for compliance with the medications Advised DASH diet and moderate exercise/walking as tolerated      Relevant Orders   CMP14+EGFR   CBC with Differential/Platelet   TSH + free T4     Digestive   Gastroesophageal reflux disease   Epigastric discomfort and intermittent nausea could be due to gastritis Prescribed Pepcid  as needed for acid reflux in the last visit        Other   Mixed  hyperlipidemia   Last lipid profile showed elevated LDL - had run out of Lipitor On Lipitor 80 mg once daily, needs to be compliant Recheck lipid profile      Relevant Orders   Lipid Profile   Prediabetes   Lab Results  Component Value Date   HGBA1C 5.7 (H) 04/21/2022   Advised to follow DASH diet      Relevant Orders   Hemoglobin A1c   Prostate cancer screening   Ordered PSA after discussing its limitations for prostate cancer screening, including false positive results leading to additional investigations.      Relevant Orders   PSA   Other Visit Diagnoses       Encounter for immunization    -  Primary   Relevant Orders   Flu vaccine trivalent PF, 6mos and older(Flulaval,Afluria,Fluarix,Fluzone) (Completed)         No orders of the defined types were placed in this encounter.   Follow-up: Return in about 6 months (around 05/02/2024) for Annual physical (after 05/01/24).    Suzzane MARLA Blanch, MD

## 2023-11-02 NOTE — Assessment & Plan Note (Signed)
 Ordered PSA after discussing its limitations for prostate cancer screening, including false positive results leading to additional investigations.

## 2023-11-02 NOTE — Assessment & Plan Note (Addendum)
 Followed by CHF clinic - last visit note reviewed On Entresto , Coreg , Jardiance , spironolactone  and as needed Lasix  Did not tolerate Imdur  and Hydralazine  together Appears to be euvolemic currently Last echo showed LVEF 30-35%

## 2023-11-02 NOTE — Assessment & Plan Note (Addendum)
 Last lipid profile showed elevated LDL - had run out of Lipitor On Lipitor 80 mg once daily, needs to be compliant Recheck lipid profile

## 2023-11-02 NOTE — Assessment & Plan Note (Addendum)
 BP Readings from Last 1 Encounters:  11/02/23 138/88   Well-controlled with Coreg  25 mg twice daily, Entresto  97-103 mg twice daily and spironolactone  25 mg once daily Needs to start taking hydralazine  50 mg BID instead of 25 mg Had headache with Imdur  Counseled for compliance with the medications Advised DASH diet and moderate exercise/walking as tolerated

## 2023-11-02 NOTE — Patient Instructions (Signed)
 Please start taking Hydralazine  50 mg twice daily.  Please continue to take medications as prescribed.  Please continue to follow low salt diet and perform moderate exercise/walking at least 150 mins/week.

## 2023-11-06 ENCOUNTER — Telehealth: Payer: Self-pay

## 2023-11-06 NOTE — Telephone Encounter (Signed)
 Copied from CRM #[id number]. Topic: General - Deceased Patient >> 11/11/23 12:53 PM Cleave MATSU wrote: Name of caller: nanetta  Date of death: 2023/11/10   Name of funeral home: [employer] [facility] [employer] [facility]  Phone number of funeral home: [phone]  Provider that needs to sign form: Dr.Patel needs to sign death certificate  Timeline for signing: ASAP

## 2023-11-07 ENCOUNTER — Telehealth: Payer: Self-pay | Admitting: Internal Medicine

## 2023-11-07 NOTE — Telephone Encounter (Signed)
 Chart updated

## 2023-11-07 NOTE — Telephone Encounter (Signed)
 Copied from CRM #[id number]. Topic: General - Deceased Patient >> 10-Nov-2023  1:04 PM Tinnie BROCKS wrote: Nanetta from [facility] calling to provide time of death for patient at Dr. Christopher  request. Time of death is 18:09.

## 2023-11-07 NOTE — Telephone Encounter (Signed)
 Copied from CRM [phone]. Topic: General - Other >> Nov 06, 2023  5:16 PM Sophia H wrote: Reason for CRM: [facility] - returning missed phone call. CAL closed for evening. Requesting call back around 10a 10/22. States he is in preparation room right now and unable to access patients time of death - [phone]

## 2023-11-17 DEATH — deceased

## 2023-11-19 ENCOUNTER — Other Ambulatory Visit: Payer: Self-pay | Admitting: Internal Medicine

## 2023-11-19 DIAGNOSIS — K219 Gastro-esophageal reflux disease without esophagitis: Secondary | ICD-10-CM

## 2023-11-21 ENCOUNTER — Ambulatory Visit (HOSPITAL_COMMUNITY)

## 2024-05-09 ENCOUNTER — Encounter: Admitting: Internal Medicine

## 2024-08-18 ENCOUNTER — Ambulatory Visit
